# Patient Record
Sex: Male | Born: 1970
Health system: Southern US, Community
[De-identification: ages and names within clinical notes are randomized; demographics above are authoritative.]

## PROBLEM LIST (undated history)

## (undated) ENCOUNTER — Ambulatory Visit: Admission: EM | Payer: 59 | Source: Home / Self Care

## (undated) DIAGNOSIS — K5900 Constipation, unspecified: Secondary | ICD-10-CM

## (undated) DIAGNOSIS — I499 Cardiac arrhythmia, unspecified: Secondary | ICD-10-CM

## (undated) DIAGNOSIS — K4090 Unilateral inguinal hernia, without obstruction or gangrene, not specified as recurrent: Secondary | ICD-10-CM

## (undated) DIAGNOSIS — N400 Enlarged prostate without lower urinary tract symptoms: Secondary | ICD-10-CM

## (undated) DIAGNOSIS — R519 Headache, unspecified: Secondary | ICD-10-CM

## (undated) DIAGNOSIS — R51 Headache: Secondary | ICD-10-CM

## (undated) DIAGNOSIS — M419 Scoliosis, unspecified: Secondary | ICD-10-CM

## (undated) DIAGNOSIS — R300 Dysuria: Secondary | ICD-10-CM

## (undated) HISTORY — PX: HERNIA REPAIR: SHX51

## (undated) HISTORY — DX: Unilateral inguinal hernia, without obstruction or gangrene, not specified as recurrent: K40.90

## (undated) HISTORY — PX: UMBILICAL HERNIA REPAIR: SHX196

## (undated) HISTORY — PX: TONSILLECTOMY: SUR1361

---

## 1996-11-25 HISTORY — PX: HERNIA REPAIR: SHX51

## 2011-12-08 ENCOUNTER — Emergency Department: Payer: Self-pay | Admitting: Emergency Medicine

## 2012-12-19 ENCOUNTER — Emergency Department: Payer: Self-pay | Admitting: Emergency Medicine

## 2013-03-15 ENCOUNTER — Emergency Department: Payer: Self-pay | Admitting: Emergency Medicine

## 2013-03-15 LAB — BASIC METABOLIC PANEL
Anion Gap: 4 — ABNORMAL LOW (ref 7–16)
BUN: 18 mg/dL (ref 7–18)
Calcium, Total: 8.8 mg/dL (ref 8.5–10.1)
Chloride: 107 mmol/L (ref 98–107)
Co2: 28 mmol/L (ref 21–32)
Creatinine: 1.39 mg/dL — ABNORMAL HIGH (ref 0.60–1.30)
EGFR (African American): >60
EGFR (Non-African Amer.): >60
Glucose: 56 mg/dL — ABNORMAL LOW (ref 65–99)
Osmolality: 277 (ref 275–301)
Potassium: 3.9 mmol/L (ref 3.5–5.1)
Sodium: 139 mmol/L (ref 136–145)

## 2013-03-15 LAB — CBC
HCT: 48.3 % (ref 40.0–52.0)
HGB: 15.8 g/dL (ref 13.0–18.0)
MCH: 27.7 pg (ref 26.0–34.0)
MCHC: 32.7 g/dL (ref 32.0–36.0)
MCV: 85 fL (ref 80–100)
Platelet: 227 10*3/uL (ref 150–440)
RBC: 5.7 10*6/uL (ref 4.40–5.90)
RDW: 13.9 % (ref 11.5–14.5)
WBC: 6.1 10*3/uL (ref 3.8–10.6)

## 2013-03-15 LAB — CK TOTAL AND CKMB (NOT AT ARMC)
CK, Total: 203 U/L (ref 35–232)
CK-MB: 1.8 ng/mL (ref 0.5–3.6)

## 2013-03-15 LAB — TROPONIN I
Troponin-I: <0.02 ng/mL
Troponin-I: <0.02 ng/mL

## 2014-02-22 ENCOUNTER — Emergency Department: Payer: Self-pay | Admitting: Emergency Medicine

## 2014-02-22 LAB — COMPREHENSIVE METABOLIC PANEL
Albumin: 4 g/dL (ref 3.4–5.0)
Alkaline Phosphatase: 57 U/L
Anion Gap: 6 — ABNORMAL LOW (ref 7–16)
BUN: 15 mg/dL (ref 7–18)
Bilirubin,Total: 0.5 mg/dL (ref 0.2–1.0)
Calcium, Total: 8.6 mg/dL (ref 8.5–10.1)
Chloride: 106 mmol/L (ref 98–107)
Co2: 26 mmol/L (ref 21–32)
Creatinine: 1.23 mg/dL (ref 0.60–1.30)
EGFR (African American): >60
EGFR (Non-African Amer.): >60
Glucose: 83 mg/dL (ref 65–99)
Osmolality: 276 (ref 275–301)
Potassium: 3.9 mmol/L (ref 3.5–5.1)
SGOT(AST): 22 U/L (ref 15–37)
SGPT (ALT): 22 U/L (ref 12–78)
Sodium: 138 mmol/L (ref 136–145)
Total Protein: 7.6 g/dL (ref 6.4–8.2)

## 2014-02-22 LAB — URINALYSIS, COMPLETE
Bacteria: NONE SEEN
Bilirubin,UR: NEGATIVE
Blood: NEGATIVE
Glucose,UR: NEGATIVE mg/dL (ref 0–75)
Leukocyte Esterase: NEGATIVE
Nitrite: NEGATIVE
Ph: 6 (ref 4.5–8.0)
Protein: NEGATIVE
RBC,UR: NONE SEEN /HPF (ref 0–5)
Specific Gravity: 1.006 (ref 1.003–1.030)
Squamous Epithelial: NONE SEEN
WBC UR: NONE SEEN /HPF (ref 0–5)

## 2014-02-22 LAB — CBC
HCT: 48.6 % (ref 40.0–52.0)
HGB: 15.7 g/dL (ref 13.0–18.0)
MCH: 27.2 pg (ref 26.0–34.0)
MCHC: 32.2 g/dL (ref 32.0–36.0)
MCV: 84 fL (ref 80–100)
Platelet: 241 10*3/uL (ref 150–440)
RBC: 5.76 10*6/uL (ref 4.40–5.90)
RDW: 13.9 % (ref 11.5–14.5)
WBC: 5.2 10*3/uL (ref 3.8–10.6)

## 2014-09-30 ENCOUNTER — Emergency Department: Payer: Self-pay | Admitting: Emergency Medicine

## 2014-09-30 LAB — URINALYSIS, COMPLETE
Bacteria: NONE SEEN
Bilirubin,UR: NEGATIVE
Blood: NEGATIVE
Glucose,UR: NEGATIVE mg/dL (ref 0–75)
Ketone: NEGATIVE
Leukocyte Esterase: NEGATIVE
Nitrite: NEGATIVE
Ph: 6 (ref 4.5–8.0)
Protein: NEGATIVE
RBC,UR: <1 /HPF (ref 0–5)
Specific Gravity: 1.006 (ref 1.003–1.030)
Squamous Epithelial: NONE SEEN
WBC UR: NONE SEEN /HPF (ref 0–5)

## 2014-09-30 LAB — COMPREHENSIVE METABOLIC PANEL
Albumin: 3.9 g/dL (ref 3.4–5.0)
Alkaline Phosphatase: 61 U/L
Anion Gap: 6 — ABNORMAL LOW (ref 7–16)
BUN: 13 mg/dL (ref 7–18)
Bilirubin,Total: 0.4 mg/dL (ref 0.2–1.0)
Calcium, Total: 8.7 mg/dL (ref 8.5–10.1)
Chloride: 107 mmol/L (ref 98–107)
Co2: 27 mmol/L (ref 21–32)
Creatinine: 1.38 mg/dL — ABNORMAL HIGH (ref 0.60–1.30)
EGFR (African American): >60
EGFR (Non-African Amer.): 60 — ABNORMAL LOW
Glucose: 92 mg/dL (ref 65–99)
Osmolality: 279 (ref 275–301)
Potassium: 3.8 mmol/L (ref 3.5–5.1)
SGOT(AST): 21 U/L (ref 15–37)
SGPT (ALT): 24 U/L
Sodium: 140 mmol/L (ref 136–145)
Total Protein: 7.8 g/dL (ref 6.4–8.2)

## 2014-09-30 LAB — CBC WITH DIFFERENTIAL/PLATELET
Basophil #: 0 10*3/uL (ref 0.0–0.1)
Basophil %: 0.7 %
Eosinophil #: 0.1 10*3/uL (ref 0.0–0.7)
Eosinophil %: 1.5 %
HCT: 50.1 % (ref 40.0–52.0)
HGB: 16.5 g/dL (ref 13.0–18.0)
Lymphocyte #: 2.1 10*3/uL (ref 1.0–3.6)
Lymphocyte %: 40 %
MCH: 28.1 pg (ref 26.0–34.0)
MCHC: 32.8 g/dL (ref 32.0–36.0)
MCV: 86 fL (ref 80–100)
Monocyte #: 0.8 x10 3/mm (ref 0.2–1.0)
Monocyte %: 14.9 %
Neutrophil #: 2.3 10*3/uL (ref 1.4–6.5)
Neutrophil %: 42.9 %
Platelet: 253 10*3/uL (ref 150–440)
RBC: 5.85 10*6/uL (ref 4.40–5.90)
RDW: 13.4 % (ref 11.5–14.5)
WBC: 5.4 10*3/uL (ref 3.8–10.6)

## 2014-09-30 LAB — LIPASE, BLOOD: Lipase: 94 U/L (ref 73–393)

## 2014-10-01 LAB — GC/CHLAMYDIA PROBE AMP

## 2015-04-05 ENCOUNTER — Encounter: Payer: Self-pay | Admitting: *Deleted

## 2015-04-05 DIAGNOSIS — Z720 Tobacco use: Secondary | ICD-10-CM | POA: Insufficient documentation

## 2015-04-05 DIAGNOSIS — R0789 Other chest pain: Secondary | ICD-10-CM | POA: Insufficient documentation

## 2015-04-05 LAB — BASIC METABOLIC PANEL
Anion gap: 7 (ref 5–15)
BUN: 12 mg/dL (ref 6–20)
CO2: 26 mmol/L (ref 22–32)
Calcium: 9.2 mg/dL (ref 8.9–10.3)
Chloride: 108 mmol/L (ref 101–111)
Creatinine, Ser: 1.4 mg/dL — ABNORMAL HIGH (ref 0.61–1.24)
GFR calc Af Amer: >60 mL/min (ref >60)
GFR calc non Af Amer: >60 mL/min (ref >60)
Glucose, Bld: 102 mg/dL — ABNORMAL HIGH (ref 65–99)
Potassium: 3.4 mmol/L — ABNORMAL LOW (ref 3.5–5.1)
Sodium: 141 mmol/L (ref 135–145)

## 2015-04-05 LAB — CBC
HCT: 45.7 % (ref 40.0–52.0)
Hemoglobin: 15.1 g/dL (ref 13.0–18.0)
MCH: 28.3 pg (ref 26.0–34.0)
MCHC: 33 g/dL (ref 32.0–36.0)
MCV: 85.8 fL (ref 80.0–100.0)
Platelets: 213 10*3/uL (ref 150–440)
RBC: 5.33 MIL/uL (ref 4.40–5.90)
RDW: 14.2 % (ref 11.5–14.5)
WBC: 6.2 10*3/uL (ref 3.8–10.6)

## 2015-04-05 LAB — TROPONIN I: Troponin I: <0.03 ng/mL (ref <0.031)

## 2015-04-05 NOTE — ED Notes (Signed)
Pt reports intermittent chest pressure  for several months, cramping in his left hand since yesterday and left calf numbness that started today. All symptoms have been coming and going.

## 2015-04-06 ENCOUNTER — Emergency Department
Admission: EM | Admit: 2015-04-06 | Discharge: 2015-04-06 | Disposition: A | Discharge status: Home / Self Care | Payer: Self-pay | Attending: Emergency Medicine | Admitting: Emergency Medicine

## 2015-04-06 DIAGNOSIS — R079 Chest pain, unspecified: Secondary | ICD-10-CM

## 2015-04-06 DIAGNOSIS — R0789 Other chest pain: Secondary | ICD-10-CM

## 2015-04-06 NOTE — ED Provider Notes (Signed)
[facility] Emergency Department Provider Note  ____________________________________________  Time seen: 4:25 AM  I have reviewed the triage vital signs and the nursing notes.   HISTORY  Chief Complaint Chest Pain      HPI Joseph Strong is a 44 y.o. male presents with intermittent chest discomfort times several months. No chest pain at present no shortness of breath no nausea no dizziness. Patient describes the discomfort as pressure when it does occur. Current pain score 0     History reviewed. No pertinent past medical history.  There are no active problems to display for this patient.   Past Surgical History  Procedure Laterality Date  . Hernia repair      No current outpatient prescriptions on file.  Allergies Review of patient's allergies indicates no known allergies.  No family history on file.  Social History History  Substance Use Topics  . Smoking status: Current Some Day Smoker  . Smokeless tobacco: Not on file  . Alcohol Use: Yes    Review of Systems  Constitutional: Negative for fever. Eyes: Negative for visual changes. ENT: Negative for sore throat. Cardiovascular: Negative for chest pain. Respiratory: Negative for shortness of breath. Gastrointestinal: Negative for abdominal pain, vomiting and diarrhea. Genitourinary: Negative for dysuria. Musculoskeletal: Negative for back pain. Skin: Negative for rash. Neurological: Negative for headaches, focal weakness or numbness.  10-point ROS otherwise negative.  ____________________________________________   PHYSICAL EXAM:  VITAL SIGNS: ED Triage Vitals  Enc Vitals Group     BP 04/05/15 2237 139/80 mmHg     Pulse Rate 04/05/15 2237 56     Resp 04/05/15 2237 16     Temp 04/05/15 2237 97.8 F (36.6 C)     Temp Source 04/05/15 2237 Oral     SpO2 04/05/15 2237 99 %     Weight 04/05/15 2237 180 lb (81.647 kg)     Height 04/05/15 2237 6' (1.829 m)     Head Cir --       Peak Flow --      Pain Score --      Pain Loc --      Pain Edu? --      Excl. in GC? --      Constitutional: Alert and oriented. Well appearing and in no distress. Eyes: Conjunctivae are normal. PERRL. Normal extraocular movements. ENT   Head: Normocephalic and atraumatic.   Nose: No congestion/rhinnorhea.   Mouth/Throat: Mucous membranes are moist.   Neck: No stridor. Cardiovascular: Normal rate, regular rhythm. Normal and symmetric distal pulses are present in all extremities. No murmurs, rubs, or gallops. Respiratory: Normal respiratory effort without tachypnea nor retractions. Breath sounds are clear and equal bilaterally. No wheezes/rales/rhonchi. Gastrointestinal: Soft and nontender. No distention. There is no CVA tenderness. Genitourinary: deferred Musculoskeletal: Nontender with normal range of motion in all extremities. No joint effusions.  No lower extremity tenderness nor edema. Neurologic:  Normal speech and language. No gross focal neurologic deficits are appreciated. Speech is normal.  Skin:  Skin is warm, dry and intact. No rash noted. Psychiatric: Mood and affect are normal. Speech and behavior are normal. Patient exhibits appropriate insight and judgment.  ____________________________________________    LABS (pertinent positives/negatives)  Labs Reviewed  BASIC METABOLIC PANEL - Abnormal; Notable for the following:    Potassium 3.4 (*)    Glucose, Bld 102 (*)    Creatinine, Ser 1.40 (*)    All other components within normal limits  TROPONIN I  CBC  ____________________________________________   EKG   Date: 04/06/2015  Rate: 48  Rhythm: Sinus bradycardia  QRS Axis: normal  Intervals: normal  ST/T Wave abnormalities: normal  Conduction Disutrbances: none  Narrative Interpretation: unremarkable      ____________________________________________        INITIAL IMPRESSION / ASSESSMENT AND PLAN / ED COURSE  Pertinent  labs & imaging results that were available during my care of the patient were reviewed by me and considered in my medical decision making (see chart for details).  History of physical exam, lab data and EKG unremarkable except sinus bradycardia. Patient will be referred to cardiology doctor called for outpatient follow-up  ____________________________________________   FINAL CLINICAL IMPRESSION(S) / ED DIAGNOSES  Final diagnoses:  Chest pain of uncertain etiology      Darci Currentandolph N Brown, MD 04/19/15 [phone]

## 2015-04-06 NOTE — Discharge Instructions (Signed)

## 2015-04-06 NOTE — ED Notes (Signed)
Pt provided with work note as requested stating that he was seen in ED.

## 2015-04-06 NOTE — ED Notes (Signed)
MD Brown at bedside.

## 2015-08-27 ENCOUNTER — Emergency Department
Admission: EM | Admit: 2015-08-27 | Discharge: 2015-08-27 | Disposition: A | Discharge status: Home / Self Care | Payer: PRIVATE HEALTH INSURANCE | Attending: Emergency Medicine | Admitting: Emergency Medicine

## 2015-08-27 ENCOUNTER — Encounter: Payer: Self-pay | Admitting: Emergency Medicine

## 2015-08-27 DIAGNOSIS — Z720 Tobacco use: Secondary | ICD-10-CM | POA: Insufficient documentation

## 2015-08-27 DIAGNOSIS — R6889 Other general symptoms and signs: Secondary | ICD-10-CM | POA: Diagnosis present

## 2015-08-27 DIAGNOSIS — M545 Low back pain, unspecified: Secondary | ICD-10-CM

## 2015-08-27 LAB — COMPREHENSIVE METABOLIC PANEL
ALT: 18 U/L (ref 17–63)
AST: 21 U/L (ref 15–41)
Albumin: 4.1 g/dL (ref 3.5–5.0)
Alkaline Phosphatase: 63 U/L (ref 38–126)
Anion gap: 8 (ref 5–15)
BUN: 12 mg/dL (ref 6–20)
CO2: 24 mmol/L (ref 22–32)
Calcium: 8.9 mg/dL (ref 8.9–10.3)
Chloride: 108 mmol/L (ref 101–111)
Creatinine, Ser: 1.27 mg/dL — ABNORMAL HIGH (ref 0.61–1.24)
GFR calc Af Amer: >60 mL/min (ref >60)
GFR calc non Af Amer: >60 mL/min (ref >60)
Glucose, Bld: 84 mg/dL (ref 65–99)
Potassium: 3.9 mmol/L (ref 3.5–5.1)
Sodium: 140 mmol/L (ref 135–145)
Total Bilirubin: 0.7 mg/dL (ref 0.3–1.2)
Total Protein: 7.1 g/dL (ref 6.5–8.1)

## 2015-08-27 LAB — CBC
HCT: 48.5 % (ref 40.0–52.0)
Hemoglobin: 15.8 g/dL (ref 13.0–18.0)
MCH: 27.6 pg (ref 26.0–34.0)
MCHC: 32.6 g/dL (ref 32.0–36.0)
MCV: 84.6 fL (ref 80.0–100.0)
Platelets: 250 10*3/uL (ref 150–440)
RBC: 5.74 MIL/uL (ref 4.40–5.90)
RDW: 14 % (ref 11.5–14.5)
WBC: 6.1 10*3/uL (ref 3.8–10.6)

## 2015-08-27 LAB — URINALYSIS COMPLETE WITH MICROSCOPIC (ARMC ONLY)
Bilirubin Urine: NEGATIVE
Glucose, UA: NEGATIVE mg/dL
Hgb urine dipstick: NEGATIVE
Ketones, ur: NEGATIVE mg/dL
Leukocytes, UA: NEGATIVE
Nitrite: NEGATIVE
Protein, ur: NEGATIVE mg/dL
Specific Gravity, Urine: 1.02 (ref 1.005–1.030)
Squamous Epithelial / LPF: NONE SEEN
pH: 5 (ref 5.0–8.0)

## 2015-08-27 LAB — LIPASE, BLOOD: Lipase: 24 U/L (ref 22–51)

## 2015-08-27 NOTE — ED Notes (Addendum)
Pt c/o pinching and pain and pressure to the area around his kidneys. Pt states abdominal pain that moves from front to back. NAD noted at this time. Pt states he noted blood in his spit after brushing his teeth. Denies trouble going to the bathroom. Pt also c/o dizziness intermittently. Pt states mold issue where he lives and thinks that he may have been exposed to mold as hell.

## 2015-08-27 NOTE — ED Provider Notes (Signed)
[facility] Emergency Department Provider Note   ____________________________________________  Time seen: 8:30 PM I have reviewed the triage vital signs and the triage nursing note.  HISTORY  Chief Complaint Multiple Medical Complaints    Historian Patient  HPI Joseph Strong is a 44 y.o. male presents to the emergency department for intermittent sharp pains in the left flank and occasionally the right flank. He had had some episodes of these today. He is also concerned about some intermittent lightheadedness which he calls dizziness. There is no vertigo. There is no weakness or numbness. He reports that he had left inguinal hernia repair in the past and occasionally he will have a cramping pain in that area, but not now. He is reports that he now has a right inguinal hernia which does not give him any pain and is easily reducible. He's not had any chest pain or shortness of breath or trouble breathing. No nausea vomiting or abdominal pain. 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 "Patient is somewhat concerned about dust allergy and mold allergy at home. He has not seen a primary care physician but hasn't appointment in about 2 weeks with a new primary care physician.    History reviewed. No pertinent past medical history.  There are no active problems to display for this patient.   Past Surgical History  Procedure Laterality Date  . Hernia repair      No current outpatient prescriptions on file.  Allergies Review of patient's allergies indicates no known allergies.  History reviewed. No pertinent family history.  Social History Social History  Substance Use Topics  . Smoking status: Current Some Day Smoker  . Smokeless tobacco: None  . Alcohol Use: Yes    Review of Systems  Constitutional: Negative for fever. Eyes: Negative for visual changes. ENT: Negative for sore throat. Cardiovascular: Negative for chest  pain. Respiratory: Negative for shortness of breath. Gastrointestinal: Negative for abdominal pain, vomiting and diarrhea. Genitourinary: Negative for dysuria. Musculoskeletal: Negative for back pain. Skin: Negative for rash. Neurological: Negative for headache. 10 point Review of Systems otherwise negative ____________________________________________   PHYSICAL EXAM:  VITAL SIGNS: ED Triage Vitals  Enc Vitals Group     BP 08/27/15 1458 141/84 mmHg     Pulse Rate 08/27/15 1458 81     Resp 08/27/15 1458 20     Temp 08/27/15 1458 97.7 F (36.5 C)     Temp Source 08/27/15 1458 Oral     SpO2 08/27/15 1458 97 %     Weight 08/27/15 1458 180 lb (81.647 kg)     Height 08/27/15 1458 6' (1.829 m)     Head Cir --      Peak Flow --      Pain Score 08/27/15 1458 5     Pain Loc --      Pain Edu? --      Excl. in GC? --      Constitutional: Alert and oriented. Well appearing and in no distress. Eyes: Conjunctivae are normal. PERRL. Normal extraocular movements. ENT   Head: Normocephalic and atraumatic.   Nose: No congestion/rhinnorhea.   Mouth/Throat: Mucous membranes are moist.   Neck: No stridor. Cardiovascular/Chest: Normal rate, regular rhythm.  No murmurs, rubs, or gallops. Respiratory: Normal respiratory effort without tachypnea nor retractions. Breath sounds are clear and equal bilaterally. No wheezes/rales/rhonchi. Gastrointestinal: Soft. No distention, no guarding, no rebound. Nontender   Genitourinary/rectal:Deferred Musculoskeletal: Nontender with normal range of motion  in all extremities. No joint effusions.  No lower extremity tenderness.  No edema. Neurologic:  Normal speech and language. No gross or focal neurologic deficits are appreciated. Skin:  Skin is warm, dry and intact. No rash noted. Psychiatric: Mood and affect are normal. Speech and behavior are normal. Patient exhibits appropriate insight and  judgment.  ____________________________________________   EKG I, Governor Rooks, MD, the attending physician have personally viewed and interpreted all ECGs.  No EKG performed ____________________________________________  LABS (pertinent positives/negatives)  Lipase 24 Comprehensive metabolic panel significant for creatinine 1.27 otherwise within normal limits CBC within normal limits Urinalysis without significant abnormality.  ____________________________________________  RADIOLOGY All Xrays were viewed by me. Imaging interpreted by Radiologist.  None __________________________________________  PROCEDURES  Procedure(s) performed: None  Critical Care performed: None  ____________________________________________   ED COURSE / ASSESSMENT AND PLAN  CONSULTATIONS: None  Pertinent labs & imaging results that were available during my care of the patient were reviewed by me and considered in my medical decision making (see chart for details).   Patient is overall well-appearing with some intermittent complaints, and currently without complaint or pain. Patient mainly wanted to be checked for possible "kidney problems. "His creatinine is minimally elevated, his urine has rare bacteria but without any other signs of urinary tract infection. There is no blood. He is currently symptomatic. He does have a follow-up appointment scheduled with a primary care physician in about 2 weeks.  In terms of this complaint about the hernias, there is no acute complaint. He deferred examination today.  We discussed there are multiple other general screening evaluation such as vitamin D and B12, etc. they can be discussed during his primary care evaluation.  Patient / Family / Caregiver informed of clinical course, medical decision-making process, and agree with plan.   I discussed return precautions, follow-up instructions, and discharged instructions with patient and/or  family.  ___________________________________________   FINAL CLINICAL IMPRESSION(S) / ED DIAGNOSES   Final diagnoses:  Bilateral low back pain without sciatica       Governor Rooks, MD 08/27/15 2056

## 2015-08-27 NOTE — Discharge Instructions (Signed)
No certain cause was found for your intermittent back pain, however your exam and evaluation are reassuring. I suspect her back pains may be related to muscular pain.     Musculoskeletal Pain Musculoskeletal pain is muscle and boney aches and pains. These pains can occur in any part of the body. Your caregiver may treat you without knowing the cause of the pain. They may treat you if blood or urine tests, X-rays, and other tests were normal.  CAUSES There is often not a definite cause or reason for these pains. These pains may be caused by a type of germ (virus). The discomfort may also come from overuse. Overuse includes working out too hard when your body is not fit. Boney aches also come from weather changes. Bone is sensitive to atmospheric pressure changes. HOME CARE INSTRUCTIONS   Ask when your test results will be ready. Make sure you get your test results.  Only take over-the-counter or prescription medicines for pain, discomfort, or fever as directed by your caregiver. If you were given medications for your condition, do not drive, operate machinery or power tools, or sign legal documents for 24 hours. Do not drink alcohol. Do not take sleeping pills or other medications that may interfere with treatment.  Continue all activities unless the activities cause more pain. When the pain lessens, slowly resume normal activities. Gradually increase the intensity and duration of the activities or exercise.  During periods of severe pain, bed rest may be helpful. Lay or sit in any position that is comfortable.  Putting ice on the injured area.  Put ice in a bag.  Place a towel between your skin and the bag.  Leave the ice on for 15 to 20 minutes, 3 to 4 times a day.  Follow up with your caregiver for continued problems and no reason can be found for the pain. If the pain becomes worse or does not go away, it may be necessary to repeat tests or do additional testing. Your caregiver may need to  look further for a possible cause. SEEK IMMEDIATE MEDICAL CARE IF:  You have pain that is getting worse and is not relieved by medications.  You develop chest pain that is associated with shortness or breath, sweating, feeling sick to your stomach (nauseous), or throw up (vomit).  Your pain becomes localized to the abdomen.  You develop any new symptoms that seem different or that concern you. MAKE SURE YOU:   Understand these instructions.  Will watch your condition.  Will get help right away if you are not doing well or get worse. Document Released: 11/11/2005 Document Revised: 02/03/2012 Document Reviewed: 07/16/2013 [facility] Patient Information 2015 [place], [place]. This information is not intended to replace advice given to you by your health care provider. Make sure you discuss any questions you have with your health care provider. We discussed he should follow up with primary care physician as scheduled in the next 2 weeks. Return to the emergency department for any new or worsening condition including any abdominal pain, hernia which does not pushed back in place or is painful, fever, vomiting, black or bloody stools, weakness or numbness, or any other symptoms concerning to you.

## 2015-09-06 ENCOUNTER — Encounter: Payer: Self-pay | Admitting: Family Medicine

## 2015-09-06 ENCOUNTER — Ambulatory Visit (INDEPENDENT_AMBULATORY_CARE_PROVIDER_SITE_OTHER): Payer: PRIVATE HEALTH INSURANCE | Admitting: Family Medicine

## 2015-09-06 VITALS — BP 138/73 | HR 86 | Temp 98.5°F | Resp 19 | Ht 72.0 in | Wt 191.7 lb

## 2015-09-06 DIAGNOSIS — R8271 Bacteriuria: Secondary | ICD-10-CM | POA: Diagnosis not present

## 2015-09-06 DIAGNOSIS — R7989 Other specified abnormal findings of blood chemistry: Secondary | ICD-10-CM

## 2015-09-06 DIAGNOSIS — R748 Abnormal levels of other serum enzymes: Secondary | ICD-10-CM | POA: Diagnosis not present

## 2015-09-06 NOTE — Progress Notes (Signed)
Name: Joseph Strong   MRN: [id number]    DOB: 08/06/1971   Date:09/06/2015       Progress Note  Subjective  Chief Complaint  Chief Complaint  Patient presents with  . Establish Care    NP    HPI  Pt. Is here to establish care. No previous PCP. Last complete physical was 12 years ago. He was recently seen in the ER for multiple complaints. Work up including urinalysis showed some bacteria in the urine. Serum creatinine was minimally elevated at 1.27. Pt. Denies any history of kidney disease or any urinary symptoms.  Past Medical History  Diagnosis Date  . Inguinal hernia     Right side.    Past Surgical History  Procedure Laterality Date  . Tonsillectomy      childhood  . Hernia repair  1998    History reviewed. No pertinent family history.  Social History   Social History  . Marital Status: Single    Spouse Name: N/A  . Number of Children: N/A  . Years of Education: N/A   Occupational History  . Not on file.   Social History Main Topics  . Smoking status: Current Some Day Smoker    Types: Cigarettes  . Smokeless tobacco: Not on file  . Alcohol Use: 0.0 oz/week    0 Standard drinks or equivalent, 0 Glasses of wine per week     Comment: occasional  . Drug Use: No  . Sexual Activity: Yes   Other Topics Concern  . Not on file   Social History Narrative    No current outpatient prescriptions on file.  No Known Allergies   Review of Systems  Constitutional: Negative for fever, chills, weight loss and malaise/fatigue.  Gastrointestinal: Negative for abdominal pain.  Genitourinary: Negative for dysuria, urgency, frequency and hematuria.   Objective  Filed Vitals:   09/06/15 1542  BP: 138/73  Pulse: 86  Temp: 98.5 F (36.9 C)  TempSrc: Oral  Resp: 19  Height: 6' (1.829 m)  Weight: 191 lb 11.2 oz (86.955 kg)  SpO2: 98%    Physical Exam  Constitutional: He is well-developed, well-nourished, and in no distress.  Cardiovascular: Normal rate and  regular rhythm.   Pulmonary/Chest: Effort normal and breath sounds normal.  Abdominal: Soft. Bowel sounds are normal. He exhibits no distension. There is no tenderness. There is no CVA tenderness.  Nursing note and vitals reviewed.   Assessment & Plan  1. Bacteria in urine Obtain urinalysis for evaluation of bacteria in the urine. If present, will need a urine culture for identification and antibiotic susceptibility. - Urinalysis, Routine w reflex microscopic  2. Elevated serum creatinine Recheck serum creatinine. Patient encouraged to increase fluid intake before lab work. - Comprehensive Metabolic Panel (CMET)   Ennifer Harston Asad A. Faylene KurtzShah [facility] 09/06/2015 4:19 PM

## 2015-09-13 ENCOUNTER — Telehealth: Payer: Self-pay | Admitting: Family Medicine

## 2015-09-13 LAB — URINALYSIS, ROUTINE W REFLEX MICROSCOPIC
Bilirubin, UA: NEGATIVE
Glucose, UA: NEGATIVE
Ketones, UA: NEGATIVE
Leukocytes, UA: NEGATIVE
Nitrite, UA: NEGATIVE
Protein, UA: NEGATIVE
RBC, UA: NEGATIVE
Specific Gravity, UA: 1.008 (ref 1.005–1.030)
Urobilinogen, Ur: 1 mg/dL (ref 0.2–1.0)
pH, UA: 6.5 (ref 5.0–7.5)

## 2015-09-13 LAB — COMPREHENSIVE METABOLIC PANEL
ALT: 15 IU/L (ref 0–44)
AST: 18 IU/L (ref 0–40)
Albumin/Globulin Ratio: 1.7 (ref 1.1–2.5)
Albumin: 4.4 g/dL (ref 3.5–5.5)
Alkaline Phosphatase: 61 IU/L (ref 39–117)
BUN/Creatinine Ratio: 7 — ABNORMAL LOW (ref 9–20)
BUN: 8 mg/dL (ref 6–24)
Bilirubin Total: 0.2 mg/dL (ref 0.0–1.2)
CO2: 22 mmol/L (ref 18–29)
Calcium: 9.1 mg/dL (ref 8.7–10.2)
Chloride: 104 mmol/L (ref 97–106)
Creatinine, Ser: 1.17 mg/dL (ref 0.76–1.27)
GFR calc Af Amer: 87 mL/min/{1.73_m2} (ref >59)
GFR calc non Af Amer: 75 mL/min/{1.73_m2} (ref >59)
Globulin, Total: 2.6 g/dL (ref 1.5–4.5)
Glucose: 96 mg/dL (ref 65–99)
Potassium: 4.8 mmol/L (ref 3.5–5.2)
Sodium: 142 mmol/L (ref 136–144)
Total Protein: 7 g/dL (ref 6.0–8.5)

## 2015-09-13 NOTE — Telephone Encounter (Signed)
Results have been reported to patient 

## 2015-09-13 NOTE — Telephone Encounter (Signed)
Pt has returned your call.  Please call back °

## 2015-09-13 NOTE — Telephone Encounter (Signed)
Pt returned your call.  Please call him back

## 2015-09-15 ENCOUNTER — Ambulatory Visit: Payer: Self-pay | Admitting: Family Medicine

## 2015-09-20 ENCOUNTER — Encounter: Payer: Self-pay | Admitting: Family Medicine

## 2015-09-20 ENCOUNTER — Ambulatory Visit (INDEPENDENT_AMBULATORY_CARE_PROVIDER_SITE_OTHER): Payer: PRIVATE HEALTH INSURANCE | Admitting: Family Medicine

## 2015-09-20 VITALS — BP 132/69 | HR 75 | Temp 97.8°F | Resp 18 | Ht 72.0 in | Wt 188.9 lb

## 2015-09-20 DIAGNOSIS — K4091 Unilateral inguinal hernia, without obstruction or gangrene, recurrent: Secondary | ICD-10-CM | POA: Diagnosis not present

## 2015-09-20 DIAGNOSIS — Z8719 Personal history of other diseases of the digestive system: Secondary | ICD-10-CM

## 2015-09-20 DIAGNOSIS — N50812 Left testicular pain: Secondary | ICD-10-CM | POA: Insufficient documentation

## 2015-09-20 DIAGNOSIS — Z9889 Other specified postprocedural states: Secondary | ICD-10-CM

## 2015-09-20 DIAGNOSIS — Z0000 Encounter for general adult medical examination without abnormal findings: Secondary | ICD-10-CM | POA: Diagnosis not present

## 2015-09-20 DIAGNOSIS — K4090 Unilateral inguinal hernia, without obstruction or gangrene, not specified as recurrent: Secondary | ICD-10-CM | POA: Insufficient documentation

## 2015-09-20 NOTE — Progress Notes (Signed)
Name: Joseph Strong   MRN: [id number]    DOB: 21-Mar-1971   Date:09/20/2015       Progress Note  Subjective  Chief Complaint  Chief Complaint  Patient presents with  . Annual Exam    CPE    HPI  Pt. Is here for a Complete Physical Exam.   Past Medical History  Diagnosis Date  . Inguinal hernia     Right side.    Past Surgical History  Procedure Laterality Date  . Tonsillectomy      childhood  . Hernia repair  1998    History reviewed. No pertinent family history.  Social History   Social History  . Marital Status: Single    Spouse Name: N/A  . Number of Children: N/A  . Years of Education: N/A   Occupational History  . Not on file.   Social History Main Topics  . Smoking status: Current Some Day Smoker    Types: Cigarettes  . Smokeless tobacco: Not on file  . Alcohol Use: 0.0 oz/week    0 Standard drinks or equivalent, 0 Glasses of wine per week     Comment: occasional  . Drug Use: No  . Sexual Activity: Yes   Other Topics Concern  . Not on file   Social History Narrative    No current outpatient prescriptions on file.  No Known Allergies   Review of Systems  Constitutional: Negative for fever, chills, weight loss and malaise/fatigue.  HENT: Negative for congestion (occasional throat congestion) and ear pain.   Eyes: Negative for blurred vision, double vision and pain.  Respiratory: Positive for cough (occasioanl cough). Negative for shortness of breath.   Cardiovascular: Negative for chest pain, palpitations and leg swelling.  Gastrointestinal: Positive for abdominal pain. Negative for heartburn, nausea, vomiting, diarrhea, constipation and blood in stool.  Genitourinary: Negative for dysuria, frequency and hematuria.  Musculoskeletal: Negative for back pain, joint pain and neck pain.  Skin: Negative for itching and rash.  Neurological: Positive for dizziness (occasional dizziness). Negative for seizures, loss of consciousness and headaches (hx  of migraines.).  Endo/Heme/Allergies: Negative for environmental allergies.  Psychiatric/Behavioral: Negative for depression. The patient is not nervous/anxious and does not have insomnia.     Objective  Filed Vitals:   09/20/15 0930  BP: 132/69  Pulse: 75  Temp: 97.8 F (36.6 C)  TempSrc: Oral  Resp: 18  Height: 6' (1.829 m)  Weight: 188 lb 14.4 oz (85.684 kg)  SpO2: 98%    Physical Exam  Constitutional: He is oriented to person, place, and time and well-developed, well-nourished, and in no distress.  HENT:  Head: Normocephalic and atraumatic.  Eyes: Conjunctivae are normal. Pupils are equal, round, and reactive to light.  Cardiovascular: Normal rate, regular rhythm and normal heart sounds.   No murmur heard. Pulmonary/Chest: Effort normal and breath sounds normal. He has no wheezes. He has no rales.  Abdominal: Soft. Bowel sounds are normal. There is no tenderness. A hernia is present. Hernia confirmed positive in the right inguinal area.  Genitourinary: Rectum normal, prostate normal and penis normal. Rectal exam shows no tenderness. Prostate is not enlarged and not tender. He exhibits abnormal scrotal mass. He exhibits no abnormal testicular mass, no testicular tenderness and no scrotal tenderness.  Right testicle enlarged probably 2 inguinal hernia. Pt. Complains of discomfort on the left scrotal area.   Neurological: He is alert and oriented to person, place, and time.  Skin: Skin is warm, dry and intact.  Psychiatric:  Memory, affect and judgment normal.  Nursing note and vitals reviewed.   Assessment & Plan  1. Annual physical exam  - Lipid Profile - TSH - Vitamin D (25 hydroxy) - PSA  2. Testicular pain, left Right [place] [facility] is enlarged secondary to  Inguinal hernia. We will obtain testicular ultrasound to rule out any acute abnormality. - US Scrotum; Future - CT Abdomen Pelvis W Wo Contrast; Future  3. Unilateral recurrent inguinal hernia without obstruction  or gangrene Will obtain CT scan of abdomen and pelvis with and without contrast for evaluation of persistent left sided discomfort in the inguinal area at the site of previous hernia surgery. He also complains of intermittent right abdominal discomfort although abdominal exam is benign today. Follow-up for review of CT scan. - CT Abdomen Pelvis W Wo Contrast; Future  4. S/P unilateral inguinal hernia repair Persistent discomfort at the site of previous inguinal hernia repair. We will evaluate with a CT scan abdomen and pelvis. - CT Abdomen Pelvis W Wo Contrast; Future   Kashon Kraynak Asad A. Faylene Kurtz [facility] 09/20/2015 9:52 AM

## 2015-09-21 LAB — LIPID PANEL
Chol/HDL Ratio: 2.5 ratio units (ref 0.0–5.0)
Cholesterol, Total: 163 mg/dL (ref 100–199)
HDL: 66 mg/dL (ref >39)
LDL Calculated: 91 mg/dL (ref 0–99)
Triglycerides: 29 mg/dL (ref 0–149)
VLDL Cholesterol Cal: 6 mg/dL (ref 5–40)

## 2015-09-21 LAB — VITAMIN D 25 HYDROXY (VIT D DEFICIENCY, FRACTURES): Vit D, 25-Hydroxy: 32.3 ng/mL (ref 30.0–100.0)

## 2015-09-21 LAB — TSH: TSH: 0.448 u[IU]/mL — ABNORMAL LOW (ref 0.450–4.500)

## 2015-09-21 LAB — PSA: Prostate Specific Ag, Serum: 0.8 ng/mL (ref 0.0–4.0)

## 2015-09-25 ENCOUNTER — Other Ambulatory Visit: Payer: Self-pay | Admitting: Family Medicine

## 2015-09-25 DIAGNOSIS — N50812 Left testicular pain: Secondary | ICD-10-CM

## 2015-09-26 ENCOUNTER — Ambulatory Visit
Admission: RE | Admit: 2015-09-26 | Discharge: 2015-09-26 | Disposition: A | Discharge status: Home / Self Care | Payer: PRIVATE HEALTH INSURANCE | Source: Ambulatory Visit | Attending: Family Medicine | Admitting: Family Medicine

## 2015-09-26 ENCOUNTER — Encounter: Payer: Self-pay | Admitting: Emergency Medicine

## 2015-09-26 ENCOUNTER — Telehealth: Payer: Self-pay | Admitting: Family Medicine

## 2015-09-26 DIAGNOSIS — K4090 Unilateral inguinal hernia, without obstruction or gangrene, not specified as recurrent: Secondary | ICD-10-CM | POA: Insufficient documentation

## 2015-09-26 DIAGNOSIS — N50812 Left testicular pain: Secondary | ICD-10-CM | POA: Insufficient documentation

## 2015-09-26 NOTE — Telephone Encounter (Signed)
Pt would like a call back about his results.

## 2015-09-26 NOTE — Telephone Encounter (Signed)
Called patient, mailbox is full and not excepting any messages at this time

## 2015-09-26 NOTE — Patient Instructions (Signed)
Patient called back and was notified of lab results. Will call next week to make appointment to follow up on labs for TSH

## 2015-09-27 ENCOUNTER — Telehealth: Payer: Self-pay | Admitting: Emergency Medicine

## 2015-09-27 ENCOUNTER — Telehealth: Payer: Self-pay

## 2015-09-27 NOTE — Telephone Encounter (Signed)
Called pt, not able to leave a message, I have scheduled his CT for 10-03-15 at 4:00pm, arrive at 3:45pm. at [facility] ([place] location) he must be NPO for 4 hrs prior to the scan and pick up a prep kit the day before his scan at the [facility] or [facility] medical mall.

## 2015-09-27 NOTE — Telephone Encounter (Signed)
Patient notified of US results

## 2015-09-27 NOTE — Telephone Encounter (Signed)
Patient notified

## 2015-10-02 ENCOUNTER — Telehealth: Payer: Self-pay | Admitting: Family Medicine

## 2015-10-02 NOTE — Telephone Encounter (Signed)
Routed to Dr. Sherryll BurgerShah to sign order

## 2015-10-02 NOTE — Telephone Encounter (Signed)
[facility] states patient is coming tomorrow @ 4p for CT. The order was put into the system wrong, she did correct it, but is asking that Dr Sherryll BurgerShah please resign it. Thank you [phone]

## 2015-10-03 ENCOUNTER — Ambulatory Visit
Admission: RE | Admit: 2015-10-03 | Discharge: 2015-10-03 | Disposition: A | Discharge status: Home / Self Care | Payer: PRIVATE HEALTH INSURANCE | Source: Ambulatory Visit | Attending: Family Medicine | Admitting: Family Medicine

## 2015-10-03 DIAGNOSIS — Z9889 Other specified postprocedural states: Secondary | ICD-10-CM

## 2015-10-03 DIAGNOSIS — N50812 Left testicular pain: Secondary | ICD-10-CM | POA: Diagnosis present

## 2015-10-03 DIAGNOSIS — Z8719 Personal history of other diseases of the digestive system: Secondary | ICD-10-CM

## 2015-10-03 DIAGNOSIS — K5900 Constipation, unspecified: Secondary | ICD-10-CM | POA: Diagnosis not present

## 2015-10-03 DIAGNOSIS — K4091 Unilateral inguinal hernia, without obstruction or gangrene, recurrent: Secondary | ICD-10-CM

## 2015-10-03 DIAGNOSIS — K4090 Unilateral inguinal hernia, without obstruction or gangrene, not specified as recurrent: Secondary | ICD-10-CM | POA: Diagnosis not present

## 2015-10-03 MED ORDER — IOHEXOL 300 MG/ML  SOLN
100.0000 mL | Freq: Once | INTRAMUSCULAR | Status: AC | PRN
  Administered 2015-10-03: 100 mL via INTRAVENOUS

## 2015-10-10 ENCOUNTER — Telehealth: Payer: Self-pay | Admitting: Family Medicine

## 2015-10-10 NOTE — Telephone Encounter (Signed)
PT IS NEEDING RESULTS FROM XRAY LAST Tuesday.

## 2015-10-10 NOTE — Telephone Encounter (Signed)
Several attempts has been made to contact patient i have left 3 voicemail returning his call, i called again on 10/06/2015 and patient's voicemail was full, i will call again on tomorrow morning 10/11/2015

## 2015-10-31 ENCOUNTER — Telehealth: Payer: Self-pay | Admitting: *Deleted

## 2015-10-31 ENCOUNTER — Encounter: Payer: Self-pay | Admitting: *Deleted

## 2015-10-31 ENCOUNTER — Emergency Department: Payer: PRIVATE HEALTH INSURANCE

## 2015-10-31 ENCOUNTER — Encounter: Payer: Self-pay | Admitting: Family Medicine

## 2015-10-31 ENCOUNTER — Ambulatory Visit (INDEPENDENT_AMBULATORY_CARE_PROVIDER_SITE_OTHER): Payer: PRIVATE HEALTH INSURANCE | Admitting: Family Medicine

## 2015-10-31 ENCOUNTER — Emergency Department
Admission: EM | Admit: 2015-10-31 | Discharge: 2015-10-31 | Disposition: A | Discharge status: Home / Self Care | Payer: PRIVATE HEALTH INSURANCE | Attending: Emergency Medicine | Admitting: Emergency Medicine

## 2015-10-31 VITALS — BP 110/62 | HR 93 | Temp 98.1°F | Resp 16 | Ht 72.0 in | Wt 193.1 lb

## 2015-10-31 DIAGNOSIS — R0789 Other chest pain: Secondary | ICD-10-CM | POA: Insufficient documentation

## 2015-10-31 DIAGNOSIS — R42 Dizziness and giddiness: Secondary | ICD-10-CM | POA: Diagnosis not present

## 2015-10-31 DIAGNOSIS — R1030 Lower abdominal pain, unspecified: Secondary | ICD-10-CM | POA: Diagnosis not present

## 2015-10-31 DIAGNOSIS — F17210 Nicotine dependence, cigarettes, uncomplicated: Secondary | ICD-10-CM | POA: Diagnosis not present

## 2015-10-31 DIAGNOSIS — R7989 Other specified abnormal findings of blood chemistry: Secondary | ICD-10-CM | POA: Diagnosis not present

## 2015-10-31 DIAGNOSIS — M25512 Pain in left shoulder: Secondary | ICD-10-CM | POA: Insufficient documentation

## 2015-10-31 DIAGNOSIS — R9431 Abnormal electrocardiogram [ECG] [EKG]: Secondary | ICD-10-CM

## 2015-10-31 DIAGNOSIS — G8929 Other chronic pain: Secondary | ICD-10-CM | POA: Insufficient documentation

## 2015-10-31 DIAGNOSIS — R1032 Left lower quadrant pain: Secondary | ICD-10-CM

## 2015-10-31 LAB — CBC
HCT: 52.7 % — ABNORMAL HIGH (ref 40.0–52.0)
Hemoglobin: 16.8 g/dL (ref 13.0–18.0)
MCH: 27.2 pg (ref 26.0–34.0)
MCHC: 31.8 g/dL — ABNORMAL LOW (ref 32.0–36.0)
MCV: 85.5 fL (ref 80.0–100.0)
Platelets: 221 10*3/uL (ref 150–440)
RBC: 6.17 MIL/uL — ABNORMAL HIGH (ref 4.40–5.90)
RDW: 14 % (ref 11.5–14.5)
WBC: 5.2 10*3/uL (ref 3.8–10.6)

## 2015-10-31 LAB — BASIC METABOLIC PANEL
Anion gap: 6 (ref 5–15)
BUN: 11 mg/dL (ref 6–20)
CO2: 25 mmol/L (ref 22–32)
Calcium: 9.4 mg/dL (ref 8.9–10.3)
Chloride: 108 mmol/L (ref 101–111)
Creatinine, Ser: 1.23 mg/dL (ref 0.61–1.24)
GFR calc Af Amer: >60 mL/min (ref >60)
GFR calc non Af Amer: >60 mL/min (ref >60)
Glucose, Bld: 86 mg/dL (ref 65–99)
Potassium: 3.6 mmol/L (ref 3.5–5.1)
Sodium: 139 mmol/L (ref 135–145)

## 2015-10-31 LAB — TROPONIN I: Troponin I: <0.03 ng/mL (ref <0.031)

## 2015-10-31 NOTE — Telephone Encounter (Signed)
[facility] left message for patient to call the office back regarding his appointment and to see if he needs to come in earlier

## 2015-10-31 NOTE — ED Notes (Signed)
According to EMS, pt c/o left arm pain and numbness in tingling x1 week. Pt was seen at Dr. Leandro ReasonerShaws office after completing a EKG pt demonstrated ST elevation in multiple leads at 1052AM. Pt arrives to [facility] ED, speaking in complete sentences, rating left shoulder pain 7/10, and diaphoretic, denies SOB.   EMS Vitals  BP 151/91 BSG 75

## 2015-10-31 NOTE — Discharge Instructions (Signed)
Your EKG shows early repolarization. He had not had any chest discomfort for about 2 days. Your cardiac enzyme was negative. Your chest x-ray looked good. Please follow-up with your regular doctor at [facility] for ongoing care. Return to the emergency department if you have further chest pain, shortness of breath, diaphoresis, nausea, weakness, or any other urgent concerns. Nonspecific Chest Pain  Chest pain can be caused by many different conditions. There is always a chance that your pain could be related to something serious, such as a heart attack or a blood clot in your lungs. Chest pain can also be caused by conditions that are not life-threatening. If you have chest pain, it is very important to follow up with your health care provider. CAUSES  Chest pain can be caused by:  Heartburn.  Pneumonia or bronchitis.  Anxiety or stress.  Inflammation around your heart (pericarditis) or lung (pleuritis or pleurisy).  A blood clot in your lung.  A collapsed lung (pneumothorax). It can develop suddenly on its own (spontaneous pneumothorax) or from trauma to the chest.  Shingles infection (varicella-zoster virus).  Heart attack.  Damage to the bones, muscles, and cartilage that make up your chest wall. This can include:  Bruised bones due to injury.  Strained muscles or cartilage due to frequent or repeated coughing or overwork.  Fracture to one or more ribs.  Sore cartilage due to inflammation (costochondritis). RISK FACTORS  Risk factors for chest pain may include:  Activities that increase your risk for trauma or injury to your chest.  Respiratory infections or conditions that cause frequent coughing.  Medical conditions or overeating that can cause heartburn.  Heart disease or family history of heart disease.  Conditions or health behaviors that increase your risk of developing a blood clot.  Having had chicken pox (varicella zoster). SIGNS AND SYMPTOMS Chest pain can  feel like:  Burning or tingling on the surface of your chest or deep in your chest.  Crushing, pressure, aching, or squeezing pain.  Dull or sharp pain that is worse when you move, cough, or take a deep breath.  Pain that is also felt in your back, neck, shoulder, or arm, or pain that spreads to any of these areas. Your chest pain may come and go, or it may stay constant. DIAGNOSIS Lab tests or other studies may be needed to find the cause of your pain. Your health care provider may have you take a test called an ambulatory ECG (electrocardiogram). An ECG records your heartbeat patterns at the time the test is performed. You may also have other tests, such as:  Transthoracic echocardiogram (TTE). During echocardiography, sound waves are used to create a picture of all of the heart structures and to look at how blood flows through your heart.  Transesophageal echocardiogram (TEE).This is a more advanced imaging test that obtains images from inside your body. It allows your health care provider to see your heart in finer detail.  Cardiac monitoring. This allows your health care provider to monitor your heart rate and rhythm in real time.  Holter monitor. This is a portable device that records your heartbeat and can help to diagnose abnormal heartbeats. It allows your health care provider to track your heart activity for several days, if needed.  Stress tests. These can be done through exercise or by taking medicine that makes your heart beat more quickly.  Blood tests.  Imaging tests. TREATMENT  Your treatment depends on what is causing your chest pain. Treatment may  include:  Medicines. These may include:  Acid blockers for heartburn.  Anti-inflammatory medicine.  Pain medicine for inflammatory conditions.  Antibiotic medicine, if an infection is present.  Medicines to dissolve blood clots.  Medicines to treat coronary artery disease.  Supportive care for conditions that do  not require medicines. This may include:  Resting.  Applying heat or cold packs to injured areas.  Limiting activities until pain decreases. HOME CARE INSTRUCTIONS  If you were prescribed an antibiotic medicine, finish it all even if you start to feel better.  Avoid any activities that bring on chest pain.  Do not use any tobacco products, including cigarettes, chewing tobacco, or electronic cigarettes. If you need help quitting, ask your health care provider.  Do not drink alcohol.  Take medicines only as directed by your health care provider.  Keep all follow-up visits as directed by your health care provider. This is important. This includes any further testing if your chest pain does not go away.  If heartburn is the cause for your chest pain, you may be told to keep your head raised (elevated) while sleeping. This reduces the chance that acid will go from your stomach into your esophagus.  Make lifestyle changes as directed by your health care provider. These may include:  Getting regular exercise. Ask your health care provider to suggest some activities that are safe for you.  Eating a heart-healthy diet. A registered dietitian can help you to learn healthy eating options.  Maintaining a healthy weight.  Managing diabetes, if necessary.  Reducing stress. SEEK MEDICAL CARE IF:  Your chest pain does not go away after treatment.  You have a rash with blisters on your chest.  You have a fever. SEEK IMMEDIATE MEDICAL CARE IF:   Your chest pain is worse.  You have an increasing cough, or you cough up blood.  You have severe abdominal pain.  You have severe weakness.  You faint.  You have chills.  You have sudden, unexplained chest discomfort.  You have sudden, unexplained discomfort in your arms, back, neck, or jaw.  You have shortness of breath at any time.  You suddenly start to sweat, or your skin gets clammy.  You feel nauseous or you vomit.  You  suddenly feel light-headed or dizzy.  Your heart begins to beat quickly, or it feels like it is skipping beats. These symptoms may represent a serious problem that is an emergency. Do not wait to see if the symptoms will go away. Get medical help right away. Call your local emergency services (911 in the [place] Do not drive yourself to the hospital.   This information is not intended to replace advice given to you by your health care provider. Make sure you discuss any questions you have with your health care provider.   Document Released: 08/21/2005 Document Revised: 12/02/2014 Document Reviewed: 06/17/2014 Elsevier Interactive Patient Education [employer]

## 2015-10-31 NOTE — ED Provider Notes (Signed)
[facility] Emergency Department Provider Note  ____________________________________________  Time seen: 12:20 PM  I have reviewed the triage vital signs and the nursing notes.  History by:  Patient  HISTORY  Chief Complaint Abnormal ECG     HPI Joseph Strong is a 44 y.o. male who was sent to the emergency department from his primary care physician's office due to chest pain and concern for possible EKG changes. The patient reports he has had intermittent discomfort in his chest for 2-3 weeks. His is primarily in the left chest. It is brief and sometimes feels prickly. He reports he has some discomfort of course his left shoulder. Patient goes on to speak about discomfort in his left inguinal area, a history of discomfort in his left lower back, and other various symptoms.  The patient reports sometimes having heaviness in his chest. Last time he felt any of the discomfort in his chest or any heaviness was approximately 3 days ago. Today he reports he feels well, except for having some dizziness.  Patient does not have a history of any heart problems. He does not have hypertension or diabetes. He does report that his grandmother had hypertension.   Past Medical History  Diagnosis Date  . Inguinal hernia     Right side.    Patient Active Problem List   Diagnosis Date Noted  . Dizziness, nonspecific 10/31/2015  . Abnormal TSH 10/31/2015  . Left inguinal pain 10/31/2015  . Abnormal EKG 10/31/2015  . Annual physical exam 09/20/2015  . Testicular pain, left 09/20/2015  . Unilateral inguinal hernia without obstruction or gangrene 09/20/2015  . S/P unilateral inguinal hernia repair 09/20/2015  . Bacteria in urine 09/06/2015    Past Surgical History  Procedure Laterality Date  . Tonsillectomy      childhood  . Hernia repair  1998    No current outpatient prescriptions on file.  Allergies Review of patient's allergies indicates no known  allergies.  History reviewed. No pertinent family history.  Social History Social History  Substance Use Topics  . Smoking status: Current Some Day Smoker    Types: Cigarettes  . Smokeless tobacco: None  . Alcohol Use: 0.0 oz/week    0 Standard drinks or equivalent, 0 Glasses of wine per week     Comment: occasional    Review of Systems  Constitutional: Negative for fever/chills. ENT: Negative for congestion. Cardiovascular: Negative for chest pain. Respiratory: Intermittent chest pain for 2-3 weeks. None today. See history of present illness. Gastrointestinal: Negative for abdominal pain, vomiting and diarrhea. Genitourinary: Negative for dysuria. Musculoskeletal: No back pain. Skin: Negative for rash. Neurological: Negative for headache or focal weakness   10-point ROS otherwise negative.  ____________________________________________   PHYSICAL EXAM:  VITAL SIGNS: ED Triage Vitals  Enc Vitals Group     BP --      Pulse --      Resp --      Temp --      Temp src --      SpO2 --      Weight --      Height --      Head Cir --      Peak Flow --      Pain Score --      Pain Loc --      Pain Edu? --      Excl. in GC? --     Constitutional:  Alert and oriented. Well appearing and in no distress. ENT  Head: Normocephalic and atraumatic.   Nose: No congestion/rhinnorhea.       Mouth: No erythema, no swelling   Cardiovascular: Normal rate, regular rhythm, no murmur noted Respiratory:  Normal respiratory effort, no tachypnea.    Breath sounds are clear and equal bilaterally.  Gastrointestinal: Soft, no distention. Nontender Back: No muscle spasm, no tenderness, no CVA tenderness. Musculoskeletal: No deformity noted. Nontender with normal range of motion in all extremities.  No noted edema. Neurologic:  Communicative. Normal appearing spontaneous movement in all 4 extremities. No gross focal neurologic deficits are appreciated.  Skin:  Skin is warm, dry.  No rash noted. Psychiatric: Mood and affect are normal. Speech and behavior are normal.  ____________________________________________    LABS (pertinent positives/negatives)  Labs Reviewed  CBC - Abnormal; Notable for the following:    RBC 6.17 (*)    HCT 52.7 (*)    MCHC 31.8 (*)    All other components within normal limits  BASIC METABOLIC PANEL  TROPONIN I     ____________________________________________   EKG  ED ECG REPORT I, Bita Cartwright W, the attending physician, personally viewed and interpreted this ECG.   Date: 10/31/2015  EKG Time: 12:15  Rate: 50  Rhythm: Sinus bradycardia  Axis: Normal  Intervals: Normal  ST&T Change: Early repolarization with minimal ST elevation in lead 2 and V5 V6 area   ____________________________________________    RADIOLOGY  Chest x-ray: IMPRESSION: No edema or consolidation. Stable scoliosis.  ____________________________________________   PROCEDURES    ____________________________________________   INITIAL IMPRESSION / ASSESSMENT AND PLAN / ED COURSE  Pertinent labs & imaging results that were available during my care of the patient were reviewed by me and considered in my medical decision making (see chart for details).  Well-appearing, well-nourished and well-developed, 44 year old male in no acute distress. He has not had any chest pain into her 3 days. He has had intermittent and mild discomfort over the past 2-3 weeks. He was sent to the emergency department due to concerns of the primary physician at [facility] for his EKG. There is minimal ST elevation in lead 2, consistent with early repolarization. Given his well clinical appearance and no discomfort, I have very low suspicion for ischemia.    ----------------------------------------- 1:28 PM on 10/31/2015 -----------------------------------------  Patient's blood test overall normal. His hemoglobin is 16.8. Troponin is negative, chest x-ray is  negative. EKG as noted above with early repoll. Patient has not had any symptoms in a few days. My suspicion for ischemic heart disease is extremely low. I discussed this with the patient. I've asked him to follow up with his primary physician again who can arrange for an outpatient stress test if they feel is appropriate. ____________________________________________   FINAL CLINICAL IMPRESSION(S) / ED DIAGNOSES  Final diagnoses:  Discomfort in chest    resolved EKG with early repolarization.   Darien Ramus, MD 10/31/15 [phone]

## 2015-10-31 NOTE — Progress Notes (Signed)
Name: Joseph Strong   MRN: [id number]    DOB: 01/30/1971   Date:10/31/2015       Progress Note  Subjective  Chief Complaint  Chief Complaint  Patient presents with  . Thyroid Problem    Patient states he is having pain in back, left side (thinks it's kidneys). Other symptoms include dizzy spells and pain in groin and left abdominal area. CT scan and ultrasound done with no abnormal findings.    Thyroid Problem Presents for follow-up visit. Patient reports no anxiety, cold intolerance, diaphoresis, fatigue (sometime has  fatigue), palpitations, visual change, weight gain or weight loss. Constipation: occasional. Past treatments include nothing. The following procedures have not been performed: thyroidectomy. There are no known risk factors.   Dizziness This is a recurrent problem. The current episode started more than 1 month ago. The problem occurs intermittently. Associated symptoms include abdominal pain, headaches (intermittent headaches, pt. has hx of migraines) and nausea (a few times). Pertinent negatives include no chest pain, coughing, diaphoresis, fatigue (sometime has  fatigue), fever, numbness, sore throat, vertigo, visual change, vomiting or weakness. He has tried drinking (admits to not drinking enough water) for the symptoms.   Left Groin Pain Pt. Complains of long-standing, recurrent, intermittent left groin pain, hx of left inguinal hernia repair in 1998.CT scan of A/P showed surgical changes in left groin, otherwise no abnormality in left inguinal area.   Past Medical History  Diagnosis Date  . Inguinal hernia     Right side.    Past Surgical History  Procedure Laterality Date  . Tonsillectomy      childhood  . Hernia repair  1998    History reviewed. No pertinent family history.  Social History   Social History  . Marital Status: Single    Spouse Name: N/A  . Number of Children: N/A  . Years of Education: N/A   Occupational History  . Not on file.    Social History Main Topics  . Smoking status: Current Some Day Smoker    Types: Cigarettes  . Smokeless tobacco: Not on file  . Alcohol Use: 0.0 oz/week    0 Standard drinks or equivalent, 0 Glasses of wine per week     Comment: occasional  . Drug Use: No  . Sexual Activity: Yes   Other Topics Concern  . Not on file   Social History Narrative    No current outpatient prescriptions on file.  No Known Allergies   Review of Systems  Constitutional: Negative for fever, weight loss, weight gain, diaphoresis and fatigue (sometime has  fatigue).  HENT: Negative for sore throat.   Eyes: Negative for blurred vision and double vision.  Respiratory: Negative for cough and shortness of breath.   Cardiovascular: Negative for chest pain, palpitations and leg swelling.  Gastrointestinal: Positive for nausea (a few times) and abdominal pain. Negative for vomiting. Constipation: occasional.  Genitourinary: Negative for dysuria, frequency and hematuria.  Neurological: Positive for dizziness and headaches (intermittent headaches, pt. has hx of migraines). Negative for vertigo, seizures, loss of consciousness, weakness and numbness.  Endo/Heme/Allergies: Negative for cold intolerance.  Psychiatric/Behavioral: The patient is not nervous/anxious and does not have insomnia.     Objective  Filed Vitals:   10/31/15 0948  BP: 110/62  Pulse: 93  Temp: 98.1 F (36.7 C)  TempSrc: Oral  Resp: 16  Height: 6' (1.829 m)  Weight: 193 lb 1.6 oz (87.59 kg)  SpO2: 98%    Physical Exam  Constitutional: He is oriented  to person, place, and time and well-developed, well-nourished, and in no distress.  HENT:  Head: Normocephalic and atraumatic.  Right Ear: Tympanic membrane and ear canal normal.  Left ear cerumen impaction, unable to visualize TM  Cardiovascular: Normal rate, regular rhythm and normal heart sounds.   No murmur heard. Pulmonary/Chest: Effort normal and breath sounds normal.   Abdominal: Soft. Bowel sounds are normal.  Neurological: He is alert and oriented to person, place, and time. He has intact cranial nerves. Gait normal.  Psychiatric: Mood, memory, affect and judgment normal.  Nursing note and vitals reviewed.   Assessment & Plan  1. Dizzinesses Intermittent dizziness. Will obtain EKG and Head CT - EKG 12-Lead - CT Head Wo Contrast; Future  2. Abnormal TSH TSH abnormally suppressed, recheck with complete TFTs. - TSH - T4, free - T3 Uptake  3. Left inguinal pain CT of A/P shows surgical changes in left inguinal area, patient status post left inguinal hernia repair. Referral to Gen. surgery for further evaluation of left inguinal pain - Ambulatory referral to General Surgery  4. Abnormal EKG Referral to [facility] ER for EKG changes including ST elevation in multiple leads. Patient has no chest pain at this time. He will proceed to the ER via EMS   Ritu Gagliardo Asad [facility] 10/31/2015 10:10 AM

## 2015-11-01 NOTE — Telephone Encounter (Signed)
Patient called back and is now scheduled to come in.

## 2015-11-07 ENCOUNTER — Ambulatory Visit: Payer: Self-pay | Admitting: General Surgery

## 2015-11-09 ENCOUNTER — Ambulatory Visit: Payer: Self-pay | Admitting: General Surgery

## 2015-11-21 ENCOUNTER — Ambulatory Visit (INDEPENDENT_AMBULATORY_CARE_PROVIDER_SITE_OTHER): Payer: PRIVATE HEALTH INSURANCE | Admitting: General Surgery

## 2015-11-21 ENCOUNTER — Encounter: Payer: Self-pay | Admitting: General Surgery

## 2015-11-21 VITALS — BP 134/84 | HR 62 | Resp 14 | Ht 72.0 in | Wt 197.0 lb

## 2015-11-21 DIAGNOSIS — K4090 Unilateral inguinal hernia, without obstruction or gangrene, not specified as recurrent: Secondary | ICD-10-CM | POA: Diagnosis not present

## 2015-11-21 DIAGNOSIS — R1032 Left lower quadrant pain: Secondary | ICD-10-CM

## 2015-11-21 DIAGNOSIS — R1030 Lower abdominal pain, unspecified: Secondary | ICD-10-CM

## 2015-11-21 NOTE — Patient Instructions (Addendum)
May use an ant-iinflammatory and heating pad for the local left groin pain  Inguinal Hernia, Adult Muscles help keep everything in the body in its proper place. But if a weak spot in the muscles develops, something can poke through. That is called a hernia. When this happens in the lower part of the belly (abdomen), it is called an inguinal hernia. (It takes its name from a part of the body in this region called the inguinal canal.) A weak spot in the wall of muscles lets some fat or part of the small intestine bulge through. An inguinal hernia can develop at any age. Men get them more often than women. CAUSES  In adults, an inguinal hernia develops over time.  It can be triggered by:  Suddenly straining the muscles of the lower abdomen.  Lifting heavy objects.  Straining to have a bowel movement. Difficult bowel movements (constipation) can lead to this.  Constant coughing. This may be caused by smoking or lung disease.  Being overweight.  Being pregnant.  Working at a job that requires long periods of standing or heavy lifting.  Having had an inguinal hernia before. One type can be an emergency situation. It is called a strangulated inguinal hernia. It develops if part of the small intestine slips through the weak spot and cannot get back into the abdomen. The blood supply can be cut off. If that happens, part of the intestine may die. This situation requires emergency surgery. SYMPTOMS  Often, a small inguinal hernia has no symptoms. It is found when a healthcare provider does a physical exam. Larger hernias usually have symptoms.   In adults, symptoms may include:  A lump in the groin. This is easier to see when the person is standing. It might disappear when lying down.  In men, a lump in the scrotum.  Pain or burning in the groin. This occurs especially when lifting, straining or coughing.  A dull ache or feeling of pressure in the groin.  Signs of a strangulated hernia  can include:  A bulge in the groin that becomes very painful and tender to the touch.  A bulge that turns red or purple.  Fever, nausea and vomiting.  Inability to have a bowel movement or to pass gas. DIAGNOSIS  To decide if you have an inguinal hernia, a healthcare provider will probably do a physical examination.  This will include asking questions about any symptoms you have noticed.  The healthcare provider might feel the groin area and ask you to cough. If an inguinal hernia is felt, the healthcare provider may try to slide it back into the abdomen.  Usually no other tests are needed. TREATMENT  Treatments can vary. The size of the hernia makes a difference. Options include:  Watchful waiting. This is often suggested if the hernia is small and you have had no symptoms.  No medical procedure will be done unless symptoms develop.  You will need to watch closely for symptoms. If any occur, contact your healthcare provider right away.  Surgery. This is used if the hernia is larger or you have symptoms.  Open surgery. This is usually an outpatient procedure (you will not stay overnight in a hospital). An cut (incision) is made through the skin in the groin. The hernia is put back inside the abdomen. The weak area in the muscles is then repaired by herniorrhaphy or hernioplasty. Herniorrhaphy: in this type of surgery, the weak muscles are sewn back together. Hernioplasty: a patch or  mesh is used to close the weak area in the abdominal wall.  Laparoscopy. In this procedure, a surgeon makes small incisions. A thin tube with a tiny video camera (called a laparoscope) is put into the abdomen. The surgeon repairs the hernia with mesh by looking with the video camera and using two long instruments. HOME CARE INSTRUCTIONS   After surgery to repair an inguinal hernia:  You will need to take pain medicine prescribed by your healthcare provider. Follow all directions carefully.  You will  need to take care of the wound from the incision.  Your activity will be restricted for awhile. This will probably include no heavy lifting for several weeks. You also should not do anything too active for a few weeks. When you can return to work will depend on the type of job that you have.  During "watchful waiting" periods, you should:  Maintain a healthy weight.  Eat a diet high in fiber (fruits, vegetables and whole grains).  Drink plenty of fluids to avoid constipation. This means drinking enough water and other liquids to keep your urine clear or pale yellow.  Do not lift heavy objects.  Do not stand for long periods of time.  Quit smoking. This should keep you from developing a frequent cough. SEEK MEDICAL CARE IF:   A bulge develops in your groin area.  You feel pain, a burning sensation or pressure in the groin. This might be worse if you are lifting or straining.  You develop a fever of more than 100.5 F (38.1 C). SEEK IMMEDIATE MEDICAL CARE IF:   Pain in the groin increases suddenly.  A bulge in the groin gets bigger suddenly and does not go down.  For men, there is sudden pain in the scrotum. Or, the size of the scrotum increases.  A bulge in the groin area becomes red or purple and is painful to touch.  You have nausea or vomiting that does not go away.  You feel your heart beating much faster than normal.  You cannot have a bowel movement or pass gas.  You develop a fever of more than 102.0 F (38.9 C).   This information is not intended to replace advice given to you by your health care provider. Make sure you discuss any questions you have with your health care provider.   Document Released: 03/30/2009 Document Revised: 02/03/2012 Document Reviewed: 05/15/2015 Elsevier Interactive Patient Education [employer]   Patient was given possible surgery dates. He requests to check his schedule and call back to arrange a surgery date.

## 2015-11-21 NOTE — Progress Notes (Signed)
Patient ID: Joseph Strong, male   DOB: 12/26/1970, 44 y.o.   MRN: [id number]  Chief Complaint  Patient presents with  . Pain    left inguinal area    HPI Joseph Strong is a 44 y.o. male.  Here today for evaluation of left inguinal pain. He states the area is described as a "burning" pain for about three months. Had an ultrasound done on 10/03/15 and ct scan done on 10/31/15. On the right inguinal area he states there is an area that pops in and out. He does occasionally notice some back pain as well. I have reviewed the history of present illness with the patient. HPI  Past Medical History  Diagnosis Date  . Inguinal hernia     left     Past Surgical History  Procedure Laterality Date  . Tonsillectomy      childhood  . Hernia repair Left 1998    Family History  Problem Relation Age of Onset  . Hypertension Maternal Grandmother     Social History Social History  Substance Use Topics  . Smoking status: Current Some Day Smoker    Types: Cigarettes  . Smokeless tobacco: Never Used  . Alcohol Use: 0.0 oz/week    0 Glasses of wine, 0 Standard drinks or equivalent per week     Comment: occasional    No Known Allergies  No current outpatient prescriptions on file.   No current facility-administered medications for this visit.    Review of Systems Review of Systems  Constitutional: Negative.   Respiratory: Negative.   Cardiovascular: Negative.   Gastrointestinal: Positive for constipation. Negative for nausea, vomiting and diarrhea.    Blood pressure 134/84, pulse 62, resp. rate 14, height 6' (1.829 m), weight 197 lb (89.359 kg).  Physical Exam Physical Exam  Constitutional: He is oriented to person, place, and time. He appears well-developed and well-nourished.  HENT:  Mouth/Throat: Oropharynx is clear and moist.  Eyes: Conjunctivae are normal. No scleral icterus.  Neck: Neck supple.  Cardiovascular: Normal rate, regular rhythm and normal heart sounds.    Pulmonary/Chest: Effort normal and breath sounds normal.  Abdominal: Soft. Normal appearance and bowel sounds are normal. There is no tenderness. A hernia is present. Hernia confirmed positive in the right inguinal area.  Large right hernia descending into scrotum.  Lymphadenopathy:    He has no cervical adenopathy.  Neurological: He is alert and oriented to person, place, and time.  Skin: Skin is warm and dry.  Psychiatric: His behavior is normal.    Data Reviewed CT scan- Large right inguinal hernia well into scrotum, containing loops of bowel. No hernia noted in left groin  Assessment    Large right inguinal hernia. This requires repair given the large size, and involved bowel loops.    Left groin pain- no apparent findings except mild tenderness over left pubis-possible symphysitis. Discussed in full with pt. He ia sagreeable to repair of right inguinal hernia Plan    May use an ant-iinflammatory and heating pad for the local left groin pain.   Hernia precautions and incarceration were discussed with the patient. If they develop symptoms of an incarcerated hernia, they were encouraged to seek prompt medical attention.  I have recommended repair of the hernia using mesh on an outpatient basis in the near future. The risk of infection was reviewed. The role of prosthetic mesh to minimize the risk of recurrence was reviewed.  Patient was given possible surgery dates. He requests to check his schedule  and call back to arrange a surgery date.      This information has been scribed by Ples Specter CMA.   PCP:  Carnella Guadalajara G 11/21/2015, 10:54 AM

## 2015-11-22 LAB — T4, FREE: Free T4: 1.19 ng/dL (ref 0.82–1.77)

## 2015-11-22 LAB — TSH: TSH: 0.778 u[IU]/mL (ref 0.450–4.500)

## 2015-11-22 LAB — T3 UPTAKE: T3 Uptake Ratio: 31 % (ref 24–39)

## 2015-11-24 ENCOUNTER — Telehealth: Payer: Self-pay | Admitting: Family Medicine

## 2015-11-24 NOTE — Telephone Encounter (Signed)
Results have been reported to patient 

## 2015-11-24 NOTE — Telephone Encounter (Signed)
Pt would like his lab results. Pt states if he does not answer it is ok to leave it on his VM.

## 2015-12-08 ENCOUNTER — Ambulatory Visit (INDEPENDENT_AMBULATORY_CARE_PROVIDER_SITE_OTHER): Payer: PRIVATE HEALTH INSURANCE | Admitting: Family Medicine

## 2015-12-08 ENCOUNTER — Encounter: Payer: Self-pay | Admitting: Family Medicine

## 2015-12-08 VITALS — BP 138/71 | HR 65 | Temp 97.6°F | Resp 17 | Ht 72.0 in | Wt 195.6 lb

## 2015-12-08 DIAGNOSIS — M5489 Other dorsalgia: Secondary | ICD-10-CM | POA: Insufficient documentation

## 2015-12-08 DIAGNOSIS — Z23 Encounter for immunization: Secondary | ICD-10-CM | POA: Diagnosis not present

## 2015-12-08 MED ORDER — TIZANIDINE HCL 2 MG PO CAPS: 2.0000 mg | ORAL_CAPSULE | Freq: Three times a day (TID) | ORAL | Status: DC

## 2015-12-08 MED ORDER — CELECOXIB 100 MG PO CAPS: 100.0000 mg | ORAL_CAPSULE | Freq: Two times a day (BID) | ORAL | Status: DC

## 2015-12-08 NOTE — Progress Notes (Signed)
Name: Joseph Strong   MRN: [id number]    DOB: 09-08-1971   Date:12/08/2015       Progress Note  Subjective  Chief Complaint  Chief Complaint  Patient presents with  . Follow-up    Discuss Possible MRI    Back Pain This is a recurrent problem. Episode onset: present for over 5-6 months. The problem occurs intermittently. The pain is present in the lumbar spine and thoracic spine (present around thoracic and lumbar spine in the paraspinal muscle). Quality: sharp. The pain does not radiate. The pain is at a severity of 5/10. The pain is moderate. The symptoms are aggravated by bending (sometimes feels the pain when he eats/drinks ). Pertinent negatives include no bladder incontinence, bowel incontinence, chest pain, leg pain or numbness. He has tried nothing for the symptoms.    Past Medical History  Diagnosis Date  . Inguinal hernia     left     Past Surgical History  Procedure Laterality Date  . Tonsillectomy      childhood  . Hernia repair Left 1998    Family History  Problem Relation Age of Onset  . Hypertension Maternal Grandmother     Social History   Social History  . Marital Status: Single    Spouse Name: N/A  . Number of Children: N/A  . Years of Education: N/A   Occupational History  . Not on file.   Social History Main Topics  . Smoking status: Current Some Day Smoker    Types: Cigarettes  . Smokeless tobacco: Never Used  . Alcohol Use: 0.0 oz/week    0 Glasses of wine, 0 Standard drinks or equivalent per week     Comment: occasional  . Drug Use: No  . Sexual Activity: Yes   Other Topics Concern  . Not on file   Social History Narrative    No current outpatient prescriptions on file.  No Known Allergies   Review of Systems  Cardiovascular: Negative for chest pain.  Gastrointestinal: Negative for bowel incontinence.  Genitourinary: Negative for bladder incontinence.  Musculoskeletal: Positive for back pain.  Neurological: Negative for  numbness.      Objective  Filed Vitals:   12/08/15 0835  BP: 138/71  Pulse: 65  Temp: 97.6 F (36.4 C)  TempSrc: Oral  Resp: 17  Height: 6' (1.829 m)  Weight: 195 lb 9.6 oz (88.724 kg)  SpO2: 99%    Physical Exam  Constitutional: He is well-developed, well-nourished, and in no distress.  Musculoskeletal:       Thoracic back: He exhibits no tenderness, no pain and no spasm.       Back:  Pain felt more on the left side from the middle thoracic to upper lumbar spine  Nursing note and vitals reviewed.    Assessment & Plan  1. Need for immunization against influenza  - Flu Vaccine QUAD 36+ mos PF IM (Fluarix & Fluzone Quad PF)  2. Left paraspinal back pain Intermittent pain felt in the paraspinal muscles of thoracic and lumbar spine. We will start Celebrex and obtain x-rays of both thoracic and lumbar spine for evaluation. - DG Lumbar Spine Complete; Future - DG Thoracic Spine W/Swimmers; Future - tizanidine (ZANAFLEX) 2 MG capsule; Take 1 capsule (2 mg total) by mouth 3 (three) times daily.  Dispense: 21 capsule; Refill: 0 - celecoxib (CELEBREX) 100 MG capsule; Take 1 capsule (100 mg total) by mouth 2 (two) times daily.  Dispense: 30 capsule; Refill: 0   Joseph Strong  [facility] 12/08/2015 8:50 AM

## 2015-12-28 ENCOUNTER — Other Ambulatory Visit: Payer: Self-pay | Admitting: General Surgery

## 2015-12-28 ENCOUNTER — Telehealth: Payer: Self-pay | Admitting: *Deleted

## 2015-12-28 DIAGNOSIS — K4090 Unilateral inguinal hernia, without obstruction or gangrene, not specified as recurrent: Secondary | ICD-10-CM

## 2015-12-28 NOTE — Telephone Encounter (Signed)
Patient's surgery has been scheduled for 01-12-16 at [facility].

## 2016-01-03 ENCOUNTER — Other Ambulatory Visit: Payer: PRIVATE HEALTH INSURANCE

## 2016-01-04 ENCOUNTER — Encounter: Payer: Self-pay | Admitting: General Surgery

## 2016-01-04 ENCOUNTER — Ambulatory Visit (INDEPENDENT_AMBULATORY_CARE_PROVIDER_SITE_OTHER): Payer: PRIVATE HEALTH INSURANCE | Admitting: General Surgery

## 2016-01-04 VITALS — BP 140/82 | HR 74 | Resp 14 | Ht 72.0 in | Wt 189.0 lb

## 2016-01-04 DIAGNOSIS — K4090 Unilateral inguinal hernia, without obstruction or gangrene, not specified as recurrent: Secondary | ICD-10-CM

## 2016-01-04 MED ORDER — SODIUM CHLORIDE FLUSH 0.9 % IV SOLN
INTRAVENOUS | Status: AC
Start: 2016-01-04 — End: 2016-01-05
  Filled 2016-01-04: qty 10

## 2016-01-04 NOTE — Patient Instructions (Addendum)
Patient aware to call with any questions or concerns.  Patient's surgery has been moved up from 01-12-16 to 01-11-16 at [facility]

## 2016-01-04 NOTE — Progress Notes (Signed)
Patient ID: Joseph Strong, male   DOB: 1971-09-27, 45 y.o.   MRN: [id number]  Chief Complaint  Patient presents with  . Pre-op Exam    right inguinal hernia    HPI Joseph Strong is a 45 y.o. male here today for his pre-op right inguinal hernia scheduled on 01/12/16. No recent changes to his hernia since CT scan. I have reviewed the history of present illness with the patient.  HPI  Past Medical History  Diagnosis Date  . Inguinal hernia     left     Past Surgical History  Procedure Laterality Date  . Tonsillectomy      childhood  . Hernia repair Left 1998    Family History  Problem Relation Age of Onset  . Hypertension Maternal Grandmother     Social History Social History  Substance Use Topics  . Smoking status: Current Some Day Smoker    Types: Cigarettes  . Smokeless tobacco: Never Used  . Alcohol Use: 0.0 oz/week    0 Glasses of wine, 0 Standard drinks or equivalent per week     Comment: occasional    No Known Allergies  Current Outpatient Prescriptions  Medication Sig Dispense Refill  . celecoxib (CELEBREX) 100 MG capsule Take 1 capsule (100 mg total) by mouth 2 (two) times daily. (Patient not taking: Reported on 01/04/2016) 30 capsule 0  . tizanidine (ZANAFLEX) 2 MG capsule Take 1 capsule (2 mg total) by mouth 3 (three) times daily. (Patient not taking: Reported on 01/04/2016) 21 capsule 0   No current facility-administered medications for this visit.   Facility-Administered Medications Ordered in Other Visits  Medication Dose Route Frequency Provider Last Rate Last Dose  . sodium chloride flush 0.9 % injection             Review of Systems Review of Systems  Constitutional: Negative.   Respiratory: Negative.   Cardiovascular: Negative.     Blood pressure 140/82, pulse 74, resp. rate 14, height 6' (1.829 m), weight 189 lb (85.73 kg).  Physical Exam Physical Exam  Constitutional: He is oriented to person, place, and time. He appears well-developed and  well-nourished.  Eyes: Conjunctivae are normal. No scleral icterus.  Neck: Neck supple.  Cardiovascular: Normal rate, regular rhythm and normal heart sounds.   Pulmonary/Chest: Effort normal and breath sounds normal.  Abdominal: Soft. Normal appearance and bowel sounds are normal. There is no hepatomegaly. There is no tenderness. A hernia is present. Hernia confirmed positive in the right inguinal area (Reducible, traveling into scrotum). Hernia confirmed negative in the ventral area and confirmed negative in the left inguinal area.    Lymphadenopathy:    He has no cervical adenopathy.  Neurological: He is alert and oriented to person, place, and time.  Skin: Skin is warm and dry.    Data Reviewed Notes reviewed.  Assessment    Right inguinal hernia involving bowel loops seen on CT. No changes.    Plan    Proceed with repair of right inguinal hernia.     Hernia precautions and incarceration were discussed with the patient. If they develop symptoms of an incarcerated hernia, they were encouraged to seek prompt medical attention.  I have recommended repair of the hernia using mesh on an outpatient basis in the near future. The risk of infection was reviewed. The role of prosthetic mesh to minimize the risk of recurrence was reviewed.  Patient's surgery has been moved up from 01-12-16 to 01-11-16 at [facility].  PCP:  Brayton El  This information has been scribed by Ples Specter CMA.   Trica Usery G 01/04/2016, 3:01 PM

## 2016-01-10 ENCOUNTER — Ambulatory Visit (INDEPENDENT_AMBULATORY_CARE_PROVIDER_SITE_OTHER): Payer: PRIVATE HEALTH INSURANCE | Admitting: Family Medicine

## 2016-01-10 ENCOUNTER — Encounter: Payer: Self-pay | Admitting: *Deleted

## 2016-01-10 ENCOUNTER — Encounter: Payer: Self-pay | Admitting: Family Medicine

## 2016-01-10 VITALS — BP 137/73 | HR 95 | Temp 97.5°F | Resp 20 | Ht 72.0 in | Wt 187.4 lb

## 2016-01-10 DIAGNOSIS — R194 Change in bowel habit: Secondary | ICD-10-CM | POA: Diagnosis not present

## 2016-01-10 DIAGNOSIS — R300 Dysuria: Secondary | ICD-10-CM

## 2016-01-10 HISTORY — DX: Dysuria: R30.0

## 2016-01-10 MED ORDER — POLYETHYLENE GLYCOL 3350 17 G PO PACK: 17.0000 g | PACK | Freq: Every day | ORAL | Status: DC

## 2016-01-10 NOTE — Progress Notes (Signed)
Name: Joseph Strong   MRN: [id number]    DOB: 16-Feb-1971   Date:01/10/2016       Progress Note  Subjective  Chief Complaint  Chief Complaint  Patient presents with  . Follow-up    Discuss surgery    HPI  Pt. Presents to discuss his surgery for left sided inguinal hernia repair, scheduled for tomorrow 01/11/16. Reports he has not had a BM since Monday (2 days ago). He usually has a BM every day but for the last 7-8 days, it has been irregular, having gone only 3-4 times. He is wondering if the constipation is secondary to his Right sided inguinal hernia. No abdominal pain except for chronic right-sided lower abdomen and pelvic discomfort secondary to hernia surgery. No nausea, vomiting, fevers, chills, or other concerning symptoms  Past Medical History  Diagnosis Date  . Inguinal hernia     left     Past Surgical History  Procedure Laterality Date  . Tonsillectomy      childhood  . Hernia repair Left 1998    Family History  Problem Relation Age of Onset  . Hypertension Maternal Grandmother     Social History   Social History  . Marital Status: Single    Spouse Name: N/A  . Number of Children: N/A  . Years of Education: N/A   Occupational History  . Not on file.   Social History Main Topics  . Smoking status: Current Some Day Smoker    Types: Cigarettes  . Smokeless tobacco: Never Used  . Alcohol Use: 0.0 oz/week    0 Glasses of wine, 0 Standard drinks or equivalent per week     Comment: occasional  . Drug Use: No  . Sexual Activity: Yes   Other Topics Concern  . Not on file   Social History Narrative     Current outpatient prescriptions:  .  celecoxib (CELEBREX) 100 MG capsule, Take 1 capsule (100 mg total) by mouth 2 (two) times daily. (Patient not taking: Reported on 01/10/2016), Disp: 30 capsule, Rfl: 0 .  tizanidine (ZANAFLEX) 2 MG capsule, Take 1 capsule (2 mg total) by mouth 3 (three) times daily. (Patient not taking: Reported on 01/10/2016), Disp: 21  capsule, Rfl: 0  No Known Allergies   Review of Systems  Constitutional: Positive for fever (has been having off and on 'light fevers', temp is normal in office.).  Gastrointestinal: Negative for abdominal pain (has occasional gas pain).     Objective  Filed Vitals:   01/10/16 0908  BP: 137/73  Pulse: 95  Temp: 97.5 F (36.4 C)  TempSrc: Oral  Resp: 20  Height: 6' (1.829 m)  Weight: 187 lb 6.4 oz (85.004 kg)  SpO2: 98%    Physical Exam  Constitutional: He is oriented to person, place, and time and well-developed, well-nourished, and in no distress.  Abdominal: Soft. Bowel sounds are normal. There is no tenderness.  Neurological: He is alert and oriented to person, place, and time.  Nursing note and vitals reviewed.     Assessment & Plan  1. Decreased frequency of bowel movements Likely constipation, acute in nature. We will treat with MiraLAX for now and reassess after hernia repair - polyethylene glycol (MIRALAX / GLYCOLAX) packet; Take 17 g by mouth daily. 1 capful/day, dissolve in 8 oz of liquid.  Dispense: 14 each; Refill: 0   Dominico Rod Asad A. Faylene Kurtz [facility] 01/10/2016 9:16 AM

## 2016-01-10 NOTE — Pre-Procedure Instructions (Signed)
DOB SSN     Joseph Strong, Joseph Strong Male [birth year] [id number]    ED Provider Notes by Darien Ramus, MD at 10/31/2015 12:09 PM    Author: Darien Ramus, MD Service: (none) Author Type: Physician   Filed: 10/31/2015 1:31 PM Note Time: 10/31/2015 12:09 PM Status: Signed   Editor: Darien Ramus, MD (Physician)     Expand All Collapse All   [facility] Emergency Department Provider Note  ____________________________________________  Time seen: 12:20 PM  I have reviewed the triage vital signs and the nursing notes.  History by: Patient  HISTORY  Chief Complaint Abnormal ECG     HPI Joseph Strong is a 45 y.o. male who was sent to the emergency department from his primary care physician's office due to chest pain and concern for possible EKG changes. The patient reports he has had intermittent discomfort in his chest for 2-3 weeks. His is primarily in the left chest. It is brief and sometimes feels prickly. He reports he has some discomfort of course his left shoulder. Patient goes on to speak about discomfort in his left inguinal area, a history of discomfort in his left lower back, and other various symptoms.  The patient reports sometimes having heaviness in his chest. Last time he felt any of the discomfort in his chest or any heaviness was approximately 3 days ago. Today he reports he feels well, except for having some dizziness.  Patient does not have a history of any heart problems. He does not have hypertension or diabetes. He does report that his grandmother had hypertension.   Past Medical History  Diagnosis Date  . Inguinal hernia     Right side.    Patient Active Problem List   Diagnosis Date Noted  . Dizziness, nonspecific 10/31/2015  . Abnormal TSH 10/31/2015  . Left inguinal pain 10/31/2015  . Abnormal EKG 10/31/2015  . Annual physical exam 09/20/2015  . Testicular pain, left 09/20/2015  .  Unilateral inguinal hernia without obstruction or gangrene 09/20/2015  . S/P unilateral inguinal hernia repair 09/20/2015  . Bacteria in urine 09/06/2015    Past Surgical History  Procedure Laterality Date  . Tonsillectomy      childhood  . Hernia repair  1998    No current outpatient prescriptions on file.  Allergies Review of patient's allergies indicates no known allergies.  History reviewed. No pertinent family history.  Social History Social History  Substance Use Topics  . Smoking status: Current Some Day Smoker    Types: Cigarettes  . Smokeless tobacco: None  . Alcohol Use: 0.0 oz/week    0 Standard drinks or equivalent, 0 Glasses of wine per week     Comment: occasional    Review of Systems  Constitutional: Negative for fever/chills. ENT: Negative for congestion. Cardiovascular: Negative for chest pain. Respiratory: Intermittent chest pain for 2-3 weeks. None today. See history of present illness. Gastrointestinal: Negative for abdominal pain, vomiting and diarrhea. Genitourinary: Negative for dysuria. Musculoskeletal: No back pain. Skin: Negative for rash. Neurological: Negative for headache or focal weakness   10-point ROS otherwise negative.  ____________________________________________   PHYSICAL EXAM:  VITAL SIGNS: ED Triage Vitals  Enc Vitals Group   BP --    Pulse --    Resp --    Temp --    Temp src --    SpO2 --    Weight --    Height --    Head Cir --    Peak Flow --  Pain Score --    Pain Loc --    Pain Edu? --    Excl. in GC? --     Constitutional: Alert and oriented. Well appearing and in no distress. ENT   Head: Normocephalic and atraumatic.   Nose: No congestion/rhinnorhea.   Mouth: No erythema, no swelling  Cardiovascular: Normal rate, regular rhythm, no murmur noted Respiratory:  Normal respiratory effort, no tachypnea.  Breath sounds are clear and equal bilaterally.  Gastrointestinal: Soft, no distention. Nontender Back: No muscle spasm, no tenderness, no CVA tenderness. Musculoskeletal: No deformity noted. Nontender with normal range of motion in all extremities. No noted edema. Neurologic: Communicative. Normal appearing spontaneous movement in all 4 extremities. No gross focal neurologic deficits are appreciated.  Skin: Skin is warm, dry. No rash noted. Psychiatric: Mood and affect are normal. Speech and behavior are normal.  ____________________________________________   LABS (pertinent positives/negatives)  Labs Reviewed  CBC - Abnormal; Notable for the following:    RBC 6.17 (*)    HCT 52.7 (*)    MCHC 31.8 (*)    All other components within normal limits  BASIC METABOLIC PANEL  TROPONIN I     ____________________________________________   EKG  ED ECG REPORT I, KAMINSKI,DAVID W, the attending physician, personally viewed and interpreted this ECG.  Date: 10/31/2015 EKG Time: 12:15 Rate: 50 Rhythm: Sinus bradycardia Axis: Normal Intervals: Normal ST&T Change: Early repolarization with minimal ST elevation in lead 2 and V5 V6 area   ____________________________________________   RADIOLOGY  Chest x-ray: IMPRESSION: No edema or consolidation. Stable scoliosis.  ____________________________________________   PROCEDURES    ____________________________________________   INITIAL IMPRESSION / ASSESSMENT AND PLAN / ED COURSE  Pertinent labs & imaging results that were available during my care of the patient were reviewed by me and considered in my medical decision making (see chart for details).  Well-appearing, well-nourished and well-developed, 45 year old male in no acute distress. He has not had any chest pain into her 3 days. He has had intermittent and mild  discomfort over the past 2-3 weeks. He was sent to the emergency department due to concerns of the primary physician at [facility] for his EKG. There is minimal ST elevation in lead 2, consistent with early repolarization. Given his well clinical appearance and no discomfort, I have very low suspicion for ischemia.   ----------------------------------------- 1:28 PM on 10/31/2015 -----------------------------------------  Patient's blood test overall normal. His hemoglobin is 16.8. Troponin is negative, chest x-ray is negative. EKG as noted above with early repoll. Patient has not had any symptoms in a few days. My suspicion for ischemic heart disease is extremely low. I discussed this with the patient. I've asked him to follow up with his primary physician again who can arrange for an outpatient stress test if they feel is appropriate. ____________________________________________   FINAL CLINICAL IMPRESSION(S) / ED DIAGNOSES  Final diagnoses:  Discomfort in chest   resolved EKG with early repolarization.   Darien Ramus, MD 10/31/15 [phone]

## 2016-01-10 NOTE — Patient Instructions (Signed)
  Your procedure is scheduled on: 01-11-16 Report to [facility] 2ND FLOOR To find out your arrival time please call [phone] between 1PM - 3PM on 01-10-16  Remember: Instructions that are not followed completely may result in serious medical risk, up to and including death, or upon the discretion of your surgeon and anesthesiologist your surgery may need to be rescheduled.    _X___ 1. Do not eat food or drink liquids after midnight. No gum chewing or hard candies.     _X___ 2. No Alcohol for 24 hours before or after surgery.   ____ 3. Bring all medications with you on the day of surgery if instructed.    ____ 4. Notify your doctor if there is any change in your medical condition     (cold, fever, infections).     Do not wear jewelry, make-up, hairpins, clips or nail polish.  Do not wear lotions, powders, or perfumes. You may wear deodorant.  Do not shave 48 hours prior to surgery. Men may shave face and neck.  Do not bring valuables to the hospital.    [facility] is not responsible for any belongings or valuables.               Contacts, dentures or bridgework may not be worn into surgery.  Leave your suitcase in the car. After surgery it may be brought to your room.  For patients admitted to the hospital, discharge time is determined by your treatment team.   Patients discharged the day of surgery will not be allowed to drive home.   Please read over the following fact sheets that you were given:    ____ Take these medicines the morning of surgery with A SIP OF WATER:    1. NONE  2.   3.   4.  5.  6.  ____ Fleet Enema (as directed)   ____ Use CHG Soap as directed  ____ Use inhalers on the day of surgery  ____ Stop metformin 2 days prior to surgery    ____ Take 1/2 of usual insulin dose the night before surgery and none on the morning of surgery.   ____ Stop Coumadin/Plavix/aspirin-N/A  ____ Stop Anti-inflammatories-NO NSAIDS OR ASA  PRODUCTS-TYLENOL OK TO TAKE   ____ Stop supplements until after surgery.    ____ Bring C-Pap to the hospital.  

## 2016-01-11 ENCOUNTER — Ambulatory Visit
Admission: RE | Admit: 2016-01-11 | Discharge: 2016-01-11 | Disposition: A | Discharge status: Home / Self Care | Payer: PRIVATE HEALTH INSURANCE | Source: Ambulatory Visit | Attending: General Surgery | Admitting: General Surgery

## 2016-01-11 ENCOUNTER — Ambulatory Visit: Payer: PRIVATE HEALTH INSURANCE | Admitting: Certified Registered Nurse Anesthetist

## 2016-01-11 ENCOUNTER — Encounter
Admission: RE | Disposition: A | Discharge status: Home / Self Care | Payer: Self-pay | Source: Ambulatory Visit | Attending: General Surgery

## 2016-01-11 DIAGNOSIS — Z8249 Family history of ischemic heart disease and other diseases of the circulatory system: Secondary | ICD-10-CM | POA: Insufficient documentation

## 2016-01-11 DIAGNOSIS — Z9889 Other specified postprocedural states: Secondary | ICD-10-CM | POA: Diagnosis not present

## 2016-01-11 DIAGNOSIS — Z79899 Other long term (current) drug therapy: Secondary | ICD-10-CM | POA: Insufficient documentation

## 2016-01-11 DIAGNOSIS — F17210 Nicotine dependence, cigarettes, uncomplicated: Secondary | ICD-10-CM | POA: Insufficient documentation

## 2016-01-11 DIAGNOSIS — K4090 Unilateral inguinal hernia, without obstruction or gangrene, not specified as recurrent: Secondary | ICD-10-CM | POA: Diagnosis present

## 2016-01-11 HISTORY — DX: Dysuria: R30.0

## 2016-01-11 HISTORY — DX: Headache, unspecified: R51.9

## 2016-01-11 HISTORY — DX: Constipation, unspecified: K59.00

## 2016-01-11 HISTORY — DX: Cardiac arrhythmia, unspecified: I49.9

## 2016-01-11 HISTORY — PX: INGUINAL HERNIA REPAIR: SHX194

## 2016-01-11 HISTORY — DX: Headache: R51

## 2016-01-11 LAB — URINALYSIS COMPLETE WITH MICROSCOPIC (ARMC ONLY)
Bacteria, UA: NONE SEEN
Bilirubin Urine: NEGATIVE
Glucose, UA: NEGATIVE mg/dL
Hgb urine dipstick: NEGATIVE
Ketones, ur: NEGATIVE mg/dL
Leukocytes, UA: NEGATIVE
Nitrite: NEGATIVE
Protein, ur: NEGATIVE mg/dL
RBC / HPF: NONE SEEN RBC/hpf (ref 0–5)
Specific Gravity, Urine: 1.017 (ref 1.005–1.030)
Squamous Epithelial / LPF: NONE SEEN
pH: 5 (ref 5.0–8.0)

## 2016-01-11 SURGERY — REPAIR, HERNIA, INGUINAL, ADULT
Anesthesia: General | Laterality: Right | Wound class: Clean

## 2016-01-11 MED ORDER — OXYCODONE-ACETAMINOPHEN 5-325 MG PO TABS: 1.0000 | ORAL_TABLET | ORAL | Status: DC | PRN

## 2016-01-11 MED ORDER — LIDOCAINE HCL (PF) 1 % IJ SOLN
INTRAMUSCULAR | Status: AC
  Filled 2016-01-11: qty 30

## 2016-01-11 MED ORDER — CHLORHEXIDINE GLUCONATE 4 % EX LIQD: 1.0000 "application " | Freq: Once | CUTANEOUS | Status: DC

## 2016-01-11 MED ORDER — OXYCODONE-ACETAMINOPHEN 5-325 MG PO TABS
1.0000 | ORAL_TABLET | ORAL | Status: DC | PRN
  Administered 2016-01-11: 1 via ORAL

## 2016-01-11 MED ORDER — FENTANYL CITRATE (PF) 100 MCG/2ML IJ SOLN
INTRAMUSCULAR | Status: AC
  Filled 2016-01-11: qty 2

## 2016-01-11 MED ORDER — LIDOCAINE HCL (CARDIAC) 20 MG/ML IV SOLN
INTRAVENOUS | Status: DC | PRN
Start: 2016-01-11 — End: 2016-01-11
  Administered 2016-01-11: 100 mg via INTRAVENOUS

## 2016-01-11 MED ORDER — MIDAZOLAM HCL 2 MG/2ML IJ SOLN
INTRAMUSCULAR | Status: DC | PRN
  Administered 2016-01-11: 2 mg via INTRAVENOUS

## 2016-01-11 MED ORDER — ONDANSETRON HCL 4 MG/2ML IJ SOLN
INTRAMUSCULAR | Status: DC | PRN
  Administered 2016-01-11: 4 mg via INTRAVENOUS

## 2016-01-11 MED ORDER — BUPIVACAINE HCL (PF) 0.5 % IJ SOLN
INTRAMUSCULAR | Status: DC | PRN
  Administered 2016-01-11: 20 mL

## 2016-01-11 MED ORDER — CEFAZOLIN SODIUM-DEXTROSE 2-3 GM-% IV SOLR
INTRAVENOUS | Status: AC
  Administered 2016-01-11: 2 g via INTRAVENOUS
  Filled 2016-01-11: qty 50

## 2016-01-11 MED ORDER — ACETAMINOPHEN 10 MG/ML IV SOLN
INTRAVENOUS | Status: AC
  Filled 2016-01-11: qty 100

## 2016-01-11 MED ORDER — ONDANSETRON HCL 4 MG/2ML IJ SOLN: 4.0000 mg | Freq: Once | INTRAMUSCULAR | Status: DC | PRN

## 2016-01-11 MED ORDER — LACTATED RINGERS IV SOLN
INTRAVENOUS | Status: DC
  Administered 2016-01-11 (×2): via INTRAVENOUS

## 2016-01-11 MED ORDER — ACETAMINOPHEN 10 MG/ML IV SOLN
INTRAVENOUS | Status: DC | PRN
Start: 2016-01-11 — End: 2016-01-11
  Administered 2016-01-11: 1000 mg via INTRAVENOUS

## 2016-01-11 MED ORDER — BUPIVACAINE HCL (PF) 0.5 % IJ SOLN
INTRAMUSCULAR | Status: AC
  Filled 2016-01-11: qty 30

## 2016-01-11 MED ORDER — CEFAZOLIN SODIUM-DEXTROSE 2-3 GM-% IV SOLR
2.0000 g | INTRAVENOUS | Status: AC
  Administered 2016-01-11: 2 g via INTRAVENOUS

## 2016-01-11 MED ORDER — PROPOFOL 10 MG/ML IV BOLUS
INTRAVENOUS | Status: DC | PRN
  Administered 2016-01-11: 200 mg via INTRAVENOUS

## 2016-01-11 MED ORDER — KETOROLAC TROMETHAMINE 30 MG/ML IJ SOLN
INTRAMUSCULAR | Status: DC | PRN
  Administered 2016-01-11: 30 mg via INTRAVENOUS

## 2016-01-11 MED ORDER — FAMOTIDINE 20 MG PO TABS
ORAL_TABLET | ORAL | Status: AC
  Administered 2016-01-11: 20 mg via ORAL
  Filled 2016-01-11: qty 1

## 2016-01-11 MED ORDER — FENTANYL CITRATE (PF) 100 MCG/2ML IJ SOLN
INTRAMUSCULAR | Status: DC | PRN
  Administered 2016-01-11 (×2): 50 ug via INTRAVENOUS

## 2016-01-11 MED ORDER — FAMOTIDINE 20 MG PO TABS
20.0000 mg | ORAL_TABLET | Freq: Once | ORAL | Status: AC
  Administered 2016-01-11: 20 mg via ORAL

## 2016-01-11 MED ORDER — GLYCOPYRROLATE 0.2 MG/ML IJ SOLN
INTRAMUSCULAR | Status: DC | PRN
  Administered 2016-01-11: 0.2 mg via INTRAVENOUS

## 2016-01-11 MED ORDER — OXYCODONE-ACETAMINOPHEN 5-325 MG PO TABS
ORAL_TABLET | ORAL | Status: AC
  Filled 2016-01-11: qty 1

## 2016-01-11 MED ORDER — FENTANYL CITRATE (PF) 100 MCG/2ML IJ SOLN
25.0000 ug | INTRAMUSCULAR | Status: AC | PRN
  Administered 2016-01-11 (×6): 25 ug via INTRAVENOUS

## 2016-01-11 SURGICAL SUPPLY — 29 items
BLADE SURG 15 STRL SS SAFETY (BLADE) ×2 IMPLANT
CANISTER SUCT 1200ML W/VALVE (MISCELLANEOUS) ×2 IMPLANT
CHLORAPREP W/TINT 26ML (MISCELLANEOUS) ×2 IMPLANT
DECANTER SPIKE VIAL GLASS SM (MISCELLANEOUS) ×4 IMPLANT
DRAIN PENROSE 1/4X12 LTX (DRAIN) ×2 IMPLANT
DRAPE LAPAROTOMY 100X77 ABD (DRAPES) ×2 IMPLANT
ELECT REM PT RETURN 9FT ADLT (ELECTROSURGICAL) ×2
ELECTRODE REM PT RTRN 9FT ADLT (ELECTROSURGICAL) ×1 IMPLANT
GLOVE BIO SURGEON STRL SZ7 (GLOVE) ×10 IMPLANT
GOWN STRL REUS W/ TWL LRG LVL3 (GOWN DISPOSABLE) ×3 IMPLANT
GOWN STRL REUS W/TWL LRG LVL3 (GOWN DISPOSABLE) ×3
KIT RM TURNOVER STRD PROC AR (KITS) ×2 IMPLANT
LABEL OR SOLS (LABEL) ×2 IMPLANT
LIQUID BAND (GAUZE/BANDAGES/DRESSINGS) ×2 IMPLANT
MESH PARIETEX PROGRIP RIGHT (Mesh General) ×2 IMPLANT
NDL HPO THNWL 1X22GA REG BVL (NEEDLE) ×1 IMPLANT
NEEDLE HYPO 25X1 1.5 SAFETY (NEEDLE) ×2 IMPLANT
NEEDLE SAFETY 22GX1 (NEEDLE) ×1
NS IRRIG 500ML POUR BTL (IV SOLUTION) ×2 IMPLANT
PACK BASIN MINOR ARMC (MISCELLANEOUS) ×2 IMPLANT
SUT PDS 2-0 27IN (SUTURE) ×6 IMPLANT
SUT VIC AB 2-0 SH 27 (SUTURE) ×1
SUT VIC AB 2-0 SH 27XBRD (SUTURE) ×1 IMPLANT
SUT VIC AB 3-0 54X BRD REEL (SUTURE) ×1 IMPLANT
SUT VIC AB 3-0 BRD 54 (SUTURE) ×1
SUT VIC AB 3-0 SH 27 (SUTURE) ×1
SUT VIC AB 3-0 SH 27X BRD (SUTURE) ×1 IMPLANT
SUT VIC AB 4-0 FS2 27 (SUTURE) ×2 IMPLANT
SYR CONTROL 10ML (SYRINGE) ×2 IMPLANT

## 2016-01-11 NOTE — Op Note (Signed)
Preop diagnosis: Right inguinal hernia  Post op diagnosis: Same indirect variety  Operation: Repair right inguinal hernia with mesh  Surgeon: S.G.Telia Amundson  Assistant:     Anesthesia: Gen.  Complications: None  EBL: Minimal  Drains: None  Description: Patient was placed supine on the operating table and put to sleep with an LMA. The right groin was prepped and draped sterile field and timeout performed. A standard incision along the medial inguinal inguinal canal was mapped out and the area infiltrated with 0.5% Marcaine for postop analgesia. Skin incision was made and deepened through the layers down the external oblique and bleeding controlled cautery and ligatures of 3-0 Vicryl. The external oblique was opened along along the line of its fibers through the external ring. Further dissection was then completed performed and after the cord was freed from the posterior wall it was opened along the the external fascia. The retrograde patient had a very long indirect sac that extended all the way into the upper scrotum and was somewhat adherent to the cord structures particularly the vas deferens. With careful exposure and using blunt and sharp dissection the sac was completely freed from surrounding cord structures down to the internal ring area. The sac was opened and noted that there were no contents but a loop of small bowel was lying at the neck of the sac. With good exposure the neck of the sac was suture ligated with the is 2-0 Vicryl and amputated. The suture ligature was then used to tack the stump to the undersurface of the internal oblique muscle. The external fascia of the spermatic cord was closed with the 2-0 Vicryl. After the posterior wall was satisfactorily exposed a Progrip mesh was placed around the cord and laid down against the posterior wall. Medial end of this was tacked to the pubic tubercle with 2 stitches of 2-0 PDS. The wound was then irrigated and after ensuring hemostasis the  external oblique was closed with a running 2-0 PDS. Subcutaneous tissue was closed with the running 3-0 Vicryl and the skin closed with subcuticular 4-0 Vicryl. Liquid ban was applied. Patient subsequently was returned recovery room stable condition.

## 2016-01-11 NOTE — H&P (View-Only) (Signed)
Patient ID: Joseph Strong, male   DOB: 1971-09-27, 45 y.o.   MRN: [id number]  Chief Complaint  Patient presents with  . Pre-op Exam    right inguinal hernia    HPI [name] is a 45 y.o. male here today for his pre-op right inguinal hernia scheduled on 01/12/16. No recent changes to his hernia since CT scan. I have reviewed the history of present illness with the patient.  HPI  Past Medical History  Diagnosis Date  . Inguinal hernia     left     Past Surgical History  Procedure Laterality Date  . Tonsillectomy      childhood  . Hernia repair Left 1998    Family History  Problem Relation Age of Onset  . Hypertension Maternal Grandmother     Social History Social History  Substance Use Topics  . Smoking status: Current Some Day Smoker    Types: Cigarettes  . Smokeless tobacco: Never Used  . Alcohol Use: 0.0 oz/week    0 Glasses of wine, 0 Standard drinks or equivalent per week     Comment: occasional    No Known Allergies  Current Outpatient Prescriptions  Medication Sig Dispense Refill  . celecoxib (CELEBREX) 100 MG capsule Take 1 capsule (100 mg total) by mouth 2 (two) times daily. (Patient not taking: Reported on 01/04/2016) 30 capsule 0  . tizanidine (ZANAFLEX) 2 MG capsule Take 1 capsule (2 mg total) by mouth 3 (three) times daily. (Patient not taking: Reported on 01/04/2016) 21 capsule 0   No current facility-administered medications for this visit.   Facility-Administered Medications Ordered in Other Visits  Medication Dose Route Frequency Provider Last Rate Last Dose  . sodium chloride flush 0.9 % injection             Review of Systems Review of Systems  Constitutional: Negative.   Respiratory: Negative.   Cardiovascular: Negative.     Blood pressure 140/82, pulse 74, resp. rate 14, height 6' (1.829 m), weight 189 lb (85.73 kg).  Physical Exam Physical Exam  Constitutional: He is oriented to person, place, and time. He appears well-developed and  well-nourished.  Eyes: Conjunctivae are normal. No scleral icterus.  Neck: Neck supple.  Cardiovascular: Normal rate, regular rhythm and normal heart sounds.   Pulmonary/Chest: Effort normal and breath sounds normal.  Abdominal: Soft. Normal appearance and bowel sounds are normal. There is no hepatomegaly. There is no tenderness. A hernia is present. Hernia confirmed positive in the right inguinal area (Reducible, traveling into scrotum). Hernia confirmed negative in the ventral area and confirmed negative in the left inguinal area.    Lymphadenopathy:    He has no cervical adenopathy.  Neurological: He is alert and oriented to person, place, and time.  Skin: Skin is warm and dry.    Data Reviewed Notes reviewed.  Assessment    Right inguinal hernia involving bowel loops seen on CT. No changes.    Plan    Proceed with repair of right inguinal hernia.     Hernia precautions and incarceration were discussed with the patient. If they develop symptoms of an incarcerated hernia, they were encouraged to seek prompt medical attention.  I have recommended repair of the hernia using mesh on an outpatient basis in the near future. The risk of infection was reviewed. The role of prosthetic mesh to minimize the risk of recurrence was reviewed.  Patient's surgery has been moved up from 01-12-16 to 01-11-16 at [facility].  PCP:  Brayton El  This information has been scribed by Ples Specter CMA.   Trica Usery G 01/04/2016, 3:01 PM

## 2016-01-11 NOTE — Anesthesia Procedure Notes (Signed)
Procedure Name: LMA Insertion Date/Time: 01/11/2016 12:04 PM Performed by: Stormy Fabian Pre-anesthesia Checklist: Patient identified, Patient being monitored, Timeout performed, Emergency Drugs available and Suction available Patient Re-evaluated:Patient Re-evaluated prior to inductionOxygen Delivery Method: Circle system utilized Preoxygenation: Pre-oxygenation with 100% oxygen Intubation Type: IV induction Ventilation: Mask ventilation without difficulty LMA: LMA inserted LMA Size: 4.5 Tube type: Oral Number of attempts: 1 Placement Confirmation: positive ETCO2 and breath sounds checked- equal and bilateral Tube secured with: Tape Dental Injury: Teeth and Oropharynx as per pre-operative assessment

## 2016-01-11 NOTE — Transfer of Care (Signed)
Immediate Anesthesia Transfer of Care Note  Patient: Joseph Strong  Procedure(s) Performed: Procedure(s): HERNIA REPAIR INGUINAL ADULT (Right)  Patient Location: PACU  Anesthesia Type:General  Level of Consciousness: sedated  Airway & Oxygen Therapy: Patient Spontanous Breathing and Patient connected to face mask oxygen  Post-op Assessment: Report given to RN and Post -op Vital signs reviewed and stable  Post vital signs: Reviewed and stable  Last Vitals:  Filed Vitals:   01/11/16 1127 01/11/16 1331  BP: 137/77 106/60  Pulse: 86   Temp: 36.2 C 37.1 C  Resp: 18 15    Complications: No apparent anesthesia complications

## 2016-01-11 NOTE — OR Nursing (Signed)
  Dr Pernell Dupre reviewed previous EKG and discontinued EKG for today.

## 2016-01-11 NOTE — Anesthesia Preprocedure Evaluation (Addendum)
Anesthesia Evaluation  Patient identified by MRN, date of birth, ID band Patient awake    Reviewed: Allergy & Precautions, H&P , NPO status , Patient's Chart, lab work & pertinent test results, reviewed documented beta blocker date and time   Airway Mallampati: II  TM Distance: >3 FB Neck ROM: full    Dental  (+) Teeth Intact   Pulmonary neg pulmonary ROS, Current Smoker,    Pulmonary exam normal        Cardiovascular negative cardio ROS Normal cardiovascular exam(-) dysrhythmias  Rhythm:regular Rate:Normal  Multiple EKGs reviewed and show a consistent pattern of mild ST elevation consistent with LVH.  No anginal equivalents and the patient is active with a good exercise tolerance and can run one mile without difficulty.  JA   Neuro/Psych  Headaches, negative neurological ROS  negative psych ROS   GI/Hepatic negative GI ROS, Neg liver ROS,   Endo/Other  negative endocrine ROS  Renal/GU negative Renal ROS  negative genitourinary   Musculoskeletal   Abdominal   Peds  Hematology negative hematology ROS (+)   Anesthesia Other Findings Past Medical History:   Inguinal hernia                                                Comment:left    Headache                                                       Comment:MIGRAINES   Dysrhythmia                                                    Comment:"OCCASSIONAL SPEEDING UP OF HEART RATE" PT               THINKING IT MAY BE DUE TO HERNIA   Burning with urination                          01-10-16      Constipation                                               Past Surgical History:   TONSILLECTOMY                                                   Comment:childhood   HERNIA REPAIR                                   Left 1998           Comment:AND UMBILICAL   Reproductive/Obstetrics negative OB ROS  Anesthesia Physical Anesthesia  Plan  ASA: II  Anesthesia Plan: General ETT   Post-op Pain Management:    Induction:   Airway Management Planned:   Additional Equipment:   Intra-op Plan:   Post-operative Plan:   Informed Consent: I have reviewed the patients History and Physical, chart, labs and discussed the procedure including the risks, benefits and alternatives for the proposed anesthesia with the patient or authorized representative who has indicated his/her understanding and acceptance.   Dental Advisory Given  Plan Discussed with: CRNA  Anesthesia Plan Comments:         Anesthesia Quick Evaluation

## 2016-01-11 NOTE — Interval H&P Note (Signed)
History and Physical Interval Note:  01/11/2016 11:23 AM  Joseph Strong  has presented today for surgery, with the diagnosis of right inguinal hernia  The various methods of treatment have been discussed with the patient and family. After consideration of risks, benefits and other options for treatment, the patient has consented to  Procedure(s): HERNIA REPAIR INGUINAL ADULT (Right) as a surgical intervention .  The patient's history has been reviewed, patient examined, no change in status, stable for surgery.  I have reviewed the patient's chart and labs.  Questions were answered to the patient's satisfaction.     [name]

## 2016-01-11 NOTE — Discharge Instructions (Signed)
General Anesthesia, Adult °General anesthesia is a sleep-like state of non-feeling produced by medicines (anesthetics). General anesthesia prevents you from being alert and feeling pain during a medical procedure. Your caregiver may recommend general anesthesia if your procedure: °· Is long. °· Is painful or uncomfortable. °· Would be frightening to see or hear. °· Requires you to be still. °· Affects your breathing. °· Causes significant blood loss. °LET YOUR CAREGIVER KNOW ABOUT: °· Allergies to food or medicine. °· Medicines taken, including vitamins, herbs, eyedrops, over-the-counter medicines, and creams. °· Use of steroids (by mouth or creams). °· Previous problems with anesthetics or numbing medicines, including problems experienced by relatives. °· History of bleeding problems or blood clots. °· Previous surgeries and types of anesthetics received. °· Possibility of pregnancy, if this applies. °· Use of cigarettes, alcohol, or illegal drugs. °· Any health condition(s), especially diabetes, sleep apnea, and high blood pressure. °RISKS AND COMPLICATIONS °General anesthesia rarely causes complications. However, if complications do occur, they can be life threatening. Complications include: °· A lung infection. °· A stroke. °· A heart attack. °· Waking up during the procedure. When this occurs, the patient may be unable to move and communicate that he or she is awake. The patient may feel severe pain. °Older adults and adults with serious medical problems are more likely to have complications than adults who are young and healthy. Some complications can be prevented by answering all of your caregiver's questions thoroughly and by following all pre-procedure instructions. It is important to tell your caregiver if any of the pre-procedure instructions, especially those related to diet, were not followed. Any food or liquid in the stomach can cause problems when you are under general anesthesia. °BEFORE THE  PROCEDURE °· Ask your caregiver if you will have to spend the night at the hospital. If you will not have to spend the night, arrange to have an adult drive you and stay with you for 24 hours. °· Follow your caregiver's instructions if you are taking dietary supplements or medicines. Your caregiver may tell you to stop taking them or to reduce your dosage. °· Do not smoke for as long as possible before your procedure. If possible, stop smoking 3-6 weeks before the procedure. °· Do not take new dietary supplements or medicines within 1 week of your procedure unless your caregiver approves them. °· Do not eat within 8 hours of your procedure or as directed by your caregiver. Drink only clear liquids, such as water, black coffee (without milk or cream), and fruit juices (without pulp). °· Do not drink within 3 hours of your procedure or as directed by your caregiver. °· You may brush your teeth on the morning of the procedure, but make sure to spit out the toothpaste and water when finished. °PROCEDURE  °You will receive anesthetics through a mask, through an intravenous (IV) access tube, or through both. A doctor who specializes in anesthesia (anesthesiologist) or a nurse who specializes in anesthesia (nurse anesthetist) or both will stay with you throughout the procedure to make sure you remain unconscious. He or she will also watch your blood pressure, pulse, and oxygen levels to make sure that the anesthetics do not cause any problems. Once you are asleep, a breathing tube or mask may be used to help you breathe. °AFTER THE PROCEDURE °You will wake up after the procedure is complete. You may be in the room where the procedure was performed or in a recovery area. You may have a sore throat   if a breathing tube was used. You may also feel: °· Dizzy. °· Weak. °· Drowsy. °· Confused. °· Nauseous. °· Cold. °These are all normal responses and can be expected to last for up to 24 hours after the procedure is complete. A  caregiver will tell you when you are ready to go home. This will usually be when you are fully awake and in stable condition. °  °This information is not intended to replace advice given to you by your health care provider. Make sure you discuss any questions you have with your health care provider. °  °Document Released: 02/18/2008 Document Revised: 12/02/2014 Document Reviewed: 03/11/2012 °Elsevier Interactive Patient Education ©2016 [employer] ° °

## 2016-01-12 ENCOUNTER — Encounter: Payer: Self-pay | Admitting: General Surgery

## 2016-01-12 NOTE — Anesthesia Postprocedure Evaluation (Signed)
Anesthesia Post Note  Patient: Mattie Novosel  Procedure(s) Performed: Procedure(s) (LRB): HERNIA REPAIR INGUINAL ADULT (Right)  Patient location during evaluation: PACU Anesthesia Type: General Level of consciousness: awake and alert Pain management: pain level controlled Vital Signs Assessment: post-procedure vital signs reviewed and stable Respiratory status: spontaneous breathing, nonlabored ventilation, respiratory function stable and patient connected to nasal cannula oxygen Cardiovascular status: blood pressure returned to baseline and stable Postop Assessment: no signs of nausea or vomiting Anesthetic complications: no    Last Vitals:  Filed Vitals:   01/11/16 1505 01/11/16 1520  BP: 113/67 114/73  Pulse: 63 55  Temp: 36.6 C   Resp: 16 16    Last Pain:  Filed Vitals:   01/12/16 0846  PainSc: 2                  Yevette Edwards

## 2016-01-16 ENCOUNTER — Ambulatory Visit: Payer: PRIVATE HEALTH INSURANCE | Admitting: Family Medicine

## 2016-01-18 ENCOUNTER — Ambulatory Visit (INDEPENDENT_AMBULATORY_CARE_PROVIDER_SITE_OTHER): Payer: PRIVATE HEALTH INSURANCE | Admitting: General Surgery

## 2016-01-18 ENCOUNTER — Encounter: Payer: Self-pay | Admitting: General Surgery

## 2016-01-18 VITALS — BP 142/68 | HR 82 | Resp 12 | Ht 72.0 in | Wt 186.0 lb

## 2016-01-18 DIAGNOSIS — K4090 Unilateral inguinal hernia, without obstruction or gangrene, not specified as recurrent: Secondary | ICD-10-CM

## 2016-01-18 NOTE — Progress Notes (Signed)
Here today for postop visit,right inguinal hernia repair on 01-11-16. He is still having some pain and soreness. I have reviewed the history of present illness with the patient. Rash present extending laterally towards flank. Incision clean, repair intact. Hydrocortisone cream as needed to rash. Follow up in 10 days. Return to work 02-12-16   PCP:  Brayton El  This information has been scribed by Dorathy Daft RNBC.

## 2016-01-18 NOTE — Patient Instructions (Addendum)
The patient is aware to call back for any questions or concerns. Hydrocortisone cream as needed to rash.

## 2016-01-22 ENCOUNTER — Ambulatory Visit: Payer: PRIVATE HEALTH INSURANCE | Admitting: General Surgery

## 2016-01-30 ENCOUNTER — Ambulatory Visit (INDEPENDENT_AMBULATORY_CARE_PROVIDER_SITE_OTHER): Payer: PRIVATE HEALTH INSURANCE | Admitting: General Surgery

## 2016-01-30 ENCOUNTER — Encounter: Payer: Self-pay | Admitting: General Surgery

## 2016-01-30 VITALS — BP 128/74 | HR 76 | Resp 12 | Ht 72.0 in | Wt 191.0 lb

## 2016-01-30 DIAGNOSIS — K4090 Unilateral inguinal hernia, without obstruction or gangrene, not specified as recurrent: Secondary | ICD-10-CM

## 2016-01-30 NOTE — Patient Instructions (Signed)
Return in three weeks.. 

## 2016-01-30 NOTE — Progress Notes (Signed)
This is a 45 year old male here today for postop visit,right inguinal hernia repair on 01-11-16. He states that the area is still a bit sore otherwise, he is doing well.  I have reviewed the history of present illness with the patient.  Hernia repair is intact and healing well.  Patient to return to work on 02/12/16. Return in three weeks.  PCP:  Brayton ElShah, Syed  This information has been scribed by Ples SpecterJessica Qualls CMA.

## 2016-02-07 ENCOUNTER — Ambulatory Visit (INDEPENDENT_AMBULATORY_CARE_PROVIDER_SITE_OTHER): Payer: PRIVATE HEALTH INSURANCE | Admitting: General Surgery

## 2016-02-07 ENCOUNTER — Encounter: Payer: Self-pay | Admitting: General Surgery

## 2016-02-07 VITALS — BP 120/84 | HR 74 | Resp 12 | Ht 72.0 in | Wt 190.0 lb

## 2016-02-07 DIAGNOSIS — K4090 Unilateral inguinal hernia, without obstruction or gangrene, not specified as recurrent: Secondary | ICD-10-CM

## 2016-02-07 NOTE — Patient Instructions (Signed)
Patient to return as needed. 

## 2016-02-07 NOTE — Progress Notes (Signed)
This is a 45 year old male here today for his post op right inguinal hernia repair on 01-11-16. Patient is doing well. He feels like he is about 85% comfortable. Does not feel he is quite ready to go back to his regular work. I have reviewed the history of present illness with the patient.  Incision is clean and well healed . Hernia repair is intact. The seroma like swelling in inguinoscrotal area is almost fully resolved. He is advised the repair is intact.  Patient to return as needed. May return on work on 02/21/16.    PCP:  Brayton ElShah, Syed  This information has been scribed by Ples SpecterJessica Qualls CMA.

## 2016-02-08 ENCOUNTER — Emergency Department: Payer: PRIVATE HEALTH INSURANCE

## 2016-02-08 ENCOUNTER — Emergency Department
Admission: EM | Admit: 2016-02-08 | Discharge: 2016-02-08 | Disposition: A | Discharge status: Home / Self Care | Payer: PRIVATE HEALTH INSURANCE | Attending: Emergency Medicine | Admitting: Emergency Medicine

## 2016-02-08 ENCOUNTER — Encounter: Payer: Self-pay | Admitting: Emergency Medicine

## 2016-02-08 DIAGNOSIS — R55 Syncope and collapse: Secondary | ICD-10-CM

## 2016-02-08 DIAGNOSIS — R1031 Right lower quadrant pain: Secondary | ICD-10-CM | POA: Insufficient documentation

## 2016-02-08 DIAGNOSIS — R42 Dizziness and giddiness: Secondary | ICD-10-CM | POA: Diagnosis present

## 2016-02-08 DIAGNOSIS — F17210 Nicotine dependence, cigarettes, uncomplicated: Secondary | ICD-10-CM | POA: Insufficient documentation

## 2016-02-08 LAB — BASIC METABOLIC PANEL
Anion gap: 6 (ref 5–15)
BUN: 16 mg/dL (ref 6–20)
CO2: 24 mmol/L (ref 22–32)
Calcium: 8.8 mg/dL — ABNORMAL LOW (ref 8.9–10.3)
Chloride: 108 mmol/L (ref 101–111)
Creatinine, Ser: 1.15 mg/dL (ref 0.61–1.24)
GFR calc Af Amer: >60 mL/min (ref >60)
GFR calc non Af Amer: >60 mL/min (ref >60)
Glucose, Bld: 85 mg/dL (ref 65–99)
Potassium: 3.8 mmol/L (ref 3.5–5.1)
Sodium: 138 mmol/L (ref 135–145)

## 2016-02-08 LAB — URINALYSIS COMPLETE WITH MICROSCOPIC (ARMC ONLY)
Bacteria, UA: NONE SEEN
Bilirubin Urine: NEGATIVE
Glucose, UA: NEGATIVE mg/dL
Hgb urine dipstick: NEGATIVE
Ketones, ur: NEGATIVE mg/dL
Leukocytes, UA: NEGATIVE
Nitrite: NEGATIVE
Protein, ur: NEGATIVE mg/dL
Specific Gravity, Urine: 1.009 (ref 1.005–1.030)
Squamous Epithelial / LPF: NONE SEEN
WBC, UA: NONE SEEN WBC/hpf (ref 0–5)
pH: 6 (ref 5.0–8.0)

## 2016-02-08 LAB — CBC
HCT: 45.7 % (ref 40.0–52.0)
Hemoglobin: 15.1 g/dL (ref 13.0–18.0)
MCH: 27.8 pg (ref 26.0–34.0)
MCHC: 33.1 g/dL (ref 32.0–36.0)
MCV: 83.9 fL (ref 80.0–100.0)
Platelets: 274 10*3/uL (ref 150–440)
RBC: 5.44 MIL/uL (ref 4.40–5.90)
RDW: 13.6 % (ref 11.5–14.5)
WBC: 4.3 10*3/uL (ref 3.8–10.6)

## 2016-02-08 LAB — TROPONIN I: Troponin I: <0.03 ng/mL (ref <0.031)

## 2016-02-08 LAB — LIPASE, BLOOD: Lipase: 22 U/L (ref 11–51)

## 2016-02-08 NOTE — ED Notes (Signed)
He is here with multiple medical complaints - pt reports that he had inguinal hernia repai approx 1 month ago and he just wants to make sure that everything looks okay - he had hernia repair 19 years ago and he reports pain to that old site - pt also stating pain between his shoulder blades  - EDP to assess

## 2016-02-08 NOTE — ED Notes (Signed)
Pt reports pain with urination, also reports groin pain and dizziness. Pt reports hx of hernia repair 1 month ago, reports he was taking percocet until a week ago and is now taking tylenol and aleve. Pt also reports pressure inbetween shoulder blades.

## 2016-02-08 NOTE — ED Provider Notes (Signed)
Time Seen: Approximately 1250  I have reviewed the triage notes  Chief Complaint: Dizziness and Groin Pain   History of Present Illness: Joseph Strong is a 45 y.o. male who states multiple medical concerns today, all seem to be over a significant period of time of the least 2 months. Patient states he has seen a surgeon for some chronic right groin pain had previous hernia surgery. He states he's developed some occasional left-sided groin pain and difficulty with urination. The patient has had a CAT scan, etc. with no obvious abnormalities noted and was told that it may be some scar tissue. The patient's second complaint is some feelings of dizziness which is described as a mix of feeling lightheaded versus vertigo. He has seen his primary doctor and had a outpatient evaluation or was some mention of a possible MRI if his symptoms persisted. Any syncopal episodes or persistent chest pain. He does have pain in the middle of the thoracic area which he's been told is due to his scoliosis. He denies any true shortness of breath, nausea, vomiting, fever etc.   Past Medical History  Diagnosis Date  . Inguinal hernia     left   . Headache     MIGRAINES  . Dysrhythmia     "OCCASSIONAL SPEEDING UP OF HEART RATE" PT THINKING IT MAY BE DUE TO HERNIA  . Burning with urination 01-10-16  . Constipation     Patient Active Problem List   Diagnosis Date Noted  . Decreased frequency of bowel movements 01/10/2016  . Left paraspinal back pain 12/08/2015  . Dizziness, nonspecific 10/31/2015  . Abnormal TSH 10/31/2015  . Left inguinal pain 10/31/2015  . Abnormal EKG 10/31/2015  . Annual physical exam 09/20/2015  . Testicular pain, left 09/20/2015  . Unilateral inguinal hernia without obstruction or gangrene 09/20/2015  . S/P unilateral inguinal hernia repair 09/20/2015  . Bacteria in urine 09/06/2015    Past Surgical History  Procedure Laterality Date  . Tonsillectomy      childhood  . Hernia  repair Left 1998    AND UMBILICAL  . Inguinal hernia repair Right 01/11/2016    Procedure: HERNIA REPAIR INGUINAL ADULT;  Surgeon: Kieth Brightly, MD;  Location: [facility] ORS;  Service: General;  Laterality: Right;    Past Surgical History  Procedure Laterality Date  . Tonsillectomy      childhood  . Hernia repair Left 1998    AND UMBILICAL  . Inguinal hernia repair Right 01/11/2016    Procedure: HERNIA REPAIR INGUINAL ADULT;  Surgeon: Kieth Brightly, MD;  Location: [facility] ORS;  Service: General;  Laterality: Right;    Current Outpatient Rx  Name  Route  Sig  Dispense  Refill  . acetaminophen (TYLENOL) 500 MG tablet   Oral   Take 500 mg by mouth every 6 (six) hours as needed for mild pain.         . celecoxib (CELEBREX) 100 MG capsule   Oral   Take 1 capsule (100 mg total) by mouth 2 (two) times daily.   30 capsule   0   . Multiple Vitamin (MULTIVITAMIN) tablet   Oral   Take 1 tablet by mouth daily.         . polyethylene glycol (MIRALAX / GLYCOLAX) packet   Oral   Take 17 g by mouth daily. 1 capful/day, dissolve in 8 oz of liquid.   14 each   0   . tizanidine (ZANAFLEX) 2 MG capsule  Oral   Take 1 capsule (2 mg total) by mouth 3 (three) times daily.   21 capsule   0     Allergies:  Review of patient's allergies indicates no known allergies.  Family History: Family History  Problem Relation Age of Onset  . Hypertension Maternal Grandmother     Social History: Social History  Substance Use Topics  . Smoking status: Current Some Day Smoker    Types: Cigarettes  . Smokeless tobacco: Never Used  . Alcohol Use: 0.0 oz/week    0 Glasses of wine, 0 Standard drinks or equivalent per week     Comment: occasional     Review of Systems:   10 point review of systems was performed and was otherwise negative:  Constitutional: No fever Eyes: No visual disturbances ENT: No sore throat, ear pain Cardiac: No chest pain. Pain in the middle of the back  that seems to be worse with movement Respiratory: No shortness of breath, wheezing, or stridor Abdomen: Bilateral groin pain Endocrine: No weight loss, No night sweats Extremities: No peripheral edema, cyanosis Skin: No rashes, easy bruising Neurologic: No focal weakness, trouble with speech or swollowing Urologic: No dysuria, Hematuria, or urinary frequency No penile discharge or drainage  Physical Exam:  ED Triage Vitals  Enc Vitals Group     BP 02/08/16 1028 152/95 mmHg     Pulse Rate 02/08/16 1028 96     Resp 02/08/16 1028 16     Temp 02/08/16 1028 98 F (36.7 C)     Temp Source 02/08/16 1028 Oral     SpO2 02/08/16 1028 100 %     Weight 02/08/16 1030 190 lb (86.183 kg)     Height 02/08/16 1030 6' (1.829 m)     Head Cir --      Peak Flow --      Pain Score 02/08/16 1030 7     Pain Loc --      Pain Edu? --      Excl. in GC? --     General: Awake , Alert , and Oriented times 3; GCS 15 Head: Normal cephalic , atraumatic Eyes: Pupils equal , round, reactive to light Nose/Throat: No nasal drainage, patent upper airway without erythema or exudate.  Neck: Supple, Full range of motion, No anterior adenopathy or palpable thyroid masses Lungs: Clear to ascultation without wheezes , rhonchi, or rales Heart: Regular rate, regular rhythm without murmurs , gallops , or rubs Abdomen: Soft, non tender without rebound, guarding , or rigidity; bowel sounds positive and symmetric in all 4 quadrants. No organomegaly .        Extremities: 2 plus symmetric pulses. No edema, clubbing or cyanosis Neurologic: normal ambulation, Motor symmetric without deficits, sensory intact Skin: warm, dry, no rashes Palpable scoliosis with some mid upper thoracic discomfort.  Labs:   All laboratory work was reviewed including any pertinent negatives or positives listed below:  Labs Reviewed  BASIC METABOLIC PANEL - Abnormal; Notable for the following:    Calcium 8.8 (*)    All other components within  normal limits  URINALYSIS COMPLETEWITH MICROSCOPIC ([facility] ONLY) - Abnormal; Notable for the following:    Color, Urine YELLOW (*)    APPearance CLEAR (*)    All other components within normal limits  CBC  LIPASE, BLOOD  TROPONIN I  Laboratory work was reviewed and showed no clinically significant abnormalities.   EKG:  ED ECG REPORT I, Jennye Moccasin, the attending physician, personally viewed and  interpreted this ECG.  Date: 02/08/2016 EKG Time: 1036 Rate: 73 Rhythm: normal sinus rhythm QRS Axis: normal Intervals: normal ST/T Wave abnormalities: normal Conduction Disturbances: none Narrative Interpretation: unremarkable Mild LVH no acute ischemic changes   Radiology: *  Procedure changed from [facility] Chest 1 View      Final result by Rad Results In Interface (02/08/16 12:53:27)   Narrative:   CLINICAL DATA: Upper chest pain with dizziness  EXAM: CHEST 2 VIEW  COMPARISON: October 31, 2015  FINDINGS: There is no demonstrable edema or consolidation. Heart size and pulmonary vascularity are normal. No adenopathy. Marked mid thoracic dextroscoliosis is again noted without appreciable change. No pneumothorax.  IMPRESSION: Scoliosis. No edema or consolidation. No appreciable change from prior study.   Electronically Signed By: Bretta BangWilliam Woodruff III M.D. On: 02/08/2016 12:53         I personally reviewed the radiologic studies    ED Course:  Patient's stay here was uneventful and he is otherwise hemodynamically stable. I felt it was unlikely to be an emergent cause for his multiple symptoms that seem to be going on for an extended period of time and have been followed on an outpatient basis. I felt immediate MRI, etc. was not necessary to patient's dizziness seemed to be more so near syncopal. He's are unlikely to be cardiogenic episodes as he has no other symptoms with him. Does have mid thoracic pain which seems to be fairly reproducible and with the  patient's severe scoliosis I don't have any reason to disagree with any of his previous evaluations.    Assessment:  Scoliosis Anxiety Near syncope  Final Clinical Impression:  Final diagnoses:  Dizziness     Plan: * Outpatient management Patient was advised to return immediately if condition worsens. Patient was advised to follow up with their primary care physician or other specialized physicians involved in their outpatient care. The patient and/or family member/power of attorney had laboratory results reviewed at the bedside. All questions and concerns were addressed and appropriate discharge instructions were distributed by the nursing staff.             Jennye MoccasinBrian S Mads Borgmeyer, MD 02/08/16 1321

## 2016-02-08 NOTE — ED Notes (Signed)
[facility] and other facilities that will assist with care when he has no insurance provided to him upon discharge

## 2016-02-08 NOTE — Discharge Instructions (Signed)

## 2016-02-19 ENCOUNTER — Telehealth: Payer: Self-pay | Admitting: *Deleted

## 2016-02-19 NOTE — Telephone Encounter (Signed)
Patient states he is not having any pain but still sore.

## 2016-02-19 NOTE — Telephone Encounter (Signed)
Patient want to come back in for a recheck on Thursday 02/22/16. Irving Burtonmily to make appt.

## 2016-02-19 NOTE — Telephone Encounter (Signed)
Patient wanted to speak with a nurse about still being sore on the right groin area after sx on 01/11/16

## 2016-02-20 ENCOUNTER — Ambulatory Visit: Payer: PRIVATE HEALTH INSURANCE | Admitting: General Surgery

## 2016-02-22 ENCOUNTER — Encounter: Payer: Self-pay | Admitting: General Surgery

## 2016-02-22 ENCOUNTER — Ambulatory Visit (INDEPENDENT_AMBULATORY_CARE_PROVIDER_SITE_OTHER): Payer: PRIVATE HEALTH INSURANCE | Admitting: General Surgery

## 2016-02-22 VITALS — BP 124/76 | HR 78 | Resp 12 | Ht 72.0 in | Wt 194.0 lb

## 2016-02-22 DIAGNOSIS — K4090 Unilateral inguinal hernia, without obstruction or gangrene, not specified as recurrent: Secondary | ICD-10-CM

## 2016-02-22 NOTE — Patient Instructions (Signed)
Patient to return as needed. 

## 2016-02-22 NOTE — Progress Notes (Signed)
This is a 45 year old male here today for his post op right inguinal hernia repair on 01-11-16. Patient is doing well.He states he just wants to get the area check one more time to be sure everything is ok.  I have reviewed the history of present illness with the patient. Exam showed no hernia. Incision remins well healed. Mild thickening in the spermatic cord upper scrotum appears to be resolving. Pt reassured.  Patient to return as needed.   PCP:  Brayton ElShah, Syed   This information has been scribed by Ples SpecterJessica Qualls CMA.

## 2016-02-28 ENCOUNTER — Encounter: Payer: Self-pay | Admitting: General Surgery

## 2016-04-08 ENCOUNTER — Encounter: Payer: Self-pay | Admitting: *Deleted

## 2016-04-08 ENCOUNTER — Emergency Department
Admission: EM | Admit: 2016-04-08 | Discharge: 2016-04-08 | Disposition: A | Discharge status: Home / Self Care | Payer: PRIVATE HEALTH INSURANCE | Attending: Emergency Medicine | Admitting: Emergency Medicine

## 2016-04-08 DIAGNOSIS — R1032 Left lower quadrant pain: Secondary | ICD-10-CM

## 2016-04-08 DIAGNOSIS — F17210 Nicotine dependence, cigarettes, uncomplicated: Secondary | ICD-10-CM | POA: Insufficient documentation

## 2016-04-08 DIAGNOSIS — Z79899 Other long term (current) drug therapy: Secondary | ICD-10-CM | POA: Insufficient documentation

## 2016-04-08 LAB — URINALYSIS COMPLETE WITH MICROSCOPIC (ARMC ONLY)
Bacteria, UA: NONE SEEN
Bilirubin Urine: NEGATIVE
Glucose, UA: NEGATIVE mg/dL
Hgb urine dipstick: NEGATIVE
Ketones, ur: NEGATIVE mg/dL
Leukocytes, UA: NEGATIVE
Nitrite: NEGATIVE
Protein, ur: NEGATIVE mg/dL
RBC / HPF: NONE SEEN RBC/hpf (ref 0–5)
Specific Gravity, Urine: 1.016 (ref 1.005–1.030)
Squamous Epithelial / LPF: NONE SEEN
pH: 6 (ref 5.0–8.0)

## 2016-04-08 NOTE — ED Provider Notes (Signed)
[facility] Emergency Department Provider Note  ____________________________________________   I have reviewed the triage vital signs and the nursing notes.   HISTORY  Chief Complaint Flank Pain    HPI Joseph Strong is a 45 y.o. male with a history of bilateral inguinal hernia repair is 120 years ago the other few months ago presents today complaining of a discomfort in the pubic region of the left side in the inguinal region where he had his initial surgery. He comes and goes last several months. He's worried that maybe a hernia is coming back. He denies any flank pain nausea vomiting or diarrhea. Only pain he identifies as that in the inguinal region. He also states sometimes when he lifts weights he gets muscle spasms in his back. Has been going on for "years". He is not having anything like that today. He has no chest pain shortness of breath nausea or vomiting. The pain is in the mid back does not radiate. He has no numbness or weakness. His been several weeks he believes since he last had the pain but he thought he would mention it while he was here. He denies any true flank pain, at this time his only concern is that sometimes he has discomfort and the area of his prior inguinal hernia repair from years ago. Patient also adds that he has no ability to stay here for a workup because he needs to leave right away.   Past Medical History  Diagnosis Date  . Inguinal hernia     left   . Headache     MIGRAINES  . Dysrhythmia     "OCCASSIONAL SPEEDING UP OF HEART RATE" PT THINKING IT MAY BE DUE TO HERNIA  . Burning with urination 01-10-16  . Constipation     Patient Active Problem List   Diagnosis Date Noted  . Decreased frequency of bowel movements 01/10/2016  . Left paraspinal back pain 12/08/2015  . Dizziness, nonspecific 10/31/2015  . Abnormal TSH 10/31/2015  . Left inguinal pain 10/31/2015  . Abnormal EKG 10/31/2015  . Annual physical exam 09/20/2015   . Testicular pain, left 09/20/2015  . Unilateral inguinal hernia without obstruction or gangrene 09/20/2015  . S/P unilateral inguinal hernia repair 09/20/2015  . Bacteria in urine 09/06/2015    Past Surgical History  Procedure Laterality Date  . Tonsillectomy      childhood  . Hernia repair Left 1998    AND UMBILICAL  . Inguinal hernia repair Right 01/11/2016    Procedure: HERNIA REPAIR INGUINAL ADULT;  Surgeon: Kieth Brightly, MD;  Location: [facility] ORS;  Service: General;  Laterality: Right;    Current Outpatient Rx  Name  Route  Sig  Dispense  Refill  . acetaminophen (TYLENOL) 500 MG tablet   Oral   Take 500 mg by mouth every 6 (six) hours as needed for mild pain.         . celecoxib (CELEBREX) 100 MG capsule   Oral   Take 1 capsule (100 mg total) by mouth 2 (two) times daily.   30 capsule   0   . Multiple Vitamin (MULTIVITAMIN) tablet   Oral   Take 1 tablet by mouth daily.         . polyethylene glycol (MIRALAX / GLYCOLAX) packet   Oral   Take 17 g by mouth daily. 1 capful/day, dissolve in 8 oz of liquid.   14 each   0   . tizanidine (ZANAFLEX) 2 MG capsule  Oral   Take 1 capsule (2 mg total) by mouth 3 (three) times daily.   21 capsule   0     Allergies Review of patient's allergies indicates no known allergies.  Family History  Problem Relation Age of Onset  . Hypertension Maternal Grandmother     Social History Social History  Substance Use Topics  . Smoking status: Current Some Day Smoker    Types: Cigarettes  . Smokeless tobacco: Never Used  . Alcohol Use: 0.0 oz/week    0 Glasses of wine, 0 Standard drinks or equivalent per week     Comment: occasional    Review of Systems Constitutional: No fever/chills Eyes: No visual changes. ENT: No sore throat. No stiff neck no neck pain Cardiovascular: Denies chest pain. Respiratory: Denies shortness of breath. Gastrointestinal:   no vomiting.  No diarrhea.  No  constipation. Genitourinary: Negative for dysuria. Musculoskeletal: Negative lower extremity swelling Skin: Negative for rash. Neurological: Negative for headaches, focal weakness or numbness. 10-point ROS otherwise negative.  ____________________________________________   PHYSICAL EXAM:  VITAL SIGNS: ED Triage Vitals  Enc Vitals Group     BP 04/08/16 1140 145/87 mmHg     Pulse Rate 04/08/16 1140 71     Resp 04/08/16 1140 20     Temp 04/08/16 1140 98 F (36.7 C)     Temp Source 04/08/16 1140 Oral     SpO2 04/08/16 1140 97 %     Weight 04/08/16 1140 192 lb (87.091 kg)     Height 04/08/16 1140 6' (1.829 m)     Head Cir --      Peak Flow --      Pain Score 04/08/16 1141 5     Pain Loc --      Pain Edu? --      Excl. in GC? --     Constitutional: Alert and oriented. Well appearing and in no acute distress. Eyes: Conjunctivae are normal. PERRL. EOMI. Head: Atraumatic. Nose: No congestion/rhinnorhea. Mouth/Throat: Mucous membranes are moist.  Oropharynx non-erythematous. Neck: No stridor.   Nontender with no meningismus Cardiovascular: Normal rate, regular rhythm. Grossly normal heart sounds.  Good peripheral circulation. Respiratory: Normal respiratory effort.  No retractions. Lungs CTAB. Abdominal: Soft and nontender. No distention. No guarding no rebound Back:  There is no focal tenderness or step off there is no midline tenderness there are no lesions noted. there is no CVA tenderness GU: Bilaterally normal scars noted, there is minimal tenderness to palpation on the right where the patient is not actually complaining of any discomfort but there is no testicular pain scrotal pain scrotal masses or herniation noted. He has a normal external penis. No discharge or lesions. Palpation in the inguinal region trans-scrotal of the left inguinal region shows no evidence of herniation or pathology. It is not tender there.  Musculoskeletal: No lower extremity tenderness. No joint  effusions, no DVT signs strong distal pulses no edema Neurologic:  Normal speech and language. No gross focal neurologic deficits are appreciated.  Skin:  Skin is warm, dry and intact. No rash noted. Psychiatric: Mood and affect are normal. Speech and behavior are normal.  ____________________________________________   LABS (all labs ordered are listed, but only abnormal results are displayed)  Labs Reviewed  URINALYSIS COMPLETEWITH MICROSCOPIC ([facility] ONLY) - Abnormal; Notable for the following:    Color, Urine YELLOW (*)    APPearance CLEAR (*)    All other components within normal limits   ____________________________________________  EKG  I  personally interpreted any EKGs ordered by me or triage  ____________________________________________  RADIOLOGY  I reviewed any imaging ordered by me or triage that were performed during my shift and, if possible, patient and/or family made aware of any abnormal findings. ____________________________________________   PROCEDURES  Procedure(s) performed: None  Critical Care performed: None  ____________________________________________   INITIAL IMPRESSION / ASSESSMENT AND PLAN / ED COURSE  Pertinent labs & imaging results that were available during my care of the patient were reviewed by me and considered in my medical decision making (see chart for details).  Patient here with concern about possible reevaluation of a hernia but which she is very anxious. At this time there is no evidence of incarcerated hernia when I have him bear down I cannot elicit any significant herniation in any area. I've advised her to follow closely with his surgeon as an outpatient and he will do so. In addition, patient states sometimes when he lists weights he has back spasms and he is not having them today he is neurologically intact there is no evidence of cauda equina syndrome, there is no complaint of incontinence of bowel or bladder, there is no  evidence that this is referred chest pain, he has no abdominal discomfort there is nothing to suggest AAA or aneurysm there is nothing to suggest dissection there is nothing to suggest PE, patient refuses any further workup at this time stating he must leave to go pick up his girlfriend, he understands limitation this placed upon me for working him up for his little different complaints at this time he has at least a reassuring exam and close follow-up. Return precautions and follow-up given and understood. ____________________________________________   FINAL CLINICAL IMPRESSION(S) / ED DIAGNOSES  Final diagnoses:  None      This chart was dictated using voice recognition software.  Despite best efforts to proofread,  errors can occur which can change meaning.     Jeanmarie Plant, MD 04/08/16 1501

## 2016-04-08 NOTE — ED Notes (Signed)
Pt reports he must leave ED due to a ride situation. Pt ambulatory and in NAD at this time. Pt reports he will follow up with [facility] or return to ED with worsening symptoms.

## 2016-04-08 NOTE — Discharge Instructions (Signed)
You would prefer not to stay for further work up.  This is certainly your choice but if you have increased pain, fever, chest pain, shortness of breath, abdominal pain, increased discomfort in that area, pain with urination, fever, chills, or you feel worse in any way, return to the emergency department. Follow closely with your surgeon and primary care doctor.

## 2016-04-08 NOTE — ED Notes (Addendum)
Pt reports left flank pain with left groin pain, pt reports having chronic back pain

## 2016-07-02 ENCOUNTER — Emergency Department
Admission: EM | Admit: 2016-07-02 | Discharge: 2016-07-02 | Disposition: A | Discharge status: Home / Self Care | Payer: Self-pay | Attending: Emergency Medicine | Admitting: Emergency Medicine

## 2016-07-02 ENCOUNTER — Emergency Department: Payer: Self-pay

## 2016-07-02 DIAGNOSIS — Z791 Long term (current) use of non-steroidal anti-inflammatories (NSAID): Secondary | ICD-10-CM | POA: Insufficient documentation

## 2016-07-02 DIAGNOSIS — N5082 Scrotal pain: Secondary | ICD-10-CM | POA: Insufficient documentation

## 2016-07-02 DIAGNOSIS — Z79899 Other long term (current) drug therapy: Secondary | ICD-10-CM | POA: Insufficient documentation

## 2016-07-02 DIAGNOSIS — R300 Dysuria: Secondary | ICD-10-CM | POA: Insufficient documentation

## 2016-07-02 DIAGNOSIS — F17210 Nicotine dependence, cigarettes, uncomplicated: Secondary | ICD-10-CM | POA: Insufficient documentation

## 2016-07-02 LAB — BASIC METABOLIC PANEL
Anion gap: 8 (ref 5–15)
BUN: 11 mg/dL (ref 6–20)
CO2: 22 mmol/L (ref 22–32)
Calcium: 9 mg/dL (ref 8.9–10.3)
Chloride: 107 mmol/L (ref 101–111)
Creatinine, Ser: 1.09 mg/dL (ref 0.61–1.24)
GFR calc Af Amer: >60 mL/min (ref >60)
GFR calc non Af Amer: >60 mL/min (ref >60)
Glucose, Bld: 109 mg/dL — ABNORMAL HIGH (ref 65–99)
Potassium: 3.7 mmol/L (ref 3.5–5.1)
Sodium: 137 mmol/L (ref 135–145)

## 2016-07-02 LAB — CBC
HCT: 45.8 % (ref 40.0–52.0)
Hemoglobin: 15.6 g/dL (ref 13.0–18.0)
MCH: 28.7 pg (ref 26.0–34.0)
MCHC: 34 g/dL (ref 32.0–36.0)
MCV: 84.5 fL (ref 80.0–100.0)
Platelets: 236 10*3/uL (ref 150–440)
RBC: 5.43 MIL/uL (ref 4.40–5.90)
RDW: 14.1 % (ref 11.5–14.5)
WBC: 4.2 10*3/uL (ref 3.8–10.6)

## 2016-07-02 LAB — URINALYSIS COMPLETE WITH MICROSCOPIC (ARMC ONLY)
Bacteria, UA: NONE SEEN
Bilirubin Urine: NEGATIVE
Glucose, UA: NEGATIVE mg/dL
Hgb urine dipstick: NEGATIVE
Ketones, ur: NEGATIVE mg/dL
Leukocytes, UA: NEGATIVE
Nitrite: NEGATIVE
Protein, ur: NEGATIVE mg/dL
Specific Gravity, Urine: 1.006 (ref 1.005–1.030)
Squamous Epithelial / LPF: NONE SEEN
pH: 6 (ref 5.0–8.0)

## 2016-07-02 LAB — CHLAMYDIA/NGC RT PCR (ARMC ONLY)
Chlamydia Tr: NOT DETECTED
N gonorrhoeae: NOT DETECTED

## 2016-07-02 MED ORDER — KETOROLAC TROMETHAMINE 60 MG/2ML IM SOLN
30.0000 mg | Freq: Once | INTRAMUSCULAR | Status: AC
  Administered 2016-07-02: 30 mg via INTRAMUSCULAR

## 2016-07-02 MED ORDER — KETOROLAC TROMETHAMINE 30 MG/ML IJ SOLN
30.0000 mg | Freq: Once | INTRAMUSCULAR | Status: DC
  Filled 2016-07-02: qty 1

## 2016-07-02 NOTE — ED Triage Notes (Signed)
Pt c/o left sided groin pain for the past several months, with a hx of inguinal hernia repair on the right side.the patient also c/o Bl flank pain with painful urination.the patient is in NAD , N/V/D..Marland Kitchen

## 2016-07-02 NOTE — Discharge Instructions (Signed)
Follow-up with your surgeon in a week. Follow up with your primary care doctor in 2 days. Return to the emergency department if you have abdominal pain, abdominal distention, swelling/redness/pain in your testicles or scrotum, or any other symptoms concerning to you.

## 2016-07-02 NOTE — ED Provider Notes (Signed)
[facility] Emergency Department Provider Note  ____________________________________________  Time seen: Approximately 10:09 AM  I have reviewed the triage vital signs and the nursing notes.   HISTORY  Chief Complaint Groin Pain   HPI Joseph Strong is a 45 y.o. male with history of bilateral inguinal hernia who presents for evaluation of left scrotum pain. Patient reports that he has had this pain on and off for over a year. In November 2016 he had a negative CT and ultrasound of the scrotum with no evidence of hernia. Patient reports that for the last month the pain has been getting more pronounced and frequent. He describes the pain as a 7/10, throbbing, located in his left testicle, radiating down his left leg. He also endorses dysuria for the last week. He denies penile discharge. Has a remote history of STDs in his teens. He reports that he is sexually active without condoms with one partner only. He denies abdominal pain, nausea, vomiting, constipation, he is passing flatus. He reports that he had a right-sided inguinal hernia repair in February and one on the left 20 years ago. He hasn't taken anything at home for the pain.  Past Medical History:  Diagnosis Date  . Burning with urination 01-10-16  . Constipation   . Dysrhythmia    "OCCASSIONAL SPEEDING UP OF HEART RATE" PT THINKING IT MAY BE DUE TO HERNIA  . Headache    MIGRAINES  . Inguinal hernia    left     Patient Active Problem List   Diagnosis Date Noted  . Decreased frequency of bowel movements 01/10/2016  . Left paraspinal back pain 12/08/2015  . Dizziness, nonspecific 10/31/2015  . Abnormal TSH 10/31/2015  . Left inguinal pain 10/31/2015  . Abnormal EKG 10/31/2015  . Annual physical exam 09/20/2015  . Testicular pain, left 09/20/2015  . Unilateral inguinal hernia without obstruction or gangrene 09/20/2015  . S/P unilateral inguinal hernia repair 09/20/2015  . Bacteria in urine  09/06/2015    Past Surgical History:  Procedure Laterality Date  . HERNIA REPAIR Left 1998   AND UMBILICAL  . INGUINAL HERNIA REPAIR Right 01/11/2016   Procedure: HERNIA REPAIR INGUINAL ADULT;  Surgeon: Kieth Brightly, MD;  Location: [facility] ORS;  Service: General;  Laterality: Right;  . TONSILLECTOMY     childhood    Prior to Admission medications   Medication Sig Start Date End Date Taking? Authorizing Provider  acetaminophen (TYLENOL) 500 MG tablet Take 500 mg by mouth every 6 (six) hours as needed for mild pain.   Yes Historical Provider, MD  Multiple Vitamin (MULTIVITAMIN) tablet Take 1 tablet by mouth daily.   Yes Historical Provider, MD    Allergies Review of patient's allergies indicates no known allergies.  Family History  Problem Relation Age of Onset  . Hypertension Maternal Grandmother     Social History Social History  Substance Use Topics  . Smoking status: Current Some Day Smoker    Types: Cigarettes  . Smokeless tobacco: Never Used  . Alcohol use 0.0 oz/week     Comment: occasional    Review of Systems  Constitutional: Negative for fever. Eyes: Negative for visual changes. ENT: Negative for sore throat. Cardiovascular: Negative for chest pain. Respiratory: Negative for shortness of breath. Gastrointestinal: Negative for abdominal pain, vomiting or diarrhea. Genitourinary: + dysuria and L scrotum pain Musculoskeletal: Negative for back pain. Skin: Negative for rash. Neurological: Negative for headaches, weakness or numbness.  ____________________________________________   PHYSICAL EXAM:  VITAL SIGNS:  ED Triage Vitals  Enc Vitals Group     BP 07/02/16 0910 134/84     Pulse Rate 07/02/16 0910 75     Resp 07/02/16 0910 16     Temp 07/02/16 0910 97.5 F (36.4 C)     Temp Source 07/02/16 0910 Oral     SpO2 07/02/16 0910 100 %     Weight 07/02/16 0910 175 lb (79.4 kg)     Height 07/02/16 0910 6' (1.829 m)     Head Circumference --       Peak Flow --      Pain Score 07/02/16 0912 8     Pain Loc --      Pain Edu? --      Excl. in GC? --     Constitutional: Alert and oriented. Well appearing and in no apparent distress. HEENT:      Head: Normocephalic and atraumatic.         Eyes: Conjunctivae are normal. Sclera is non-icteric. EOMI. PERRL      Mouth/Throat: Mucous membranes are moist.       Neck: Supple with no signs of meningismus. Cardiovascular: Regular rate and rhythm. No murmurs, gallops, or rubs. 2+ symmetrical distal pulses are present in all extremities. No JVD. Respiratory: Normal respiratory effort. Lungs are clear to auscultation bilaterally. No wheezes, crackles, or rhonchi.  Gastrointestinal: Soft, non tender, and non distended with positive bowel sounds. No rebound or guarding. Genitourinary: No CVA tenderness. No evidence of inguinal hernia with palpation of bilateral inguinal canals with patient laying flat, standing up, and bearing down. Normal scrotum with no rash, no swelling, no erythema, bilateral descended testes with positive cremasteric reflex and no tenderness to palpation. Musculoskeletal: Nontender with normal range of motion in all extremities. No edema, cyanosis, or erythema of extremities. Neurologic: Normal speech and language. Face is symmetric. Moving all extremities. No gross focal neurologic deficits are appreciated. Skin: Skin is warm, dry and intact. No rash noted. Psychiatric: Mood and affect are normal. Speech and behavior are normal.  ____________________________________________   LABS (all labs ordered are listed, but only abnormal results are displayed)  Labs Reviewed  URINALYSIS COMPLETEWITH MICROSCOPIC ([facility] ONLY) - Abnormal; Notable for the following:       Result Value   Color, Urine STRAW (*)    APPearance CLEAR (*)    All other components within normal limits  BASIC METABOLIC PANEL - Abnormal; Notable for the following:    Glucose, Bld 109 (*)    All other components  within normal limits  CHLAMYDIA/NGC RT PCR ([facility] ONLY)  CBC   ____________________________________________  EKG  none ____________________________________________  RADIOLOGY  Scrotum US:  Normal sonographic appearance of the bilateral testes.  Small right hydrocele.  No evidence of testicular torsion or inguinal hernia. ____________________________________________   PROCEDURES  Procedure(s) performed: None Procedures Critical Care performed:  None ____________________________________________   INITIAL IMPRESSION / ASSESSMENT AND PLAN / ED COURSE  45 y.o. male with history of bilateral inguinal hernia who presents for evaluation of left scrotum pain that has been present for over a year however worse in the last month and now radiating down his left leg. On my exam there is no abnormalities on his scrotum or testicles. There is no evidence of inguinal hernia, testicular torsion. We'll pursue with a ultrasound to rule out a small defect on the fascia and also evidence of epididymitis. UA here with no evidence of urinary tract infection. We'll send gonorrhea and chlamydia. We'll treat  his pain with Toradol.  Clinical Course  Comment By Time  Spoke with Dr. Charline Bills, radiologist who reports that he is able to see the L inguinal canal and does not see any evidence of bowel through the inguinal canal.   Nita Sickle, MD 08/08 1221  Patient has no pain. Exam remains unchanged with no evidence of inguinal hernia. His labs are within normal limits. We'll discharge and refer patient back to his surgeon for follow-up. I have discussed signs and symptoms of an inguinal hernia with the patient and recommended that he return to the emergency department. Gonorrhea and chlamydia are pending from the urine.  I discussed my evaluation of the patient's symptoms, my clinical impression, and my proposed outpatient treatment plan with patient. We have discussed anticipatory  guidance, scheduled follow-up, and careful return precautions. The patient expresses understanding and is comfortable with the discharge plan. All patient's questions were answered.  Nita Sickle, MD 08/08 1254    Pertinent labs & imaging results that were available during my care of the patient were reviewed by me and considered in my medical decision making (see chart for details).    ____________________________________________   FINAL CLINICAL IMPRESSION(S) / ED DIAGNOSES  Final diagnoses:  Pain in scrotum      NEW MEDICATIONS STARTED DURING THIS VISIT:  New Prescriptions   No medications on file     Note:  This document was prepared using Dragon voice recognition software and may include unintentional dictation errors.    Nita Sickle, MD 07/02/16 1258

## 2016-07-02 NOTE — ED Notes (Signed)
Patient transported to Ultrasound 

## 2016-09-10 ENCOUNTER — Ambulatory Visit: Payer: PRIVATE HEALTH INSURANCE | Admitting: Family Medicine

## 2016-09-17 ENCOUNTER — Ambulatory Visit: Payer: PRIVATE HEALTH INSURANCE | Admitting: Family Medicine

## 2016-09-19 ENCOUNTER — Ambulatory Visit: Payer: PRIVATE HEALTH INSURANCE | Admitting: Family Medicine

## 2016-09-25 ENCOUNTER — Ambulatory Visit: Payer: PRIVATE HEALTH INSURANCE | Admitting: Family Medicine

## 2016-09-30 ENCOUNTER — Ambulatory Visit (INDEPENDENT_AMBULATORY_CARE_PROVIDER_SITE_OTHER): Payer: PRIVATE HEALTH INSURANCE | Admitting: Family Medicine

## 2016-09-30 ENCOUNTER — Encounter: Payer: Self-pay | Admitting: Family Medicine

## 2016-09-30 VITALS — BP 110/68 | HR 93 | Temp 98.2°F | Resp 18 | Ht 72.0 in | Wt 173.9 lb

## 2016-09-30 DIAGNOSIS — R1030 Lower abdominal pain, unspecified: Secondary | ICD-10-CM | POA: Diagnosis not present

## 2016-09-30 DIAGNOSIS — R1032 Left lower quadrant pain: Principal | ICD-10-CM

## 2016-09-30 DIAGNOSIS — R1031 Right lower quadrant pain: Secondary | ICD-10-CM

## 2016-09-30 MED ORDER — GABAPENTIN 100 MG PO CAPS: 100.0000 mg | ORAL_CAPSULE | Freq: Three times a day (TID) | ORAL | 1 refills | Status: DC

## 2016-09-30 NOTE — Progress Notes (Signed)
Name: Joseph Strong   MRN: [id number]    DOB: [birth year]   Date:09/30/2016       Progress Note  Subjective  Chief Complaint  Chief Complaint  Patient presents with  . Flank Pain    pt complains of numbness in left side radiating towards knee     HPI  Pt. Presents for evaluation of persistent pain and discomfort described as 'soreness' in the right groin area, present since his hernia repair in February 2017. He had a pervious hernia repair on the left side in 1998 and that feels like an 'ache' often felt towards the testicle area. Pain is intermittent, sometimes sharp, other times less severe. Sometimes pain radiates down into the inner thighs. Patient had an ultrasound of scrotum in August which was unrevealing except for a small right hydrocele     Past Medical History:  Diagnosis Date  . Burning with urination 01-10-16  . Constipation   . Dysrhythmia    "OCCASSIONAL SPEEDING UP OF HEART RATE" PT THINKING IT MAY BE DUE TO HERNIA  . Headache    MIGRAINES  . Inguinal hernia    left     Past Surgical History:  Procedure Laterality Date  . HERNIA REPAIR Left 1998   AND UMBILICAL  . INGUINAL HERNIA REPAIR Right 01/11/2016   Procedure: HERNIA REPAIR INGUINAL ADULT;  Surgeon: Kieth Brightly, MD;  Location: [facility] ORS;  Service: General;  Laterality: Right;  . TONSILLECTOMY     childhood    Family History  Problem Relation Age of Onset  . Hypertension Maternal Grandmother     Social History   Social History  . Marital status: Single    Spouse name: N/A  . Number of children: N/A  . Years of education: N/A   Occupational History  . Not on file.   Social History Main Topics  . Smoking status: Current Some Day Smoker    Types: Cigarettes  . Smokeless tobacco: Never Used  . Alcohol use 0.0 oz/week     Comment: occasional  . Drug use: No  . Sexual activity: Yes   Other Topics Concern  . Not on file   Social History Narrative  . No narrative on file      Current Outpatient Prescriptions:  .  acetaminophen (TYLENOL) 500 MG tablet, Take 500 mg by mouth every 6 (six) hours as needed for mild pain., Disp: , Rfl:  .  Multiple Vitamin (MULTIVITAMIN) tablet, Take 1 tablet by mouth daily., Disp: , Rfl:   No Known Allergies   Review of Systems  Gastrointestinal: Negative for abdominal pain, nausea and vomiting.  Genitourinary: Negative for dysuria, frequency and hematuria.    Objective  Vitals:   09/30/16 1556  BP: 110/68  Pulse: 93  Resp: 18  Temp: 98.2 F (36.8 C)  TempSrc: Oral  SpO2: 97%  Weight: 173 lb 14.4 oz (78.9 kg)  Height: 6' (1.829 m)    Physical Exam  Constitutional: He is well-developed, well-nourished, and in no distress.  Abdominal: No hernia. Hernia confirmed negative in the right inguinal area and confirmed negative in the left inguinal area.  Genitourinary: Penis normal.  Genitourinary Comments: Normal GU exam, normal penis, pain is felt to be coming from the skin tissue at the base of the penis more towards the inside.  Nursing note and vitals reviewed.   Assessment & Plan  1. Inguinal pain of both sides Likely postoperative neuropathic pain, given normal imaging and reassuring exam. I suspect that the  pain is postoperative in nature. We'll start on gabapentin 100 mg 3 times daily for relief. Obtain urine and baseline lab work. - gabapentin (NEURONTIN) 100 MG capsule; Take 1 capsule (100 mg total) by mouth 3 (three) times daily.  Dispense: 90 capsule; Refill: 1 - CBC with Differential - COMPLETE METABOLIC PANEL WITH GFR - Urinalysis, Routine w reflex microscopic   Jonae Renshaw [facility] 09/30/2016 4:03 PM

## 2016-10-01 LAB — CBC WITH DIFFERENTIAL/PLATELET
Basophils Absolute: 0 cells/uL (ref 0–200)
Basophils Relative: 0 %
Eosinophils Absolute: 104 cells/uL (ref 15–500)
Eosinophils Relative: 2 %
HCT: 46 % (ref 38.5–50.0)
Hemoglobin: 15.2 g/dL (ref 13.2–17.1)
Lymphocytes Relative: 34 %
Lymphs Abs: 1768 cells/uL (ref 850–3900)
MCH: 28.3 pg (ref 27.0–33.0)
MCHC: 33 g/dL (ref 32.0–36.0)
MCV: 85.7 fL (ref 80.0–100.0)
MPV: 9.8 fL (ref 7.5–12.5)
Monocytes Absolute: 676 cells/uL (ref 200–950)
Monocytes Relative: 13 %
Neutro Abs: 2652 cells/uL (ref 1500–7800)
Neutrophils Relative %: 51 %
Platelets: 291 10*3/uL (ref 140–400)
RBC: 5.37 MIL/uL (ref 4.20–5.80)
RDW: 14.1 % (ref 11.0–15.0)
WBC: 5.2 10*3/uL (ref 3.8–10.8)

## 2016-10-01 LAB — COMPLETE METABOLIC PANEL WITH GFR
ALT: 13 U/L (ref 9–46)
AST: 16 U/L (ref 10–40)
Albumin: 4 g/dL (ref 3.6–5.1)
Alkaline Phosphatase: 48 U/L (ref 40–115)
BUN: 13 mg/dL (ref 7–25)
CO2: 23 mmol/L (ref 20–31)
Calcium: 9.3 mg/dL (ref 8.6–10.3)
Chloride: 106 mmol/L (ref 98–110)
Creat: 1.18 mg/dL (ref 0.60–1.35)
GFR, Est African American: 86 mL/min (ref >=60)
GFR, Est Non African American: 74 mL/min (ref >=60)
Glucose, Bld: 91 mg/dL (ref 65–99)
Potassium: 4.2 mmol/L (ref 3.5–5.3)
Sodium: 138 mmol/L (ref 135–146)
Total Bilirubin: 0.5 mg/dL (ref 0.2–1.2)
Total Protein: 6.7 g/dL (ref 6.1–8.1)

## 2016-10-01 LAB — URINALYSIS, ROUTINE W REFLEX MICROSCOPIC
Bilirubin Urine: NEGATIVE
Glucose, UA: NEGATIVE
Hgb urine dipstick: NEGATIVE
Ketones, ur: NEGATIVE
Leukocytes, UA: NEGATIVE
Nitrite: NEGATIVE
Protein, ur: NEGATIVE
Specific Gravity, Urine: 1.022 (ref 1.001–1.035)
pH: 5.5 (ref 5.0–8.0)

## 2016-11-05 ENCOUNTER — Encounter: Payer: Self-pay | Admitting: Family Medicine

## 2016-11-05 ENCOUNTER — Ambulatory Visit (INDEPENDENT_AMBULATORY_CARE_PROVIDER_SITE_OTHER): Payer: PRIVATE HEALTH INSURANCE | Admitting: Family Medicine

## 2016-11-05 VITALS — BP 108/68 | HR 82 | Temp 98.6°F | Resp 16 | Ht 72.0 in | Wt 179.1 lb

## 2016-11-05 DIAGNOSIS — Z0000 Encounter for general adult medical examination without abnormal findings: Secondary | ICD-10-CM

## 2016-11-05 NOTE — Progress Notes (Signed)
Name: Jadd Lindemann   MRN: [id number]    DOB: 1970/12/01   Date:11/05/2016       Progress Note  Subjective  Chief Complaint  Chief Complaint  Patient presents with  . Annual Exam    CPE    HPI  Pt. presents for a Complete Physical Exam. He is doing well.   Past Medical History:  Diagnosis Date  . Burning with urination 01-10-16  . Constipation   . Dysrhythmia    "OCCASSIONAL SPEEDING UP OF HEART RATE" PT THINKING IT MAY BE DUE TO HERNIA  . Headache    MIGRAINES  . Inguinal hernia    left     Past Surgical History:  Procedure Laterality Date  . HERNIA REPAIR Left 1998   AND UMBILICAL  . INGUINAL HERNIA REPAIR Right 01/11/2016   Procedure: HERNIA REPAIR INGUINAL ADULT;  Surgeon: Kieth Brightly, MD;  Location: [facility] ORS;  Service: General;  Laterality: Right;  . TONSILLECTOMY     childhood    Family History  Problem Relation Age of Onset  . Hypertension Maternal Grandmother     Social History   Social History  . Marital status: Single    Spouse name: N/A  . Number of children: N/A  . Years of education: N/A   Occupational History  . Not on file.   Social History Main Topics  . Smoking status: Current Some Day Smoker    Types: Cigarettes  . Smokeless tobacco: Never Used  . Alcohol use 0.0 oz/week     Comment: occasional  . Drug use: No  . Sexual activity: Yes   Other Topics Concern  . Not on file   Social History Narrative  . No narrative on file     Current Outpatient Prescriptions:  .  acetaminophen (TYLENOL) 500 MG tablet, Take 500 mg by mouth every 6 (six) hours as needed for mild pain., Disp: , Rfl:  .  gabapentin (NEURONTIN) 100 MG capsule, Take 1 capsule (100 mg total) by mouth 3 (three) times daily., Disp: 90 capsule, Rfl: 1 .  Multiple Vitamin (MULTIVITAMIN) tablet, Take 1 tablet by mouth daily., Disp: , Rfl:   No Known Allergies   Review of Systems  Constitutional: Negative for chills, fever and malaise/fatigue.  HENT:  Negative for congestion, ear pain, sinus pain and sore throat.   Eyes: Negative for blurred vision and double vision.  Respiratory: Negative for cough, sputum production and shortness of breath.   Cardiovascular: Positive for palpitations (occasional tight feeling in the chest). Negative for chest pain and leg swelling.  Gastrointestinal: Negative for abdominal pain, blood in stool, nausea (occasional episode of nausea, no vomiting) and vomiting.  Genitourinary: Positive for dysuria (occasional burning sensation when he urinates). Negative for frequency and hematuria.  Musculoskeletal: Negative for back pain, joint pain, myalgias and neck pain.  Skin: Negative for itching and rash (occasional rash on the left arm).  Neurological: Negative for dizziness and headaches.  Psychiatric/Behavioral: Negative for depression. The patient is not nervous/anxious and does not have insomnia.       Objective  Vitals:   11/05/16 1609  BP: 108/68  Pulse: 82  Resp: 16  Temp: 98.6 F (37 C)  TempSrc: Oral  SpO2: 96%  Weight: 179 lb 1.6 oz (81.2 kg)  Height: 6' (1.829 m)    Physical Exam  Constitutional: He is oriented to person, place, and time and well-developed, well-nourished, and in no distress.  HENT:  Head: Normocephalic and atraumatic.  Eyes: Pupils  are equal, round, and reactive to light.  Neck: Neck supple.  Cardiovascular: Normal rate, regular rhythm and normal heart sounds.   No murmur heard. Pulmonary/Chest: Effort normal and breath sounds normal. He has no wheezes.  Abdominal: Soft. Bowel sounds are normal. There is no tenderness. There is no rebound.  Musculoskeletal: He exhibits no edema.  Neurological: He is alert and oriented to person, place, and time.  Psychiatric: Mood, memory, affect and judgment normal.  Nursing note and vitals reviewed.      Assessment & Plan  1. Annual physical exam Obtain age appropriate routine screenings, point-of-care A1c is 5.4%,  considered normal - CBC with Differential - COMPLETE METABOLIC PANEL WITH GFR - Lipid Profile - PSA - TSH - Vitamin D (25 hydroxy) - POCT HgB A1C   Doniven Vanpatten [facility] 11/05/2016 4:19 PM

## 2016-11-06 ENCOUNTER — Telehealth: Payer: Self-pay | Admitting: Family Medicine

## 2016-11-06 ENCOUNTER — Encounter: Payer: Self-pay | Admitting: Family Medicine

## 2016-11-06 LAB — CBC WITH DIFFERENTIAL/PLATELET
Basophils Absolute: 45 cells/uL (ref 0–200)
Basophils Relative: 1 %
Eosinophils Absolute: 45 cells/uL (ref 15–500)
Eosinophils Relative: 1 %
HCT: 48.5 % (ref 38.5–50.0)
Hemoglobin: 15.9 g/dL (ref 13.2–17.1)
Lymphocytes Relative: 41 %
Lymphs Abs: 1845 cells/uL (ref 850–3900)
MCH: 28.4 pg (ref 27.0–33.0)
MCHC: 32.8 g/dL (ref 32.0–36.0)
MCV: 86.8 fL (ref 80.0–100.0)
MPV: 10 fL (ref 7.5–12.5)
Monocytes Absolute: 540 cells/uL (ref 200–950)
Monocytes Relative: 12 %
Neutro Abs: 2025 cells/uL (ref 1500–7800)
Neutrophils Relative %: 45 %
Platelets: 317 10*3/uL (ref 140–400)
RBC: 5.59 MIL/uL (ref 4.20–5.80)
RDW: 13.6 % (ref 11.0–15.0)
WBC: 4.5 10*3/uL (ref 3.8–10.8)

## 2016-11-06 LAB — COMPLETE METABOLIC PANEL WITH GFR
ALT: 14 U/L (ref 9–46)
AST: 16 U/L (ref 10–40)
Albumin: 4 g/dL (ref 3.6–5.1)
Alkaline Phosphatase: 46 U/L (ref 40–115)
BUN: 12 mg/dL (ref 7–25)
CO2: 23 mmol/L (ref 20–31)
Calcium: 9.4 mg/dL (ref 8.6–10.3)
Chloride: 107 mmol/L (ref 98–110)
Creat: 1.18 mg/dL (ref 0.60–1.35)
GFR, Est African American: 86 mL/min (ref >=60)
GFR, Est Non African American: 74 mL/min (ref >=60)
Glucose, Bld: 79 mg/dL (ref 65–99)
Potassium: 4.2 mmol/L (ref 3.5–5.3)
Sodium: 139 mmol/L (ref 135–146)
Total Bilirubin: 0.7 mg/dL (ref 0.2–1.2)
Total Protein: 6.9 g/dL (ref 6.1–8.1)

## 2016-11-06 LAB — TSH: TSH: 0.56 mIU/L (ref 0.40–4.50)

## 2016-11-06 LAB — LIPID PANEL
Cholesterol: 161 mg/dL (ref <200)
HDL: 69 mg/dL (ref >40)
LDL Cholesterol: 84 mg/dL (ref <100)
Total CHOL/HDL Ratio: 2.3 Ratio (ref <5.0)
Triglycerides: 38 mg/dL (ref <150)
VLDL: 8 mg/dL (ref <30)

## 2016-11-06 LAB — PSA: PSA: 0.7 ng/mL (ref <=4.0)

## 2016-11-06 NOTE — Telephone Encounter (Signed)
Patient was seen on yesterday and states that you where going to check on him getting an MRI. Also, patient states that a few months ago you where going to refer him to neurologist and as of today he has not heard anything. Please return call to discuss

## 2016-11-06 NOTE — Telephone Encounter (Signed)
Returned call and left a voice message. We discussed getting an MRI of pelvic area if his discomfort in the left groin continues. We will need to discuss referral to neurology.

## 2016-11-07 LAB — POCT GLYCOSYLATED HEMOGLOBIN (HGB A1C): Hemoglobin A1C: 5.4

## 2016-11-07 LAB — VITAMIN D 25 HYDROXY (VIT D DEFICIENCY, FRACTURES): Vit D, 25-Hydroxy: 35 ng/mL (ref 30–100)

## 2016-11-12 ENCOUNTER — Encounter: Payer: PRIVATE HEALTH INSURANCE | Admitting: Family Medicine

## 2016-11-19 ENCOUNTER — Telehealth: Payer: Self-pay | Admitting: Family Medicine

## 2016-11-19 NOTE — Telephone Encounter (Signed)
Pt said that Myriam JacobsonHelen you told him to call back and talk with you about a referral for MRI. PLEASE ADVISE

## 2016-11-22 NOTE — Telephone Encounter (Signed)
Joseph Strong HAS CALLED BACK VERY UPSET THAT NO ONE HAS CALLED HIM BACK ABOUT GETTING A MRI . I DO NOT UNDERSTAND ALL THAT HE IS SPEAKING OF. PLEASE CALL HIM AND SEE IF YOU CAN HELP HIM.

## 2016-11-27 NOTE — Telephone Encounter (Signed)
Routed to Dr. Sherryll BurgerShah for patient advice and referral, we have attempted to call patient several times and only receive voicemail then patient calls back several days later with  Complaints of no one returning his call, not clear as we cannot understand his conversations as to what he is needing, pls call patient

## 2016-11-28 NOTE — Telephone Encounter (Signed)
Returned call and left a voice message for the patient. 

## 2016-11-29 ENCOUNTER — Telehealth: Payer: Self-pay | Admitting: Family Medicine

## 2016-11-29 NOTE — Telephone Encounter (Signed)
Pt returned your call but you where at lunch. Please return his call

## 2016-11-29 NOTE — Telephone Encounter (Signed)
Return: Left was message. I would like to talk to the patient in detail to discuss symptoms and to plan follow up evaluation, please schedule for an appointment in 1-2 weeks.

## 2016-11-29 NOTE — Telephone Encounter (Signed)
Returned call and left a voice message for the patient. Please schedule for an appointment in 1-2 weeks to discuss his symptoms and plan follow-up evaluation

## 2016-11-29 NOTE — Telephone Encounter (Signed)
Dr. Sherryll BurgerShah will speak to patient if he returns call

## 2016-11-29 NOTE — Telephone Encounter (Signed)
Pt is asking if you do not get him for he is at work just leave him a detailed message.

## 2016-12-03 NOTE — Telephone Encounter (Signed)
Patient called back and made appointment for 12/12/16

## 2016-12-12 ENCOUNTER — Ambulatory Visit: Payer: Commercial Managed Care - HMO | Admitting: Family Medicine

## 2016-12-20 ENCOUNTER — Ambulatory Visit (INDEPENDENT_AMBULATORY_CARE_PROVIDER_SITE_OTHER): Payer: Commercial Managed Care - HMO | Admitting: Family Medicine

## 2016-12-20 ENCOUNTER — Encounter: Payer: Self-pay | Admitting: Family Medicine

## 2016-12-20 VITALS — BP 110/77 | HR 94 | Temp 97.7°F | Resp 17 | Ht 72.0 in | Wt 176.5 lb

## 2016-12-20 DIAGNOSIS — R1033 Periumbilical pain: Secondary | ICD-10-CM | POA: Diagnosis not present

## 2016-12-20 DIAGNOSIS — R1032 Left lower quadrant pain: Secondary | ICD-10-CM

## 2016-12-20 LAB — CBC WITH DIFFERENTIAL/PLATELET
Basophils Absolute: 0 cells/uL (ref 0–200)
Basophils Relative: 0 %
Eosinophils Absolute: 94 cells/uL (ref 15–500)
Eosinophils Relative: 2 %
HCT: 47.4 % (ref 38.5–50.0)
Hemoglobin: 15.6 g/dL (ref 13.2–17.1)
Lymphocytes Relative: 41 %
Lymphs Abs: 1927 cells/uL (ref 850–3900)
MCH: 28.1 pg (ref 27.0–33.0)
MCHC: 32.9 g/dL (ref 32.0–36.0)
MCV: 85.4 fL (ref 80.0–100.0)
MPV: 10.1 fL (ref 7.5–12.5)
Monocytes Absolute: 611 cells/uL (ref 200–950)
Monocytes Relative: 13 %
Neutro Abs: 2068 cells/uL (ref 1500–7800)
Neutrophils Relative %: 44 %
Platelets: 299 10*3/uL (ref 140–400)
RBC: 5.55 MIL/uL (ref 4.20–5.80)
RDW: 13.8 % (ref 11.0–15.0)
WBC: 4.7 10*3/uL (ref 3.8–10.8)

## 2016-12-20 NOTE — Progress Notes (Signed)
Name: Joseph Strong   MRN: [id number]    DOB: [birth year]   Date:12/20/2016       Progress Note  Subjective  Chief Complaint  Chief Complaint  Patient presents with  . Follow-up    MRI    HPI  Pt. presents for evaluation of multiple abdominal and pelvic complaints, he is mainly concerned about discomfort in his left and right groin areas, left upper quadrant, and in the middle back. He has complained about the pain in his left and right groin consistently, has history of bilateral inguinal hernia repair and still feels sharp attacks of pain occasionally on the left side of the abdomen.  He has no other pertinent symptoms including nausea, vomiting, , diarrhea or constipation, occasionally has burning on urination. He has had a CT scan A/P in November 2016, which identified a right inguinal hernia. Previously, he had surgery for umbilical hernia as a child and left inguinal hernia as a young man.    Past Medical History:  Diagnosis Date  . Burning with urination 01-10-16  . Constipation   . Dysrhythmia    "OCCASSIONAL SPEEDING UP OF HEART RATE" PT THINKING IT MAY BE DUE TO HERNIA  . Headache    MIGRAINES  . Inguinal hernia    left     Past Surgical History:  Procedure Laterality Date  . HERNIA REPAIR Left 1998   AND UMBILICAL  . INGUINAL HERNIA REPAIR Right 01/11/2016   Procedure: HERNIA REPAIR INGUINAL ADULT;  Surgeon: Kieth Brightly, MD;  Location: [facility] ORS;  Service: General;  Laterality: Right;  . TONSILLECTOMY     childhood    Family History  Problem Relation Age of Onset  . Hypertension Maternal Grandmother     Social History   Social History  . Marital status: Single    Spouse name: N/A  . Number of children: N/A  . Years of education: N/A   Occupational History  . Not on file.   Social History Main Topics  . Smoking status: Current Some Day Smoker    Types: Cigarettes  . Smokeless tobacco: Never Used  . Alcohol use 0.0 oz/week     Comment:  occasional  . Drug use: No  . Sexual activity: Yes   Other Topics Concern  . Not on file   Social History Narrative  . No narrative on file     Current Outpatient Prescriptions:  .  acetaminophen (TYLENOL) 500 MG tablet, Take 500 mg by mouth every 6 (six) hours as needed for mild pain., Disp: , Rfl:  .  gabapentin (NEURONTIN) 100 MG capsule, Take 1 capsule (100 mg total) by mouth 3 (three) times daily. (Patient not taking: Reported on 12/20/2016), Disp: 90 capsule, Rfl: 1 .  Multiple Vitamin (MULTIVITAMIN) tablet, Take 1 tablet by mouth daily., Disp: , Rfl:   No Known Allergies   ROS  Please see history of present illness for complete discussion of ROS  Objective  Vitals:   12/20/16 1551  BP: 110/77  Pulse: 94  Resp: 17  Temp: 97.7 F (36.5 C)  TempSrc: Oral  SpO2: 98%  Weight: 176 lb 8 oz (80.1 kg)  Height: 6' (1.829 m)    Physical Exam  Constitutional: He is oriented to person, place, and time and well-developed, well-nourished, and in no distress.  Cardiovascular: Normal rate, regular rhythm and normal heart sounds.   No murmur heard. Pulmonary/Chest: Effort normal and breath sounds normal. He has no wheezes.  Abdominal: Soft. Bowel sounds are  normal. There is no tenderness.  Neurological: He is alert and oriented to person, place, and time.  Skin: Skin is dry.  Psychiatric: Mood, memory, affect and judgment normal.  Nursing note and vitals reviewed.    Assessment & Plan  1. Abdominal pain, periumbilical Unclear etiology, patient has history of multiple hernia surgeries in abdomen and pelvis, has occasional sharp pains around the umbilical area and recurrent pain in the left and right groin. We will obtain MRI of abdomen and pelvis for evaluation of these painful episodes. Obtain CBC, CMP and urine analysis for complete workup. - MR ABDOMEN W WO CONTRAST; Future - CBC with Differential - COMPLETE METABOLIC PANEL WITH GFR - Urinalysis, Routine w reflex  microscopic - Urine Culture  2. Groin pain, left  - MR PELVIS W WO CONTRAST; Future  Citlali Gautney [facility] 12/20/2016 4:01 PM

## 2016-12-21 LAB — URINALYSIS, ROUTINE W REFLEX MICROSCOPIC
Bilirubin Urine: NEGATIVE
Glucose, UA: NEGATIVE
Hgb urine dipstick: NEGATIVE
Ketones, ur: NEGATIVE
Leukocytes, UA: NEGATIVE
Nitrite: NEGATIVE
Protein, ur: NEGATIVE
Specific Gravity, Urine: 1.018 (ref 1.001–1.035)
pH: 6 (ref 5.0–8.0)

## 2016-12-21 LAB — COMPLETE METABOLIC PANEL WITH GFR
ALT: 14 U/L (ref 9–46)
AST: 16 U/L (ref 10–40)
Albumin: 4.1 g/dL (ref 3.6–5.1)
Alkaline Phosphatase: 50 U/L (ref 40–115)
BUN: 15 mg/dL (ref 7–25)
CO2: 22 mmol/L (ref 20–31)
Calcium: 9.1 mg/dL (ref 8.6–10.3)
Chloride: 108 mmol/L (ref 98–110)
Creat: 1.26 mg/dL (ref 0.60–1.35)
GFR, Est African American: 79 mL/min (ref >=60)
GFR, Est Non African American: 68 mL/min (ref >=60)
Glucose, Bld: 88 mg/dL (ref 65–99)
Potassium: 4.2 mmol/L (ref 3.5–5.3)
Sodium: 139 mmol/L (ref 135–146)
Total Bilirubin: 0.4 mg/dL (ref 0.2–1.2)
Total Protein: 6.9 g/dL (ref 6.1–8.1)

## 2016-12-22 LAB — URINE CULTURE: Organism ID, Bacteria: NO GROWTH

## 2016-12-24 ENCOUNTER — Telehealth: Payer: Self-pay | Admitting: Family Medicine

## 2016-12-24 NOTE — Telephone Encounter (Signed)
Pt would like a call back about his lab results and his referral.

## 2016-12-26 ENCOUNTER — Telehealth: Payer: Self-pay

## 2016-12-26 NOTE — Telephone Encounter (Signed)
Contacted this patient to inform him that he has been scheduled to have his MRI of the pelvis w & w/o contrast on Tuesday, February 13 , 2018 at [facility] (medical mall) and he is to arrive at 2:30pm, but there was no answer. A message was left with this information and he was told to give us a call if he had any questions.

## 2016-12-31 ENCOUNTER — Telehealth: Payer: Self-pay

## 2016-12-31 NOTE — Telephone Encounter (Signed)
I called this patient to inform him that his MRI of the abdomen has been approved and that it has been scheduled for 01/09/17 @ 8am. He is to arrive at 7:30 at the medical mall entrance. Patient was asked to give Joseph Strong a call back to confirm he got the message and that he understands everything.

## 2017-01-07 ENCOUNTER — Ambulatory Visit
Admission: RE | Admit: 2017-01-07 | Discharge: 2017-01-07 | Disposition: A | Discharge status: Home / Self Care | Payer: 59 | Source: Ambulatory Visit | Attending: Family Medicine | Admitting: Family Medicine

## 2017-01-07 DIAGNOSIS — R1032 Left lower quadrant pain: Secondary | ICD-10-CM | POA: Insufficient documentation

## 2017-01-07 DIAGNOSIS — K4020 Bilateral inguinal hernia, without obstruction or gangrene, not specified as recurrent: Secondary | ICD-10-CM | POA: Diagnosis not present

## 2017-01-07 MED ORDER — GADOBENATE DIMEGLUMINE 529 MG/ML IV SOLN
15.0000 mL | Freq: Once | INTRAVENOUS | Status: AC | PRN
  Administered 2017-01-07: 15 mL via INTRAVENOUS

## 2017-01-09 ENCOUNTER — Ambulatory Visit
Admission: RE | Admit: 2017-01-09 | Discharge: 2017-01-09 | Disposition: A | Discharge status: Home / Self Care | Payer: 59 | Source: Ambulatory Visit | Attending: Family Medicine | Admitting: Family Medicine

## 2017-01-09 DIAGNOSIS — Q453 Other congenital malformations of pancreas and pancreatic duct: Secondary | ICD-10-CM | POA: Diagnosis not present

## 2017-01-09 DIAGNOSIS — Z9889 Other specified postprocedural states: Secondary | ICD-10-CM | POA: Insufficient documentation

## 2017-01-09 DIAGNOSIS — R1033 Periumbilical pain: Secondary | ICD-10-CM | POA: Diagnosis not present

## 2017-01-09 DIAGNOSIS — R1012 Left upper quadrant pain: Secondary | ICD-10-CM | POA: Diagnosis not present

## 2017-01-09 MED ORDER — GADOBENATE DIMEGLUMINE 529 MG/ML IV SOLN
15.0000 mL | Freq: Once | INTRAVENOUS | Status: AC | PRN
  Administered 2017-01-09: 15 mL via INTRAVENOUS

## 2017-01-15 ENCOUNTER — Telehealth: Payer: Self-pay | Admitting: Family Medicine

## 2017-01-15 NOTE — Telephone Encounter (Signed)
Pt states he would like to know what his results are for his MRI on abdomen.

## 2017-01-20 NOTE — Telephone Encounter (Signed)
Tameka notified patient

## 2017-03-18 ENCOUNTER — Encounter: Payer: Self-pay | Admitting: Family Medicine

## 2017-03-18 ENCOUNTER — Ambulatory Visit (INDEPENDENT_AMBULATORY_CARE_PROVIDER_SITE_OTHER): Payer: Commercial Managed Care - HMO | Admitting: Family Medicine

## 2017-03-18 VITALS — BP 108/70 | HR 82 | Temp 98.2°F | Resp 16 | Ht 72.0 in | Wt 176.3 lb

## 2017-03-18 DIAGNOSIS — R1032 Left lower quadrant pain: Secondary | ICD-10-CM | POA: Diagnosis not present

## 2017-03-18 DIAGNOSIS — G8929 Other chronic pain: Secondary | ICD-10-CM | POA: Diagnosis not present

## 2017-03-18 DIAGNOSIS — R300 Dysuria: Secondary | ICD-10-CM

## 2017-03-18 NOTE — Progress Notes (Signed)
Name: Joseph Strong   MRN: [id number]    DOB: 29-Oct-1971   Date:03/18/2017       Progress Note  Subjective  Chief Complaint  Chief Complaint  Patient presents with  . Referral    groin pain    HPI  Pt. Presents for ongoing concerns about recurrent left groin pain at the site of previous inguinal hernia repair mainy years ago, he feels the pain to be coming from 'deep within', intermittent, no exacerbating factors, relieved on its own but lately pain has been persistent, he also feels the pain in his umbilical area at the site of umbilical hernia repair.  He also reports feeling pressure in his rectum, is wondering if it may be his prostate. He sometimes gets up 1-2 times per night to urinate but believes that's when he drinks a lot of fluids. He also feels burning when urination.    Past Medical History:  Diagnosis Date  . Burning with urination 01-10-16  . Constipation   . Dysrhythmia    "OCCASSIONAL SPEEDING UP OF HEART RATE" PT THINKING IT MAY BE DUE TO HERNIA  . Headache    MIGRAINES  . Inguinal hernia    left     Past Surgical History:  Procedure Laterality Date  . HERNIA REPAIR Left 1998   AND UMBILICAL  . INGUINAL HERNIA REPAIR Right 01/11/2016   Procedure: HERNIA REPAIR INGUINAL ADULT;  Surgeon: Kieth Brightly, MD;  Location: [facility] ORS;  Service: General;  Laterality: Right;  . TONSILLECTOMY     childhood    Family History  Problem Relation Age of Onset  . Hypertension Maternal Grandmother     Social History   Social History  . Marital status: Single    Spouse name: N/A  . Number of children: N/A  . Years of education: N/A   Occupational History  . Not on file.   Social History Main Topics  . Smoking status: Current Some Day Smoker    Types: Cigarettes  . Smokeless tobacco: Never Used  . Alcohol use 0.0 oz/week     Comment: occasional  . Drug use: No  . Sexual activity: Yes   Other Topics Concern  . Not on file   Social History Narrative    . No narrative on file     Current Outpatient Prescriptions:  .  acetaminophen (TYLENOL) 500 MG tablet, Take 500 mg by mouth every 6 (six) hours as needed for mild pain., Disp: , Rfl:  .  gabapentin (NEURONTIN) 100 MG capsule, Take 1 capsule (100 mg total) by mouth 3 (three) times daily., Disp: 90 capsule, Rfl: 1 .  Multiple Vitamin (MULTIVITAMIN) tablet, Take 1 tablet by mouth daily., Disp: , Rfl:   No Known Allergies   ROS  Please see history of present illness for complete discussion of ROS  Objective  Vitals:   03/18/17 1549  BP: 108/70  Pulse: 82  Resp: 16  Temp: 98.2 F (36.8 C)  TempSrc: Oral  SpO2: 98%  Weight: 176 lb 4.8 oz (80 kg)  Height: 6' (1.829 m)    Physical Exam  Constitutional: He is oriented to person, place, and time and well-developed, well-nourished, and in no distress.  Cardiovascular: Normal rate, regular rhythm and normal heart sounds.   No murmur heard. Pulmonary/Chest: Effort normal and breath sounds normal. He has no wheezes.  Abdominal: Soft. Bowel sounds are normal. There is no tenderness. Hernia confirmed negative in the right inguinal area and confirmed negative in the left  inguinal area.    Surgical scar on the left groin, non tender, no palpable abdominal/pelvic abnormality  Neurological: He is alert and oriented to person, place, and time.  Nursing note and vitals reviewed.      Assessment & Plan  1. Burning with urination Obtain urinalysis, referral to urology for patient's concerns about urinary symptoms, unlikely to be prostate enlargement. - Ambulatory referral to Urology - Urinalysis, Routine w reflex microscopic  2. Chronic pain of left groin Advised that he should follow up with Dr. Evette Cristal for further assessment, patient reportedly had left inguinal hernia repair in 1998.   Jannis Atkins [facility] 03/18/2017 4:04 PM

## 2017-03-19 LAB — URINALYSIS, ROUTINE W REFLEX MICROSCOPIC
Bilirubin Urine: NEGATIVE
Glucose, UA: NEGATIVE
Hgb urine dipstick: NEGATIVE
Ketones, ur: NEGATIVE
Leukocytes, UA: NEGATIVE
Nitrite: NEGATIVE
Protein, ur: NEGATIVE
Specific Gravity, Urine: 1.002 (ref 1.001–1.035)
pH: 6 (ref 5.0–8.0)

## 2017-04-07 NOTE — Progress Notes (Signed)
04/08/2017 3:39 PM   Joseph Strong Fearing March 03, 1971 [id number]  Referring provider: Ellyn Hack, MD [address] [place], [place] [address]  Chief Complaint  Patient presents with  . New Patient (Initial Visit)    dysuria referred by Dr. Sherryll Burger  MD  [facility]    HPI: Patient is a 46 year old African American male who is referred by Dr. [name] for dysuria and chronic groin pain.    He has been having pain in his left suprapubic region for greater than one year.  8-9/10 pain.  "Pain that feels numb at the same time, it's hard to describe."   Pain comes and goes.  It can stay for one hour and a half.  Drinking fluids makes it worse.  Multi vitamin makes the pain better.   He states the pain does not cause nausea or vomiting.  He states the pain moves around his abdomen.  He states he sometime has cramping pain.  He admits to having constipation.  His UA was unremarkable.   He states he is sexually active.  He complains of a rash on his arm that occurs intermittently.    His IPSS score today is 3, which is mild lower urinary tract symptomatology. He is mixed with his quality life due to his urinary symptoms.  His PVR is 98 mL.   His major complaints today are dysuria off and on and nocturia.x 2  He has had these symptoms for the last two monts.  He denies any dysuria, hematuria or suprapubic pain.   He also denies any recent fevers, chills, nausea or vomiting. He does not have a family history of PCa.      IPSS    Row Name 04/08/17 1500         International Prostate Symptom Score   How often have you had the sensation of not emptying your bladder? Not at All     How often have you had to urinate less than every two hours? Not at All     How often have you found you stopped and started again several times when you urinated? Not at All     How often have you found it difficult to postpone urination? Not at All     How often have you had a weak urinary stream?  Less than 1 in 5 times     How often have you had to strain to start urination? Not at All     How many times did you typically get up at night to urinate? 2 Times     Total IPSS Score 3       Quality of Life due to urinary symptoms   If you were to spend the rest of your life with your urinary condition just the way it is now how would you feel about that? Mixed        Score:  1-7 Mild 8-19 Moderate 20-35 Severe  PMH: Past Medical History:  Diagnosis Date  . Burning with urination 01-10-16  . Constipation   . Dysrhythmia    "OCCASSIONAL SPEEDING UP OF HEART RATE" PT THINKING IT MAY BE DUE TO HERNIA  . Headache    MIGRAINES  . Inguinal hernia    left     Surgical History: Past Surgical History:  Procedure Laterality Date  . HERNIA REPAIR Left 1998   AND UMBILICAL  . HERNIA REPAIR     46 years of age  .  INGUINAL HERNIA REPAIR Right 01/11/2016   Procedure: HERNIA REPAIR INGUINAL ADULT;  Surgeon: Kieth Brightly, MD;  Location: [facility] ORS;  Service: General;  Laterality: Right;  . TONSILLECTOMY     childhood    Home Medications:  Allergies as of 04/08/2017   No Known Allergies     Medication List       Accurate as of 04/08/17  3:39 PM. Always use your most recent med list.          acetaminophen 500 MG tablet Commonly known as:  TYLENOL Take 500 mg by mouth every 6 (six) hours as needed for mild pain.   finasteride 5 MG tablet Commonly known as:  PROSCAR Take 1 tablet (5 mg total) by mouth daily.   gabapentin 100 MG capsule Commonly known as:  NEURONTIN Take 1 capsule (100 mg total) by mouth 3 (three) times daily.   multivitamin tablet Take 1 tablet by mouth daily.   tamsulosin 0.4 MG Caps capsule Commonly known as:  FLOMAX Take 1 capsule (0.4 mg total) by mouth daily.       Allergies: No Known Allergies  Family History: Family History  Problem Relation Age of Onset  . Hypertension Maternal Grandmother   . Prostate cancer Neg Hx   .  Kidney cancer Neg Hx   . Bladder Cancer Neg Hx     Social History:  reports that he has been smoking Cigarettes.  He has never used smokeless tobacco. He reports that he drinks alcohol. He reports that he does not use drugs.  ROS: UROLOGY Frequent Urination?: No Hard to postpone urination?: No Burning/pain with urination?: Yes Get up at night to urinate?: Yes Leakage of urine?: No Urine stream starts and stops?: No Trouble starting stream?: No Do you have to strain to urinate?: No Blood in urine?: No Urinary tract infection?: No Sexually transmitted disease?: No Injury to kidneys or bladder?: No Painful intercourse?: No Weak stream?: No Erection problems?: No Penile pain?: No  Gastrointestinal Nausea?: Yes Vomiting?: No Indigestion/heartburn?: Yes Diarrhea?: No Constipation?: No  Constitutional Fever: No Night sweats?: No Weight loss?: Yes Fatigue?: No  Skin Skin rash/lesions?: Yes (y) Itching?: No  Eyes Blurred vision?: No Double vision?: No  Ears/Nose/Throat Sore throat?: No Sinus problems?: No  Hematologic/Lymphatic Swollen glands?: No Easy bruising?: No  Cardiovascular Leg swelling?: No Chest pain?: No  Respiratory Cough?: No Shortness of breath?: No  Endocrine Excessive thirst?: No  Musculoskeletal Back pain?: No Joint pain?: No  Neurological Headaches?: No Dizziness?: Yes  Psychologic Depression?: No Anxiety?: No  Physical Exam: BP 121/80   Pulse 76   Ht 6' (1.829 m)   Wt 175 lb 11.2 oz (79.7 kg)   BMI 23.83 kg/m   Constitutional: Well nourished. Alert and oriented, No acute distress. HEENT: [place] AT, moist mucus membranes. Trachea midline, no masses. Cardiovascular: No clubbing, cyanosis, or edema. Respiratory: Normal respiratory effort, no increased work of breathing. GI: Abdomen is soft, non tender, non distended, no abdominal masses. Liver and spleen not palpable.  No hernias appreciated.  Stool sample for occult testing  is not indicated.   GU: No CVA tenderness.  No bladder fullness or masses.  Patient with circumcised phallus. Urethral meatus is patent.  No penile discharge. No penile lesions or rashes. Scrotum without lesions, cysts, rashes and/or edema.  Testicles are located scrotally bilaterally. No masses are appreciated in the testicles. Left and right epididymis are normal. Rectal: Patient with  normal sphincter tone. Anus and perineum without scarring  or rashes. No rectal masses are appreciated. Prostate is approximately 55 grams, no nodules are appreciated. Seminal vesicles are normal. Skin: No rashes, bruises or suspicious lesions. Lymph: No cervical or inguinal adenopathy. Neurologic: Grossly intact, no focal deficits, moving all 4 extremities. Psychiatric: Normal mood and affect.  Laboratory Data: Lab Results  Component Value Date   WBC 4.7 12/20/2016   HGB 15.6 12/20/2016   HCT 47.4 12/20/2016   MCV 85.4 12/20/2016   PLT 299 12/20/2016    Lab Results  Component Value Date   CREATININE 1.26 12/20/2016    Lab Results  Component Value Date   PSA 0.7 11/05/2016    Lab Results  Component Value Date   HGBA1C 5.4 11/07/2016    Lab Results  Component Value Date   TSH 0.56 11/05/2016       Component Value Date/Time   CHOL 161 11/05/2016 1314   CHOL 163 09/20/2015 1037   HDL 69 11/05/2016 1314   HDL 66 09/20/2015 1037   CHOLHDL 2.3 11/05/2016 1314   VLDL 8 11/05/2016 1314   LDLCALC 84 11/05/2016 1314   LDLCALC 91 09/20/2015 1037    Lab Results  Component Value Date   AST 16 12/20/2016   Lab Results  Component Value Date   ALT 14 12/20/2016     Urinalysis Unremarkable.  See EPIC.    Pertinent Imaging: CLINICAL DATA:  Left scrotal pain x2 months  EXAM: SCROTAL ULTRASOUND  DOPPLER ULTRASOUND OF THE TESTICLES  TECHNIQUE: Complete ultrasound examination of the testicles, epididymis, and other scrotal structures was performed. Color and spectral  Doppler ultrasound were also utilized to evaluate blood flow to the testicles.  COMPARISON:  None.  FINDINGS: Right testicle  Measurements: 5.1 x 2.1 x 2.9 cm. No mass or microlithiasis visualized.  Left testicle  Measurements: 5.2 x 2.1 x 3.2 cm. No mass or microlithiasis visualized.  Right epididymis:  Normal in size and appearance.  Left epididymis:  Normal in size and appearance.  Hydrocele:  Small right hydrocele.  Varicocele:  None visualized.  Pulsed Doppler interrogation of both testes demonstrates normal low resistance arterial and venous waveforms bilaterally.  Additional comments: Small lymph nodes in the right inguinal canal, measuring 4 mm short axis, likely reactive.  IMPRESSION: Normal sonographic appearance of the bilateral testes.  Small right hydrocele.  No evidence of testicular torsion.   Electronically Signed   By: Charline BillsSriyesh  Krishnan M.D.   On: 07/02/2016 11:43  CLINICAL DATA:  46 year old male status post bilateral inguinal hernia repair with concern for persistent discomfort in both groins, left upper quadrant and mid back.  EXAM: MRI PELVIS WITHOUT AND WITH CONTRAST  TECHNIQUE: Multiplanar multisequence MR imaging of the pelvis was performed both before and after administration of intravenous contrast.  CONTRAST:  15mL MULTIHANCE GADOBENATE DIMEGLUMINE 529 MG/ML IV SOLN  COMPARISON:  None.  FINDINGS: Urinary Tract: No abnormality visualized. The urinary bladder is physiologically distended without intraluminal mass or calculus. In penile urethra is unremarkable.  Bowel:  Unremarkable visualized pelvic bowel loops.  Vascular/Lymphatic: No pathologically enlarged lymph nodes. No significant vascular abnormality seen.  Reproductive: Normal size prostate. Seminal vesicles, corpora cavernosa, corpus spongiosum and intrascrotal contents are unremarkable.  Other:  No apparent herniation into the inguinal  canals.  Musculoskeletal: No bone marrow edema, fracture or bone destruction. Hip joints demonstrate no joint effusion. There is mild degenerative joint space narrowing of both hips with slight bony protuberance superolaterally off the right femoral head neck junction. No focal chondral defect.  No definite labral tear. The sacroiliac joints, sacral ala, pubic symphysis and rami are intact. The iliac bones are maintained bilaterally. The L4-5 and L5-S1 discs are nonacute.  IMPRESSION: No findings for the patient's bilateral groin pain. No recurrent inguinal hernia is identified.   Electronically Signed   By: Tollie Eth M.D.   On: 01/07/2017 19:43   Assessment & Plan:    1. BPH with LUTS  - IPSS score is 3/3  - Continue conservative management, avoiding bladder irritants and timed voiding's  - most bothersome symptoms is/are nocturia and intermittent dysuria  - Initiate alpha-blocker (tamsulosin 0.4 mg), discussed side effects   - Initiate 5 alpha reductase inhibitor (finasteride 5 mg), discussed side effects - check PSA today  - RTC in 3 months for I PSS and PVR   2. Dysuria  - may be related to obstructive symptoms  - will screen for STI's   - will reassess in 3 months  3. Nocturia  - may be related to obstruction  - will reassess in 3 months  4. Left lower suprapubic pain  - unsure if this has an urological etiology  - patient will increase water intake  - will reassess in 3 months   Return in about 3 months (around 07/09/2017) for IPSS and PVR.  These notes generated with voice recognition software. I apologize for typographical errors.  Michiel Cowboy, PA-C  [facility] [address], [address] [place], [place] [address] [phone]

## 2017-04-08 ENCOUNTER — Encounter: Payer: Self-pay | Admitting: Urology

## 2017-04-08 ENCOUNTER — Ambulatory Visit (INDEPENDENT_AMBULATORY_CARE_PROVIDER_SITE_OTHER): Payer: Commercial Managed Care - HMO | Admitting: Urology

## 2017-04-08 VITALS — BP 121/80 | HR 76 | Ht 72.0 in | Wt 175.7 lb

## 2017-04-08 DIAGNOSIS — R102 Pelvic and perineal pain: Secondary | ICD-10-CM | POA: Diagnosis not present

## 2017-04-08 DIAGNOSIS — N138 Other obstructive and reflux uropathy: Secondary | ICD-10-CM | POA: Diagnosis not present

## 2017-04-08 DIAGNOSIS — N401 Enlarged prostate with lower urinary tract symptoms: Secondary | ICD-10-CM

## 2017-04-08 DIAGNOSIS — R300 Dysuria: Secondary | ICD-10-CM | POA: Diagnosis not present

## 2017-04-08 DIAGNOSIS — R351 Nocturia: Secondary | ICD-10-CM

## 2017-04-08 LAB — URINALYSIS, COMPLETE
Bilirubin, UA: NEGATIVE
Glucose, UA: NEGATIVE
Ketones, UA: NEGATIVE
Leukocytes, UA: NEGATIVE
Nitrite, UA: NEGATIVE
Protein, UA: NEGATIVE
RBC, UA: NEGATIVE
Specific Gravity, UA: 1.005 (ref 1.005–1.030)
Urobilinogen, Ur: 0.2 mg/dL (ref 0.2–1.0)
pH, UA: 5.5 (ref 5.0–7.5)

## 2017-04-08 LAB — MICROSCOPIC EXAMINATION
RBC, UA: NONE SEEN /hpf (ref 0–?)
Renal Epithel, UA: NONE SEEN /hpf
WBC, UA: NONE SEEN /hpf (ref 0–?)

## 2017-04-08 LAB — BLADDER SCAN AMB NON-IMAGING: Scan Result: 98

## 2017-04-08 MED ORDER — TAMSULOSIN HCL 0.4 MG PO CAPS
0.4000 mg | ORAL_CAPSULE | Freq: Every day | ORAL | 3 refills | Status: DC
Start: 2017-04-08 — End: 2017-11-10

## 2017-04-08 MED ORDER — FINASTERIDE 5 MG PO TABS: 5.0000 mg | ORAL_TABLET | Freq: Every day | ORAL | 3 refills | Status: DC

## 2017-04-08 NOTE — Addendum Note (Signed)
Addended by: Mervin KungWILLIAMS, Edwin Cherian K on: 04/08/2017 03:59 PM   Modules accepted: Orders

## 2017-04-09 LAB — RPR: RPR Ser Ql: NONREACTIVE

## 2017-04-09 LAB — HSV 1 AND 2 IGM ABS, INDIRECT
HSV 1 IgM: <1:10 {titer}
HSV 2 IgM: <1:10 {titer}

## 2017-04-09 LAB — PSA: Prostate Specific Ag, Serum: 0.8 ng/mL (ref 0.0–4.0)

## 2017-04-10 LAB — GC/CHLAMYDIA PROBE AMP
Chlamydia trachomatis, NAA: NEGATIVE
Neisseria gonorrhoeae by PCR: NEGATIVE

## 2017-07-09 NOTE — Progress Notes (Signed)
07/10/2017 4:56 PM   Joseph Strong 1971/07/29 [id number]  Referring provider: Ellyn Hack, MD [address] [place], [place] [address]  Chief Complaint  Patient presents with  . Nocturia  . Benign Prostatic Hypertrophy    HPI: 46 yo AAM with a history of dysuria, chronic groin pain, BPH with LU TS and nocturia who presents today for a three month follow up.  Background history Patient was referred by Dr. [name] for dysuria and chronic groin pain.   He has been having pain in his left suprapubic region for greater than one year.  8-9/10 pain.  "Pain that feels numb at the same time, it's hard to describe."   Pain comes and goes.  It can stay for one hour and a half.  Drinking fluids makes it worse.  Multi vitamin makes the pain better.   He states the pain does not cause nausea or vomiting.  He states the pain moves around his abdomen.  He states he sometime has cramping pain.  He admits to having constipation.  His UA was unremarkable.   He states he is sexually active.  He complains of a rash on his arm that occurs intermittently.    BPH with LUTS His IPSS score today is 5, which is mild lower urinary tract symptomatology. He is mostly satisfied with his quality life due to his urinary symptoms.  His PVR is 11 mL.   His previous I PSS score was 3/3.  His previous PVR was 98 mL.  His major complaints today are dysuria off and on and nocturia.x 2  He has had these symptoms for the last two months.  He denies any dysuria, hematuria or suprapubic pain.   He also denies any recent fevers, chills, nausea or vomiting.  He is currently taking tamsulosin 0.4 mg and finasteride 5 mg daily.   He is experiencing a slight retrograde ejaculation.   He does not have a family history of PCa.      IPSS    Row Name 07/10/17 1600         International Prostate Symptom Score   How often have you had the sensation of not emptying your bladder? Less than 1 in 5     How  often have you had to urinate less than every two hours? Less than 1 in 5 times     How often have you found you stopped and started again several times when you urinated? Less than 1 in 5 times     How often have you found it difficult to postpone urination? Not at All     How often have you had a weak urinary stream? Not at All     How often have you had to strain to start urination? Not at All     How many times did you typically get up at night to urinate? 2 Times     Total IPSS Score 5       Quality of Life due to urinary symptoms   If you were to spend the rest of your life with your urinary condition just the way it is now how would you feel about that? Mostly Satisfied        Score:  1-7 Mild 8-19 Moderate 20-35 Severe  Patient's HSV, RPR, urine culture and GC/Chlamydia were negative. Patient's creatinine has been slowly rising over the last year.  He is also complaining of a metallic taste.  PMH: Past Medical History:  Diagnosis Date  . Burning with urination 01-10-16  . Constipation   . Dysrhythmia    "OCCASSIONAL SPEEDING UP OF HEART RATE" PT THINKING IT MAY BE DUE TO HERNIA  . Headache    MIGRAINES  . Inguinal hernia    left     Surgical History: Past Surgical History:  Procedure Laterality Date  . HERNIA REPAIR Left 1998   AND UMBILICAL  . HERNIA REPAIR     46 years of age  . INGUINAL HERNIA REPAIR Right 01/11/2016   Procedure: HERNIA REPAIR INGUINAL ADULT;  Surgeon: Kieth Brightly, MD;  Location: [facility] ORS;  Service: General;  Laterality: Right;  . TONSILLECTOMY     childhood    Home Medications:  Allergies as of 07/10/2017   No Known Allergies     Medication List       Accurate as of 07/10/17  4:56 PM. Always use your most recent med list.          acetaminophen 500 MG tablet Commonly known as:  TYLENOL Take 500 mg by mouth every 6 (six) hours as needed for mild pain.   finasteride 5 MG tablet Commonly known as:  PROSCAR Take 1  tablet (5 mg total) by mouth daily.   gabapentin 100 MG capsule Commonly known as:  NEURONTIN Take 1 capsule (100 mg total) by mouth 3 (three) times daily.   multivitamin tablet Take 1 tablet by mouth daily.   tamsulosin 0.4 MG Caps capsule Commonly known as:  FLOMAX Take 1 capsule (0.4 mg total) by mouth daily.       Allergies: No Known Allergies  Family History: Family History  Problem Relation Age of Onset  . Hypertension Maternal Grandmother   . Prostate cancer Neg Hx   . Kidney cancer Neg Hx   . Bladder Cancer Neg Hx     Social History:  reports that he has been smoking Cigarettes.  He has never used smokeless tobacco. He reports that he drinks alcohol. He reports that he does not use drugs.  ROS: UROLOGY Frequent Urination?: No Hard to postpone urination?: No Burning/pain with urination?: Yes Get up at night to urinate?: Yes Leakage of urine?: No Urine stream starts and stops?: No Trouble starting stream?: No Do you have to strain to urinate?: No Blood in urine?: No Urinary tract infection?: No Sexually transmitted disease?: No Injury to kidneys or bladder?: No Painful intercourse?: No Weak stream?: No Erection problems?: No Penile pain?: No  Gastrointestinal Nausea?: No Vomiting?: No Indigestion/heartburn?: No Diarrhea?: No Constipation?: Yes  Constitutional Fever: No Night sweats?: No Weight loss?: No Fatigue?: No  Skin Skin rash/lesions?: No Itching?: No  Eyes Blurred vision?: No Double vision?: No  Ears/Nose/Throat Sore throat?: No Sinus problems?: No  Hematologic/Lymphatic Swollen glands?: No Easy bruising?: No  Cardiovascular Leg swelling?: No Chest pain?: No  Respiratory Cough?: No Shortness of breath?: No  Endocrine Excessive thirst?: No  Musculoskeletal Back pain?: No Joint pain?: No  Neurological Headaches?: Yes Dizziness?: Yes  Psychologic Depression?: No Anxiety?: No  Physical Exam: BP 108/67 (BP  Location: Left Arm, Patient Position: Sitting, Cuff Size: Normal)   Pulse 83   Ht 6' (1.829 m)   Wt 175 lb 6.4 oz (79.6 kg)   BMI 23.79 kg/m   Constitutional: Well nourished. Alert and oriented, No acute distress. HEENT: [place] AT, moist mucus membranes. Trachea midline, no masses. Cardiovascular: No clubbing, cyanosis, or edema. Respiratory: Normal respiratory effort, no increased work of  breathing. Skin: No rashes, bruises or suspicious lesions. Lymph: No cervical or inguinal adenopathy. Neurologic: Grossly intact, no focal deficits, moving all 4 extremities. Psychiatric: Normal mood and affect.  Laboratory Data: Lab Results  Component Value Date   WBC 4.7 12/20/2016   HGB 15.6 12/20/2016   HCT 47.4 12/20/2016   MCV 85.4 12/20/2016   PLT 299 12/20/2016    Lab Results  Component Value Date   CREATININE 1.26 12/20/2016    Lab Results  Component Value Date   PSA 0.7 11/05/2016        PSA                       0.8                                                                   04/08/2017  Lab Results  Component Value Date   HGBA1C 5.4 11/07/2016    Lab Results  Component Value Date   TSH 0.56 11/05/2016       Component Value Date/Time   CHOL 161 11/05/2016 1314   CHOL 163 09/20/2015 1037   HDL 69 11/05/2016 1314   HDL 66 09/20/2015 1037   CHOLHDL 2.3 11/05/2016 1314   VLDL 8 11/05/2016 1314   LDLCALC 84 11/05/2016 1314   LDLCALC 91 09/20/2015 1037    Lab Results  Component Value Date   AST 16 12/20/2016   Lab Results  Component Value Date   ALT 14 12/20/2016   I have reviewed the labs  Pertinent Imaging: Results for OZELL, STROJNY (MRN [id number]) as of 07/10/2017 17:01  Ref. Range 07/10/2017 16:27  Scan Result Unknown 11    Assessment & Plan:    1. BPH with LUTS  - IPSS score is 5/2, it stable  - Continue conservative management, avoiding bladder irritants and timed voiding's - encouraged patient to increase his water intake  - most bothersome  symptoms is/are nocturia and intermittent dysuria   - continue tamsulosin 0.4 mg and finasteride 5 mg daily  - RTC in 3 months for I PSS and PVR   2. Dysuria  - no STI's  - encouraged patient to increase his water intake and decrease his caffeine intake  - will reassess in 3 months   3. Nocturia  - improved  4. Left lower suprapubic pain  - unsure if this has an urological etiology  - patient will increase water intake  - will refer to PT - patient needs to speak with his employer prior to referral  5. Rising creatinine  - recheck CMP today as his creatinine seems to be slowly rising  Return in about 4 months (around 11/09/2017) for IPSS, PSA and exam.  These notes generated with voice recognition software. I apologize for typographical errors.  Michiel Cowboy, PA-C  [facility] [address] [place], [place] [address] [phone]

## 2017-07-10 ENCOUNTER — Ambulatory Visit (INDEPENDENT_AMBULATORY_CARE_PROVIDER_SITE_OTHER): Payer: 59 | Admitting: Urology

## 2017-07-10 ENCOUNTER — Encounter: Payer: Self-pay | Admitting: Urology

## 2017-07-10 VITALS — BP 108/67 | HR 83 | Ht 72.0 in | Wt 175.4 lb

## 2017-07-10 DIAGNOSIS — R102 Pelvic and perineal pain: Secondary | ICD-10-CM

## 2017-07-10 DIAGNOSIS — R351 Nocturia: Secondary | ICD-10-CM

## 2017-07-10 DIAGNOSIS — N401 Enlarged prostate with lower urinary tract symptoms: Secondary | ICD-10-CM

## 2017-07-10 DIAGNOSIS — R300 Dysuria: Secondary | ICD-10-CM | POA: Diagnosis not present

## 2017-07-10 DIAGNOSIS — R7989 Other specified abnormal findings of blood chemistry: Secondary | ICD-10-CM | POA: Diagnosis not present

## 2017-07-10 DIAGNOSIS — N138 Other obstructive and reflux uropathy: Secondary | ICD-10-CM

## 2017-07-10 LAB — BLADDER SCAN AMB NON-IMAGING: Scan Result: 11

## 2017-07-11 ENCOUNTER — Telehealth: Payer: Self-pay

## 2017-07-11 LAB — COMPREHENSIVE METABOLIC PANEL
ALT: 14 IU/L (ref 0–44)
AST: 17 IU/L (ref 0–40)
Albumin/Globulin Ratio: 1.5 (ref 1.2–2.2)
Albumin: 4.2 g/dL (ref 3.5–5.5)
Alkaline Phosphatase: 60 IU/L (ref 39–117)
BUN/Creatinine Ratio: 16 (ref 9–20)
BUN: 18 mg/dL (ref 6–24)
Bilirubin Total: <0.2 mg/dL (ref 0.0–1.2)
CO2: 17 mmol/L — ABNORMAL LOW (ref 20–29)
Calcium: 9.1 mg/dL (ref 8.7–10.2)
Chloride: 106 mmol/L (ref 96–106)
Creatinine, Ser: 1.1 mg/dL (ref 0.76–1.27)
GFR calc Af Amer: 93 mL/min/{1.73_m2} (ref >59)
GFR calc non Af Amer: 81 mL/min/{1.73_m2} (ref >59)
Globulin, Total: 2.8 g/dL (ref 1.5–4.5)
Glucose: 81 mg/dL (ref 65–99)
Potassium: 4.3 mmol/L (ref 3.5–5.2)
Sodium: 139 mmol/L (ref 134–144)
Total Protein: 7 g/dL (ref 6.0–8.5)

## 2017-07-11 NOTE — Telephone Encounter (Signed)
LMOM- labs are normal. 

## 2017-07-11 NOTE — Telephone Encounter (Signed)
-----   Message from Harle Battiest, PA-C sent at 07/11/2017  8:09 AM EDT ----- Please tell Mr. Clemmer his labs were normal.

## 2017-07-17 ENCOUNTER — Ambulatory Visit
Admission: EM | Admit: 2017-07-17 | Discharge: 2017-07-17 | Disposition: A | Discharge status: Home / Self Care | Payer: Worker's Compensation | Attending: Family Medicine | Admitting: Family Medicine

## 2017-07-17 DIAGNOSIS — Z0283 Encounter for blood-alcohol and blood-drug test: Secondary | ICD-10-CM

## 2017-07-17 DIAGNOSIS — W2209XA Striking against other stationary object, initial encounter: Secondary | ICD-10-CM

## 2017-07-17 DIAGNOSIS — Z23 Encounter for immunization: Secondary | ICD-10-CM

## 2017-07-17 DIAGNOSIS — S0101XA Laceration without foreign body of scalp, initial encounter: Secondary | ICD-10-CM | POA: Diagnosis not present

## 2017-07-17 MED ORDER — TETANUS-DIPHTH-ACELL PERTUSSIS 5-2.5-18.5 LF-MCG/0.5 IM SUSP
0.5000 mL | Freq: Once | INTRAMUSCULAR | Status: AC
Start: 2017-07-17 — End: 2017-07-17
  Administered 2017-07-17: 0.5 mL via INTRAMUSCULAR

## 2017-07-17 MED ORDER — MUPIROCIN 2 % EX OINT: 1.0000 "application " | TOPICAL_OINTMENT | Freq: Three times a day (TID) | CUTANEOUS | 0 refills | Status: DC

## 2017-07-17 NOTE — ED Notes (Signed)
UDS obtained in clinic.

## 2017-07-17 NOTE — ED Triage Notes (Signed)
Patient complains a laceration to the top of his head. Patient states that this morning around 732am he was washing his hands and a napkin fell out of a holder on the wall. Patient states that he leaned back up and hit head on a plastic holder. Patient thought he just bumped his head but he hit the corner and it cut his head. Patient states that area was bleeding. Work placed gauze and tape on it and sent him here.

## 2017-07-17 NOTE — ED Provider Notes (Signed)
[facility]    CSN: [id number] Arrival date & time: 07/17/17  0808     History   Chief Complaint Chief Complaint  Patient presents with  . Head Laceration    HPI Sylvan Grimard is a 46 y.o. male.   HPI This a 46 year old male who at work today was washing his hands ,the paper towel dropped from a holder on the wall. He bent over to pick it up and when he stood back up,he struck  His head on the plastic holder corner. He susutained a laceration to the scalp. He is not current on his tetanus toxoid. He had no loss of consciousness.       Past Medical History:  Diagnosis Date  . Burning with urination 01-10-16  . Constipation   . Dysrhythmia    "OCCASSIONAL SPEEDING UP OF HEART RATE" PT THINKING IT MAY BE DUE TO HERNIA  . Headache    MIGRAINES  . Inguinal hernia    left     Patient Active Problem List   Diagnosis Date Noted  . Abdominal pain, periumbilical 12/20/2016  . Decreased frequency of bowel movements 01/10/2016  . Burning with urination 01/10/2016  . Left paraspinal back pain 12/08/2015  . Dizziness, nonspecific 10/31/2015  . Abnormal TSH 10/31/2015  . Chronic pain of left groin 10/31/2015  . Abnormal EKG 10/31/2015  . Annual physical exam 09/20/2015  . Testicular pain, left 09/20/2015  . Unilateral inguinal hernia without obstruction or gangrene 09/20/2015  . S/P unilateral inguinal hernia repair 09/20/2015  . Bacteria in urine 09/06/2015    Past Surgical History:  Procedure Laterality Date  . HERNIA REPAIR Left 1998   AND UMBILICAL  . HERNIA REPAIR     46 years of age  . INGUINAL HERNIA REPAIR Right 01/11/2016   Procedure: HERNIA REPAIR INGUINAL ADULT;  Surgeon: Kieth Brightly, MD;  Location: [facility] ORS;  Service: General;  Laterality: Right;  . TONSILLECTOMY     childhood       Home Medications    Prior to Admission medications   Medication Sig Start Date End Date Taking? Authorizing Provider  acetaminophen (TYLENOL) 500  MG tablet Take 500 mg by mouth every 6 (six) hours as needed for mild pain.   Yes [provider]  finasteride (PROSCAR) 5 MG tablet Take 1 tablet (5 mg total) by mouth daily. 04/08/17  Yes McGowan, Carollee Herter A, PA-C  Multiple Vitamin (MULTIVITAMIN) tablet Take 1 tablet by mouth daily.   Yes [provider]  tamsulosin (FLOMAX) 0.4 MG CAPS capsule Take 1 capsule (0.4 mg total) by mouth daily. 04/08/17  Yes McGowan, Carollee Herter A, PA-C  gabapentin (NEURONTIN) 100 MG capsule Take 1 capsule (100 mg total) by mouth 3 (three) times daily. Patient not taking: Reported on 04/08/2017 09/30/16   Ellyn Hack, MD  mupirocin ointment (BACTROBAN) 2 % Apply 1 application topically 3 (three) times daily. 07/17/17   Lutricia Feil, PA-C    Family History Family History  Problem Relation Age of Onset  . Hypertension Maternal Grandmother   . Prostate cancer Neg Hx   . Kidney cancer Neg Hx   . Bladder Cancer Neg Hx     Social History Social History  Substance Use Topics  . Smoking status: Current Some Day Smoker    Types: Cigarettes  . Smokeless tobacco: Never Used  . Alcohol use 0.0 oz/week     Comment: occasional     Allergies   Patient has no known  allergies.   Review of Systems Review of Systems  Constitutional: Positive for activity change. Negative for chills, fatigue and fever.  Skin: Positive for wound.  All other systems reviewed and are negative.    Physical Exam Triage Vital Signs ED Triage Vitals  Enc Vitals Group     BP 07/17/17 0835 114/70     Pulse Rate 07/17/17 0835 74     Resp 07/17/17 0835 18     Temp 07/17/17 0835 97.7 F (36.5 C)     Temp Source 07/17/17 0835 Oral     SpO2 07/17/17 0835 98 %     Weight 07/17/17 0832 175 lb (79.4 kg)     Height 07/17/17 0832 6' (1.829 m)     Head Circumference --      Peak Flow --      Pain Score 07/17/17 0832 7     Pain Loc --      Pain Edu? --      Excl. in GC? --    No data found.   Updated Vital  Signs BP 114/70 (BP Location: Left Arm)   Pulse 74   Temp 97.7 F (36.5 C) (Oral)   Resp 18   Ht 6' (1.829 m)   Wt 175 lb (79.4 kg)   SpO2 98%   BMI 23.73 kg/m   Visual Acuity Right Eye Distance:   Left Eye Distance:   Bilateral Distance:    Right Eye Near:   Left Eye Near:    Bilateral Near:     Physical Exam  Constitutional: He is oriented to person, place, and time. He appears well-developed and well-nourished. No distress.  HENT:  Head: Normocephalic.  Eyes: Pupils are equal, round, and reactive to light. EOM are normal. Right eye exhibits no discharge. Left eye exhibits no discharge.  Neck: Normal range of motion. Neck supple.  Musculoskeletal: Normal range of motion.  Neurological: He is alert and oriented to person, place, and time.  Skin: Skin is warm and dry. He is not diaphoretic.  Psychiatric: He has a normal mood and affect. His behavior is normal. Thought content normal.  Nursing note and vitals reviewed.    UC Treatments / Results  Labs (all labs ordered are listed, but only abnormal results are displayed) Labs Reviewed - No data to display  EKG  EKG Interpretation None       Radiology No results found.  Procedures .Marland KitchenLaceration Repair Date/Time: 07/17/2017 9:11 AM Performed by: Lutricia Feil Authorized by: Hassan Rowan   Consent:    Consent obtained:  Verbal   Consent given by:  Patient   Risks discussed:  Infection and poor cosmetic result Anesthesia (see MAR for exact dosages):    Anesthesia method:  Local infiltration   Local anesthetic:  Lidocaine 1% w/o epi Laceration details:    Location:  Scalp   Scalp location:  Mid-scalp   Length (cm):  1.5   Depth (mm):  2 Repair type:    Repair type:  Simple Pre-procedure details:    Preparation:  Patient was prepped and draped in usual sterile fashion Exploration:    Hemostasis achieved with:  Direct pressure   Wound exploration: entire depth of wound probed and visualized      Wound extent: areolar tissue violated     Contaminated: no   Treatment:    Area cleansed with:  Shur-Clens   Amount of cleaning:  Standard   Irrigation solution:  Sterile saline   Irrigation volume:  30  Irrigation method:  Syringe   Visualized foreign bodies/material removed: no   Skin repair:    Repair method:  Sutures   Suture size:  4-0   Suture material:  Nylon   Number of sutures:  2 Approximation:    Approximation:  Close   Vermilion border: well-aligned   Post-procedure details:    Dressing:  Sterile dressing   Patient tolerance of procedure:  Tolerated well, no immediate complications Comments:     Keep dry for 24 hours. Wash daily and apply Bactroban UGT 3 times daily. Sutures out 6 days.   (including critical care time)  Medications Ordered in UC Medications  Tdap (BOOSTRIX) injection 0.5 mL (0.5 mLs Intramuscular Given 07/17/17 0912)     Initial Impression / Assessment and Plan / UC Course  I have reviewed the triage vital signs and the nursing notes.  Pertinent labs & imaging results that were available during my care of the patient were reviewed by me and considered in my medical decision making (see chart for details).     Plan: 1. Test/x-ray results and diagnosis reviewed with patient 2. rx as per orders; risks, benefits, potential side effects reviewed with patient 3. Recommend supportive treatment with keep dry for 24 hours. Afterwards wash 3 times daily. Apply Bactroban to the wound 3 times daily. Will plan suture removal in 6 days. If any signs or symptoms of infection occur he should return to clinic immediately. 4. F/u prn if symptoms worsen or don't improve   Final Clinical Impressions(s) / UC Diagnoses   Final diagnoses:  Laceration of scalp, initial encounter    New Prescriptions New Prescriptions   MUPIROCIN OINTMENT (BACTROBAN) 2 %    Apply 1 application topically 3 (three) times daily.     Controlled Substance Prescriptions [place]  Controlled Substance Registry consulted? Not Applicable   Lutricia Feil, PA-C 07/17/17 1610

## 2017-07-23 ENCOUNTER — Encounter: Payer: Self-pay | Admitting: Emergency Medicine

## 2017-07-23 ENCOUNTER — Ambulatory Visit
Admission: EM | Admit: 2017-07-23 | Discharge: 2017-07-23 | Disposition: A | Discharge status: Home / Self Care | Payer: Worker's Compensation | Attending: Emergency Medicine | Admitting: Emergency Medicine

## 2017-07-23 DIAGNOSIS — S0101XD Laceration without foreign body of scalp, subsequent encounter: Secondary | ICD-10-CM

## 2017-07-23 DIAGNOSIS — Z4802 Encounter for removal of sutures: Secondary | ICD-10-CM

## 2017-07-23 NOTE — ED Triage Notes (Signed)
Patient here for suture removal to head scalp.

## 2017-07-23 NOTE — ED Provider Notes (Signed)
HPI  SUBJECTIVE:  Joseph Strong is a 46 y.o. male who presents for suture removal. Patient was seen here 6 days ago ago for a scalp laceration. 2 sutures placed. Patient reports that the area is sensitive but has no complaints. He has been cleaning it with soap and water and using antibiotic ointment on it. No swelling, redness, drainage, pain.  Past Medical History:  Diagnosis Date  . Burning with urination 01-10-16  . Constipation   . Dysrhythmia    "OCCASSIONAL SPEEDING UP OF HEART RATE" PT THINKING IT MAY BE DUE TO HERNIA  . Headache    MIGRAINES  . Inguinal hernia    left     Past Surgical History:  Procedure Laterality Date  . HERNIA REPAIR Left 1998   AND UMBILICAL  . HERNIA REPAIR     46 years of age  . INGUINAL HERNIA REPAIR Right 01/11/2016   Procedure: HERNIA REPAIR INGUINAL ADULT;  Surgeon: Kieth BrightlySeeplaputhur G Sankar, MD;  Location: [facility] ORS;  Service: General;  Laterality: Right;  . TONSILLECTOMY     childhood    Family History  Problem Relation Age of Onset  . Hypertension Maternal Grandmother   . Prostate cancer Neg Hx   . Kidney cancer Neg Hx   . Bladder Cancer Neg Hx     Social History  Substance Use Topics  . Smoking status: Current Some Day Smoker    Types: Cigarettes  . Smokeless tobacco: Never Used  . Alcohol use 0.0 oz/week     Comment: occasional    No current facility-administered medications for this encounter.   Current Outpatient Prescriptions:  .  acetaminophen (TYLENOL) 500 MG tablet, Take 500 mg by mouth every 6 (six) hours as needed for mild pain., Disp: , Rfl:  .  finasteride (PROSCAR) 5 MG tablet, Take 1 tablet (5 mg total) by mouth daily., Disp: 90 tablet, Rfl: 3 .  gabapentin (NEURONTIN) 100 MG capsule, Take 1 capsule (100 mg total) by mouth 3 (three) times daily. (Patient not taking: Reported on 04/08/2017), Disp: 90 capsule, Rfl: 1 .  Multiple Vitamin (MULTIVITAMIN) tablet, Take 1 tablet by mouth daily., Disp: , Rfl:  .  mupirocin  ointment (BACTROBAN) 2 %, Apply 1 application topically 3 (three) times daily., Disp: 22 g, Rfl: 0 .  tamsulosin (FLOMAX) 0.4 MG CAPS capsule, Take 1 capsule (0.4 mg total) by mouth daily., Disp: 90 capsule, Rfl: 3  No Known Allergies   ROS  As noted in HPI.   Physical Exam  Ht 6' (1.829 m)   Wt 175 lb (79.4 kg)   BMI 23.73 kg/m   Constitutional: Well developed, well nourished, no acute distress Eyes:  EOMI, conjunctiva normal bilaterally HENT: Normocephalic, atraumatic,mucus membranes moist. Well healed laceration of scalp. Sutures completely removed. Mild tenderness. No dehiscence, erythema, expressible purulent drainage, bleeding. Respiratory: Normal inspiratory effort Cardiovascular: Normal rate GI: nondistended skin: No rash, skin intact Musculoskeletal: no deformities Neurologic: Alert & oriented x 3, no focal neuro deficits Psychiatric: Speech and behavior appropriate   ED Course   Medications - No data to display  No orders of the defined types were placed in this encounter.   No results found for this or any previous visit (from the past 24 hour(s)). No results found.  ED Clinical Impression  Visit for suture removal  Laceration of scalp, subsequent encounter   ED Assessment/Plan Sutures were removed by RN. Patient tolerated procedure well. Follow-up with PMD as needed. Continue antibacterial ointment for the next several  days. Filled out worker's comp paperwork. .   No orders of the defined types were placed in this encounter.   *This clinic note was created using Dragon dictation software. Therefore, there may be occasional mistakes despite careful proofreading.  ?   Domenick Gong, MD 07/23/17 1224

## 2017-08-19 NOTE — Progress Notes (Signed)
08/20/2017 3:49 PM   Joseph Strong 08-Aug-1971 [id number]  Referring provider: Ellyn Hack, MD [address] [place], [place] [address]  Chief Complaint  Patient presents with  . Abdominal Pain    HPI: 46 yo AAM with a history of dysuria, chronic groin pain, BPH with LU TS and nocturia who presents today for an urgent appointment for persistent pain in his groin.    Background history Patient was referred by Dr. [name] for dysuria and chronic groin pain.   He has been having pain in his left suprapubic region for greater than one year.  8-9/10 pain.  "Pain that feels numb at the same time, it's hard to describe."   Pain comes and goes.  It can stay for one hour and a half.  Drinking fluids makes it worse.  Multi vitamin makes the pain better.   He states the pain does not cause nausea or vomiting.  He states the pain moves around his abdomen.  He states he sometime has cramping pain.  He admits to having constipation.  His UA was unremarkable.   He states he is sexually active.  He complains of a rash on his arm that occurs intermittently.  RPR was negative.  HSV is negative.  GC/Chlamydia cultures negative.  Urine culture negative.    BPH with LUTS His IPSS score today is 7, which is mild lower urinary tract symptomatology. He is delighted with his quality life due to his urinary symptoms.  His PVR is 81 mL.   His previous I PSS score was 5/2.  His previous PVR was 11 mL.  His major complaints today are still dysuria off and on and nocturia.x 2   He has had these symptoms for the last two months.  He denies any dysuria, hematuria or suprapubic pain.   He also denies any recent fevers, chills, nausea or vomiting.  He is currently taking tamsulosin 0.4 mg and finasteride 5 mg daily.   He is experiencing a slight retrograde ejaculation.  He does not have a family history of PCa.      IPSS    Row Name 07/10/17 1600 08/20/17 1500       International Prostate  Symptom Score   How often have you had the sensation of not emptying your bladder? Less than 1 in 5 Not at All    How often have you had to urinate less than every two hours? Less than 1 in 5 times Less than 1 in 5 times    How often have you found you stopped and started again several times when you urinated? Less than 1 in 5 times Less than half the time    How often have you found it difficult to postpone urination? Not at All Not at All    How often have you had a weak urinary stream? Not at All Less than 1 in 5 times    How often have you had to strain to start urination? Not at All Not at All    How many times did you typically get up at night to urinate? 2 Times 3 Times    Total IPSS Score 5 7      Quality of Life due to urinary symptoms   If you were to spend the rest of your life with your urinary condition just the way it is now how would you feel about that? Mostly Satisfied Delighted  Score:  1-7 Mild 8-19 Moderate 20-35 Severe  He is also complaining of a metallic taste in his mouth.  He states the left groin pain has not diminished.  He states it radiates to his left waist.  His UA today is unremarkable.    PMH: Past Medical History:  Diagnosis Date  . Burning with urination 01-10-16  . Constipation   . Dysrhythmia    "OCCASSIONAL SPEEDING UP OF HEART RATE" PT THINKING IT MAY BE DUE TO HERNIA  . Headache    MIGRAINES  . Inguinal hernia    left     Surgical History: Past Surgical History:  Procedure Laterality Date  . HERNIA REPAIR Left 1998   AND UMBILICAL  . HERNIA REPAIR     46 years of age  . INGUINAL HERNIA REPAIR Right 01/11/2016   Procedure: HERNIA REPAIR INGUINAL ADULT;  Surgeon: Kieth Brightly, MD;  Location: [facility] ORS;  Service: General;  Laterality: Right;  . TONSILLECTOMY     childhood    Home Medications:  Allergies as of 08/20/2017   No Known Allergies     Medication List       Accurate as of 08/20/17  3:49 PM. Always use your  most recent med list.          acetaminophen 500 MG tablet Commonly known as:  TYLENOL Take 500 mg by mouth every 6 (six) hours as needed for mild pain.   finasteride 5 MG tablet Commonly known as:  PROSCAR Take 1 tablet (5 mg total) by mouth daily.   gabapentin 100 MG capsule Commonly known as:  NEURONTIN Take 1 capsule (100 mg total) by mouth 3 (three) times daily.   multivitamin tablet Take 1 tablet by mouth daily.   mupirocin ointment 2 % Commonly known as:  BACTROBAN Apply 1 application topically 3 (three) times daily.   tamsulosin 0.4 MG Caps capsule Commonly known as:  FLOMAX Take 1 capsule (0.4 mg total) by mouth daily.            Discharge Care Instructions        Start     Ordered   08/20/17 0000  Urinalysis, Complete     08/20/17 1510   08/20/17 0000  CULTURE, URINE COMPREHENSIVE     08/20/17 1510   08/20/17 0000  GC/Chlamydia Probe Amp     08/20/17 1510   08/20/17 0000  Bladder Scan (Post Void Residual) in office     08/20/17 1510   08/20/17 0000  CT RENAL STONE STUDY    Question Answer Comment  Preferred imaging location? [facility]   Radiology Contrast Protocol - do NOT remove file path [email or web address]      08/20/17 1549      Allergies: No Known Allergies  Family History: Family History  Problem Relation Age of Onset  . Hypertension Maternal Grandmother   . Prostate cancer Neg Hx   . Kidney cancer Neg Hx   . Bladder Cancer Neg Hx     Social History:  reports that he has been smoking Cigarettes.  He has never used smokeless tobacco. He reports that he drinks alcohol. He reports that he does not use drugs.  ROS: UROLOGY Frequent Urination?: No Hard to postpone urination?: No Burning/pain with urination?: Yes Get up at night to urinate?: Yes Leakage of urine?: No Urine stream starts and stops?: No Trouble starting stream?: No Do you have to strain to urinate?: No Blood in urine?: No Urinary  tract infection?: No  Sexually transmitted disease?: No Injury to kidneys or bladder?: No Painful intercourse?: No Weak stream?: No Erection problems?: No Penile pain?: No  Gastrointestinal Nausea?: Yes Vomiting?: No Indigestion/heartburn?: Yes Diarrhea?: No Constipation?: No  Constitutional Fever: No Night sweats?: No Weight loss?: No Fatigue?: No  Skin Skin rash/lesions?: Yes Itching?: No  Eyes Blurred vision?: No Double vision?: No  Ears/Nose/Throat Sore throat?: No Sinus problems?: No  Hematologic/Lymphatic Swollen glands?: No Easy bruising?: No  Cardiovascular Leg swelling?: No Chest pain?: No  Respiratory Cough?: No Shortness of breath?: No  Endocrine Excessive thirst?: No  Musculoskeletal Back pain?: Yes Joint pain?: No  Neurological Headaches?: Yes Dizziness?: Yes  Psychologic Depression?: No Anxiety?: No  Physical Exam: BP 114/74 (BP Location: Right Arm, Patient Position: Sitting, Cuff Size: Normal)   Pulse 80   Ht 6' (1.829 m)   Wt 176 lb (79.8 kg)   BMI 23.87 kg/m   Constitutional: Well nourished. Alert and oriented, No acute distress. HEENT: [place] AT, moist mucus membranes. Trachea midline, no masses. Cardiovascular: No clubbing, cyanosis, or edema. Respiratory: Normal respiratory effort, no increased work of breathing. Skin: No rashes, bruises or suspicious lesions. Lymph: No cervical or inguinal adenopathy. Neurologic: Grossly intact, no focal deficits, moving all 4 extremities. Psychiatric: Normal mood and affect.  Laboratory Data: Lab Results  Component Value Date   WBC 4.7 12/20/2016   HGB 15.6 12/20/2016   HCT 47.4 12/20/2016   MCV 85.4 12/20/2016   PLT 299 12/20/2016    Lab Results  Component Value Date   CREATININE 1.10 07/10/2017    Lab Results  Component Value Date   PSA 0.7 11/05/2016        PSA                       0.8                                                                   04/08/2017  Lab  Results  Component Value Date   HGBA1C 5.4 11/07/2016    Lab Results  Component Value Date   TSH 0.56 11/05/2016       Component Value Date/Time   CHOL 161 11/05/2016 1314   CHOL 163 09/20/2015 1037   HDL 69 11/05/2016 1314   HDL 66 09/20/2015 1037   CHOLHDL 2.3 11/05/2016 1314   VLDL 8 11/05/2016 1314   LDLCALC 84 11/05/2016 1314   LDLCALC 91 09/20/2015 1037    Lab Results  Component Value Date   AST 17 07/10/2017   Lab Results  Component Value Date   ALT 14 07/10/2017    Urinalysis Unremarkable.  See EPIC.   I have reviewed the labs  Pertinent Imaging: Results for Joseph Strong, Joseph Strong (MRN [id number]) as of 08/31/2017 15:51  Ref. Range 08/20/2017 15:29  Scan Result Unknown 81     Assessment & Plan:    1. BPH with LUTS  - IPSS score is 7/0, it stable  - Continue conservative management, avoiding bladder irritants and timed voiding's - encouraged patient to increase his water intake  - most bothersome symptoms is/are nocturia and intermittent dysuria   - continue tamsulosin 0.4 mg and finasteride 5 mg daily  - RTC in 3 months for I PSS  and PVR   2. Dysuria  - no STI's  - encouraged patient to increase his water intake and decrease his caffeine intake  - CT Renal stone study to r/o stone   3. Nocturia  - improved  4. Left lower suprapubic pain  - unsure if this has an urological etiology  - patient will increase water intake  - obtain a CT Renal stone study to r/o stone  5. Left flank pain  - see above   Return for CT renal stone report.  These notes generated with voice recognition software. I apologize for typographical errors.  Michiel Cowboy, PA-C  [facility] [address], [address] [place], [place] [address] [phone]

## 2017-08-20 ENCOUNTER — Encounter: Payer: Self-pay | Admitting: Urology

## 2017-08-20 ENCOUNTER — Ambulatory Visit (INDEPENDENT_AMBULATORY_CARE_PROVIDER_SITE_OTHER): Payer: 59 | Admitting: Urology

## 2017-08-20 VITALS — BP 114/74 | HR 80 | Ht 72.0 in | Wt 176.0 lb

## 2017-08-20 DIAGNOSIS — N401 Enlarged prostate with lower urinary tract symptoms: Secondary | ICD-10-CM | POA: Diagnosis not present

## 2017-08-20 DIAGNOSIS — R102 Pelvic and perineal pain: Secondary | ICD-10-CM | POA: Diagnosis not present

## 2017-08-20 DIAGNOSIS — R109 Unspecified abdominal pain: Secondary | ICD-10-CM

## 2017-08-20 DIAGNOSIS — N138 Other obstructive and reflux uropathy: Secondary | ICD-10-CM | POA: Diagnosis not present

## 2017-08-20 DIAGNOSIS — R351 Nocturia: Secondary | ICD-10-CM

## 2017-08-20 DIAGNOSIS — R300 Dysuria: Secondary | ICD-10-CM | POA: Diagnosis not present

## 2017-08-20 LAB — URINALYSIS, COMPLETE
Bilirubin, UA: NEGATIVE
Glucose, UA: NEGATIVE
Ketones, UA: NEGATIVE
Leukocytes, UA: NEGATIVE
Nitrite, UA: NEGATIVE
Protein, UA: NEGATIVE
RBC, UA: NEGATIVE
Specific Gravity, UA: <1.005 — ABNORMAL LOW (ref 1.005–1.030)
Urobilinogen, Ur: 0.2 mg/dL (ref 0.2–1.0)
pH, UA: 6.5 (ref 5.0–7.5)

## 2017-08-20 LAB — BLADDER SCAN AMB NON-IMAGING: Scan Result: 81

## 2017-08-21 LAB — GC/CHLAMYDIA PROBE AMP
Chlamydia trachomatis, NAA: NEGATIVE
Neisseria gonorrhoeae by PCR: NEGATIVE

## 2017-08-24 LAB — CULTURE, URINE COMPREHENSIVE

## 2017-09-03 ENCOUNTER — Ambulatory Visit
Admission: RE | Admit: 2017-09-03 | Discharge: 2017-09-03 | Disposition: A | Discharge status: Home / Self Care | Payer: Commercial Managed Care - HMO | Source: Ambulatory Visit | Attending: Urology | Admitting: Urology

## 2017-09-03 DIAGNOSIS — R109 Unspecified abdominal pain: Secondary | ICD-10-CM | POA: Diagnosis not present

## 2017-09-03 DIAGNOSIS — M4186 Other forms of scoliosis, lumbar region: Secondary | ICD-10-CM | POA: Insufficient documentation

## 2017-09-03 DIAGNOSIS — K5641 Fecal impaction: Secondary | ICD-10-CM | POA: Insufficient documentation

## 2017-09-03 DIAGNOSIS — K432 Incisional hernia without obstruction or gangrene: Secondary | ICD-10-CM | POA: Insufficient documentation

## 2017-09-03 DIAGNOSIS — M4184 Other forms of scoliosis, thoracic region: Secondary | ICD-10-CM | POA: Insufficient documentation

## 2017-09-07 DIAGNOSIS — Z23 Encounter for immunization: Secondary | ICD-10-CM | POA: Diagnosis not present

## 2017-09-09 NOTE — Progress Notes (Signed)
09/10/2017 4:48 PM   Joseph Strong 1971/03/31 [id number]  Referring provider: Ellyn Hack, MD [address] [place], [place] [address]  Chief Complaint  Patient presents with  . Results    CT    HPI: 46 yo AAM with a history of dysuria, chronic groin pain, BPH with LU TS and nocturia who presents today to review his CT renal stone study results.  Background history Patient was referred by Dr. [name] for dysuria and chronic groin pain.   He has been having pain in his left suprapubic region for greater than one year.  8-9/10 pain.  "Pain that feels numb at the same time, it's hard to describe."   Pain comes and goes.  It can stay for one hour and a half.  Drinking fluids makes it worse.  Multi vitamin makes the pain better.   He states the pain does not cause nausea or vomiting.  He states the pain moves around his abdomen.  He states he sometime has cramping pain.  He admits to having constipation.  His UA was unremarkable.   He states he is sexually active.  He complains of a rash on his arm that occurs intermittently.  RPR was negative.  HSV is negative.  GC/Chlamydia cultures negative.  Urine culture negative.    BPH with LUTS His IPSS score is 7, which is mild lower urinary tract symptomatology. He is delighted with his quality life due to his urinary symptoms.  His PVR is 81 mL.   His previous I PSS score was 5/2.  His previous PVR was 11 mL.    His major complaints today are still dysuria off and on and nocturia.x 2   He has had these symptoms for the last two months.  He denies any  hematuria or suprapubic pain.   He also denies any recent fevers, chills, nausea or vomiting.  He is currently taking tamsulosin 0.4 mg and finasteride 5 mg daily.   He is experiencing a slight retrograde ejaculation.  He does not have a family history of PCa.  UA, urine culture and CG/chlamydia were negative from 08/20/2017.    CT Renal stone study performed on 09/03/2017  noted a small recurrent ventral hernia at the site of prior repair, with a small (less than 4 cubic cm) volume of herniated omental adipose tissue.  Findings in both inguinal regions suggesting prior inguinal hernia repairs.   Dextroconvex thoracic scoliosis and mild levoconvex lumbar scoliosis.  Prominent stool throughout the colon favors constipation.  A specific cause for the patient's left lower abdominal/groin pain and flank pain is not identified.      IPSS    Row Name 08/20/17 1500         International Prostate Symptom Score   How often have you had the sensation of not emptying your bladder? Not at All     How often have you had to urinate less than every two hours? Less than 1 in 5 times     How often have you found you stopped and started again several times when you urinated? Less than half the time     How often have you found it difficult to postpone urination? Not at All     How often have you had a weak urinary stream? Less than 1 in 5 times     How often have you had to strain to start urination? Not at All     How  many times did you typically get up at night to urinate? 3 Times     Total IPSS Score 7       Quality of Life due to urinary symptoms   If you were to spend the rest of your life with your urinary condition just the way it is now how would you feel about that? Delighted        Score:  1-7 Mild 8-19 Moderate 20-35 Severe  PMH: Past Medical History:  Diagnosis Date  . Burning with urination 01-10-16  . Constipation   . Dysrhythmia    "OCCASSIONAL SPEEDING UP OF HEART RATE" PT THINKING IT MAY BE DUE TO HERNIA  . Headache    MIGRAINES  . Inguinal hernia    left     Surgical History: Past Surgical History:  Procedure Laterality Date  . HERNIA REPAIR Left 1998   AND UMBILICAL  . HERNIA REPAIR     46 years of age  . INGUINAL HERNIA REPAIR Right 01/11/2016   Procedure: HERNIA REPAIR INGUINAL ADULT;  Surgeon: Kieth Brightly, MD;  Location:  [facility] ORS;  Service: General;  Laterality: Right;  . TONSILLECTOMY     childhood    Home Medications:  Allergies as of 09/10/2017   No Known Allergies     Medication List       Accurate as of 09/10/17  4:48 PM. Always use your most recent med list.          acetaminophen 500 MG tablet Commonly known as:  TYLENOL Take 500 mg by mouth every 6 (six) hours as needed for mild pain.   finasteride 5 MG tablet Commonly known as:  PROSCAR Take 1 tablet (5 mg total) by mouth daily.   gabapentin 100 MG capsule Commonly known as:  NEURONTIN Take 1 capsule (100 mg total) by mouth 3 (three) times daily.   multivitamin tablet Take 1 tablet by mouth daily.   MULTI-VITAMINS Tabs Take by mouth.   mupirocin ointment 2 % Commonly known as:  BACTROBAN Apply 1 application topically 3 (three) times daily.   tamsulosin 0.4 MG Caps capsule Commonly known as:  FLOMAX Take 1 capsule (0.4 mg total) by mouth daily.       Allergies: No Known Allergies  Family History: Family History  Problem Relation Age of Onset  . Hypertension Maternal Grandmother   . Prostate cancer Neg Hx   . Kidney cancer Neg Hx   . Bladder Cancer Neg Hx     Social History:  reports that he has been smoking Cigarettes.  He has never used smokeless tobacco. He reports that he drinks alcohol. He reports that he does not use drugs.  ROS: UROLOGY Frequent Urination?: No Hard to postpone urination?: No Burning/pain with urination?: Yes Get up at night to urinate?: Yes Leakage of urine?: No Urine stream starts and stops?: No Trouble starting stream?: No Do you have to strain to urinate?: No Blood in urine?: No Urinary tract infection?: No Sexually transmitted disease?: No Injury to kidneys or bladder?: No Painful intercourse?: No Weak stream?: No Erection problems?: No Penile pain?: No  Gastrointestinal Nausea?: Yes Vomiting?: No Indigestion/heartburn?: Yes Diarrhea?: No Constipation?:  No  Constitutional Fever: No Night sweats?: No Weight loss?: No Fatigue?: Yes  Skin Skin rash/lesions?: No Itching?: No  Eyes Blurred vision?: No Double vision?: No  Ears/Nose/Throat Sore throat?: No Sinus problems?: No  Hematologic/Lymphatic Swollen glands?: No Easy bruising?: No  Cardiovascular Leg swelling?: No Chest pain?: No  Respiratory  Cough?: No Shortness of breath?: No  Endocrine Excessive thirst?: No  Musculoskeletal Back pain?: No Joint pain?: No  Neurological Headaches?: No Dizziness?: Yes  Psychologic Depression?: No Anxiety?: No  Physical Exam: BP 124/84   Pulse 72   Ht 6' (1.829 m)   Wt 178 lb 9.6 oz (81 kg)   BMI 24.22 kg/m   Constitutional: Well nourished. Alert and oriented, No acute distress. HEENT: [place] AT, moist mucus membranes. Trachea midline, no masses. Cardiovascular: No clubbing, cyanosis, or edema. Respiratory: Normal respiratory effort, no increased work of breathing. Skin: No rashes, bruises or suspicious lesions. Lymph: No cervical or inguinal adenopathy. Neurologic: Grossly intact, no focal deficits, moving all 4 extremities. Psychiatric: Normal mood and affect.  Laboratory Data: Lab Results  Component Value Date   WBC 4.7 12/20/2016   HGB 15.6 12/20/2016   HCT 47.4 12/20/2016   MCV 85.4 12/20/2016   PLT 299 12/20/2016    Lab Results  Component Value Date   CREATININE 1.10 07/10/2017    Lab Results  Component Value Date   PSA 0.7 11/05/2016        PSA                       0.8                                                                   04/08/2017  Lab Results  Component Value Date   HGBA1C 5.4 11/07/2016    Lab Results  Component Value Date   TSH 0.56 11/05/2016       Component Value Date/Time   CHOL 161 11/05/2016 1314   CHOL 163 09/20/2015 1037   HDL 69 11/05/2016 1314   HDL 66 09/20/2015 1037   CHOLHDL 2.3 11/05/2016 1314   VLDL 8 11/05/2016 1314   LDLCALC 84 11/05/2016 1314    LDLCALC 91 09/20/2015 1037    Lab Results  Component Value Date   AST 17 07/10/2017   Lab Results  Component Value Date   ALT 14 07/10/2017    Urinalysis    Component Value Date/Time   COLORURINE YELLOW 03/18/2017 1615   APPEARANCEUR Clear 08/20/2017 1512   LABSPEC 1.002 03/18/2017 1615   LABSPEC 1.006 09/30/2014 1629   PHURINE 6.0 03/18/2017 1615   GLUCOSEU Negative 08/20/2017 1512   GLUCOSEU Negative 09/30/2014 1629   HGBUR NEGATIVE 03/18/2017 1615   BILIRUBINUR Negative 08/20/2017 1512   BILIRUBINUR Negative 09/30/2014 1629   KETONESUR NEGATIVE 03/18/2017 1615   PROTEINUR Negative 08/20/2017 1512   PROTEINUR NEGATIVE 03/18/2017 1615   NITRITE Negative 08/20/2017 1512   NITRITE NEGATIVE 03/18/2017 1615   LEUKOCYTESUR Negative 08/20/2017 1512   LEUKOCYTESUR Negative 09/30/2014 1629   I have reviewed the labs  Pertinent Imaging: CLINICAL DATA:  Left lower abdominal/groin pain. Frequent urination. Left flank pain. Prior hernia repair with mesh.  EXAM: CT ABDOMEN AND PELVIS WITHOUT CONTRAST  TECHNIQUE: Multidetector CT imaging of the abdomen and pelvis was performed following the standard protocol without IV contrast.  COMPARISON:  Multiple exams, including MRI abdomen from 01/09/2017 and CT abdomen from 10/03/2015  FINDINGS: Lower chest: Asymmetric lower thoracic contour likely attributable to scoliosis.  Hepatobiliary: Mildly contracted gallbladder. Otherwise unremarkable.  Pancreas: Unremarkable  Spleen:  Unremarkable  Adrenals/Urinary Tract: No urinary tract calculi identified. Bilateral extrarenal pelvis. Urinary bladder normal. Adrenal glands unremarkable.  Stomach/Bowel: Prominent stool throughout the colon favors constipation.  Vascular/Lymphatic: Unremarkable  Reproductive: Unremarkable  Other: No supplemental non-categorized findings.  Musculoskeletal: Characteristic findings for bilateral inguinal hernia repairs. No  specific complicating feature.  Suture material observed along the upper abdominal midline likely from a prior ventral hernia repair. Just above this there appears to be a small recurrent ventral hernia with approximately 2.6 by 1.0 by 2.6 cm (volume = 3.5 cm^3) focus of herniated omental adipose tissue. No herniated bowel.  Levoconvex lumbar scoliosis.  No impingement.  IMPRESSION: 1. Small recurrent ventral hernia at the site of prior repair, with a small (less than 4 cubic cm) volume of herniated omental adipose tissue. 2. Findings in both inguinal regions suggesting prior inguinal hernia repairs. 3. Dextroconvex thoracic scoliosis and mild levoconvex lumbar scoliosis. 4.  Prominent stool throughout the colon favors constipation. 5. A specific cause for the patient's left lower abdominal/groin pain and flank pain is not identified .   Electronically Signed   By: Gaylyn Rong M.D.   On: 09/04/2017 08:39    I have independently reviewed the films    Assessment & Plan:    1. BPH with LUTS  - IPSS score is 7/0, it stable  - Continue conservative management, avoiding bladder irritants and timed voiding's - encouraged patient to increase his water intake  - most bothersome symptoms is/are nocturia and intermittent dysuria   - continue tamsulosin 0.4 mg and finasteride 5 mg daily   - RTC for cystoscopy  2. Dysuria  - no STI's, urine culture negative.  CT renal stone study did not demonstrate an etiology for his dysuria  - encouraged patient to increase his water intake and decrease his caffeine intake - did not improve dysuria  - tamsulosin and finasteride did not improve dysuria  - RTC for cystoscopy for further evaluation of dysuria   3. Nocturia  - improved  4. Left lower suprapubic pain  - see above  Return for cystoscopy for dysuria.  These notes generated with voice recognition software. I apologize for typographical errors.  Michiel Cowboy,  PA-C  [facility] [address], [address] [place], [place] [address] [phone]

## 2017-09-10 ENCOUNTER — Ambulatory Visit (INDEPENDENT_AMBULATORY_CARE_PROVIDER_SITE_OTHER): Payer: 59 | Admitting: Urology

## 2017-09-10 ENCOUNTER — Encounter: Payer: Self-pay | Admitting: Urology

## 2017-09-10 VITALS — BP 124/84 | HR 72 | Ht 72.0 in | Wt 178.6 lb

## 2017-09-10 DIAGNOSIS — N401 Enlarged prostate with lower urinary tract symptoms: Secondary | ICD-10-CM | POA: Diagnosis not present

## 2017-09-10 DIAGNOSIS — R102 Pelvic and perineal pain: Secondary | ICD-10-CM | POA: Diagnosis not present

## 2017-09-10 DIAGNOSIS — R1024 Suprapubic pain: Secondary | ICD-10-CM

## 2017-09-10 DIAGNOSIS — R351 Nocturia: Secondary | ICD-10-CM | POA: Diagnosis not present

## 2017-09-10 DIAGNOSIS — N138 Other obstructive and reflux uropathy: Secondary | ICD-10-CM

## 2017-09-16 ENCOUNTER — Ambulatory Visit (INDEPENDENT_AMBULATORY_CARE_PROVIDER_SITE_OTHER): Payer: 59 | Admitting: Urology

## 2017-09-16 ENCOUNTER — Encounter: Payer: Self-pay | Admitting: Urology

## 2017-09-16 VITALS — BP 127/82 | HR 90 | Ht 72.0 in | Wt 173.1 lb

## 2017-09-16 DIAGNOSIS — R300 Dysuria: Secondary | ICD-10-CM

## 2017-09-16 DIAGNOSIS — R1032 Left lower quadrant pain: Secondary | ICD-10-CM | POA: Diagnosis not present

## 2017-09-16 DIAGNOSIS — G8929 Other chronic pain: Secondary | ICD-10-CM | POA: Diagnosis not present

## 2017-09-16 DIAGNOSIS — Z125 Encounter for screening for malignant neoplasm of prostate: Secondary | ICD-10-CM | POA: Diagnosis not present

## 2017-09-16 MED ORDER — CIPROFLOXACIN HCL 500 MG PO TABS
500.0000 mg | ORAL_TABLET | Freq: Once | ORAL | Status: AC
  Administered 2017-09-16: 500 mg via ORAL

## 2017-09-16 MED ORDER — LIDOCAINE HCL 2 % EX GEL
1.0000 "application " | Freq: Once | CUTANEOUS | Status: AC
  Administered 2017-09-16: 1 via URETHRAL

## 2017-09-16 NOTE — Progress Notes (Signed)
09/16/2017 10:22 AM   Joseph Strong Oct 15, 1971 [id number]  Referring provider: Ellyn Hack, MD [address] [place], [place] [address]  CC:   HPI:  1. Chronic Groin Pain - long h/o wax and wane groin pain more left than right. Had open bilateral inguinal hernia repairs in 1998. No hernias / masses on exam and CT 2018. He has h/o chronic pain, migrianes and anxiety.   2. Dysuria - long h/o irritative voiding. PVR 11mL 2018 (normal), UA normal x many, GC negative. Cysto 08/2017 normal (no masses / stricture). On tamsulosin + finasteride with significant improvement.   3. Prostate cancer screening -    2018 PSA 0.8  Today "[name]" is seen for cysto to maximally r/o worrisome causes of UA negative dysuria. He is doing much better on dual medical therapy.     PMH: Past Medical History:  Diagnosis Date  . Burning with urination 01-10-16  . Constipation   . Dysrhythmia    "OCCASSIONAL SPEEDING UP OF HEART RATE" PT THINKING IT MAY BE DUE TO HERNIA  . Headache    MIGRAINES  . Inguinal hernia    left     Surgical History: Past Surgical History:  Procedure Laterality Date  . HERNIA REPAIR Left 1998   AND UMBILICAL  . HERNIA REPAIR     46 years of age  . INGUINAL HERNIA REPAIR Right 01/11/2016   Procedure: HERNIA REPAIR INGUINAL ADULT;  Surgeon: Kieth Brightly, MD;  Location: [facility] ORS;  Service: General;  Laterality: Right;  . TONSILLECTOMY     childhood    Home Medications:  Allergies as of 09/16/2017   No Known Allergies     Medication List       Accurate as of 09/16/17 10:22 AM. Always use your most recent med list.          acetaminophen 500 MG tablet Commonly known as:  TYLENOL Take 500 mg by mouth every 6 (six) hours as needed for mild pain.   finasteride 5 MG tablet Commonly known as:  PROSCAR Take 1 tablet (5 mg total) by mouth daily.   gabapentin 100 MG capsule Commonly known as:  NEURONTIN Take 1 capsule (100 mg total) by  mouth 3 (three) times daily.   multivitamin tablet Take 1 tablet by mouth daily.   MULTI-VITAMINS Tabs Take by mouth.   mupirocin ointment 2 % Commonly known as:  BACTROBAN Apply 1 application topically 3 (three) times daily.   tamsulosin 0.4 MG Caps capsule Commonly known as:  FLOMAX Take 1 capsule (0.4 mg total) by mouth daily.       Allergies: No Known Allergies  Family History: Family History  Problem Relation Age of Onset  . Hypertension Maternal Grandmother   . Prostate cancer Neg Hx   . Kidney cancer Neg Hx   . Bladder Cancer Neg Hx     Social History:  reports that he has been smoking Cigarettes.  He has never used smokeless tobacco. He reports that he drinks alcohol. He reports that he does not use drugs.    Review of Systems  Gastrointestinal (upper)  : Negative for upper GI symptoms  Gastrointestinal (lower) : Negative for lower GI symptoms  Constitutional : Negative for symptoms  Skin: Negative for skin symptoms  Eyes: Negative for eye symptoms  Ear/Nose/Throat : Negative for Ear/Nose/Throat symptoms  Hematologic/Lymphatic: Negative for Hematologic/Lymphatic symptoms  Cardiovascular : Negative for cardiovascular symptoms  Respiratory : Negative for respiratory symptoms  Endocrine:  Negative for endocrine symptoms  Musculoskeletal: Negative for musculoskeletal symptoms  Neurological: Negative for neurological symptoms  Psychologic: Negative for psychiatric symptoms   Physical Exam: There were no vitals taken for this visit.  Constitutional:  Alert and oriented, No acute distress. HEENT: [place] AT, moist mucus membranes.  Trachea midline, no masses. Cardiovascular: No clubbing, cyanosis, or edema. Respiratory: Normal respiratory effort, no increased work of breathing. GI: Abdomen is soft, nontender, nondistended, no abdominal masses GU: No CVA tenderness. Testes descendd, no scrotal masses / inguinal hernias. Phallus straight.    Skin: No rashes, bruises or suspicious lesions. Lymph: No cervical or inguinal adenopathy. Neurologic: Grossly intact, no focal deficits, moving all 4 extremities. Psychiatric: Normal mood and affect.  Laboratory Data: Lab Results  Component Value Date   WBC 4.7 12/20/2016   HGB 15.6 12/20/2016   HCT 47.4 12/20/2016   MCV 85.4 12/20/2016   PLT 299 12/20/2016    Lab Results  Component Value Date   CREATININE 1.10 07/10/2017    Lab Results  Component Value Date   PSA1 0.8 04/08/2017   PSA1 0.8 09/20/2015    No results found for: TESTOSTERONE  Lab Results  Component Value Date   HGBA1C 5.4 11/07/2016    Urinalysis Lab Results  Component Value Date   SPECGRAV <1.005 (L) 08/20/2017   PHUR 6.5 08/20/2017   COLORU Yellow 08/20/2017   APPEARANCEUR Clear 08/20/2017   LEUKOCYTESUR Negative 08/20/2017   PROTEINUR Negative 08/20/2017   GLUCOSEU Negative 08/20/2017   KETONESU Negative 08/20/2017   RBCU Negative 08/20/2017   BILIRUBINUR Negative 08/20/2017   UUROB 0.2 08/20/2017   NITRITE Negative 08/20/2017    Lab Results  Component Value Date   LABMICR See below: 04/08/2017   WBCUA None seen 04/08/2017   RBCUA None seen 04/08/2017   LABEPIT 0-10 04/08/2017   BACTERIA NONE SEEN 07/02/2016    Pertinent Imaging: * No results found for this or any previous visit. Results for orders placed during the hospital encounter of 09/03/17  CT RENAL STONE STUDY   Narrative CLINICAL DATA:  Left lower abdominal/groin pain. Frequent urination. Left flank pain. Prior hernia repair with mesh.  EXAM: CT ABDOMEN AND PELVIS WITHOUT CONTRAST  TECHNIQUE: Multidetector CT imaging of the abdomen and pelvis was performed following the standard protocol without IV contrast.  COMPARISON:  Multiple exams, including MRI abdomen from 01/09/2017 and CT abdomen from 10/03/2015  FINDINGS: Lower chest: Asymmetric lower thoracic contour likely attributable to  scoliosis.  Hepatobiliary: Mildly contracted gallbladder. Otherwise unremarkable.  Pancreas: Unremarkable  Spleen: Unremarkable  Adrenals/Urinary Tract: No urinary tract calculi identified. Bilateral extrarenal pelvis. Urinary bladder normal. Adrenal glands unremarkable.  Stomach/Bowel: Prominent stool throughout the colon favors constipation.  Vascular/Lymphatic: Unremarkable  Reproductive: Unremarkable  Other: No supplemental non-categorized findings.  Musculoskeletal: Characteristic findings for bilateral inguinal hernia repairs. No specific complicating feature.  Suture material observed along the upper abdominal midline likely from a prior ventral hernia repair. Just above this there appears to be a small recurrent ventral hernia with approximately 2.6 by 1.0 by 2.6 cm (volume = 3.5 cm^3) focus of herniated omental adipose tissue. No herniated bowel.  Levoconvex lumbar scoliosis.  No impingement.  IMPRESSION: 1. Small recurrent ventral hernia at the site of prior repair, with a small (less than 4 cubic cm) volume of herniated omental adipose tissue. 2. Findings in both inguinal regions suggesting prior inguinal hernia repairs. 3. Dextroconvex thoracic scoliosis and mild levoconvex lumbar scoliosis. 4.  Prominent stool throughout the colon favors constipation.  5. A specific cause for the patient's left lower abdominal/groin pain and flank pain is not identified .   Electronically Signed   By: Gaylyn Rong M.D.   On: 09/04/2017 08:39      09/16/17  CC: No chief complaint on file.   HPI:  There were no vitals taken for this visit. NED. A&Ox3.   No respiratory distress   Abd soft, NT, ND Normal phallus with bilateral descended testicles  Cystoscopy Procedure Note  Patient identification was confirmed, informed consent was obtained, and patient was prepped using Betadine solution.  Lidocaine jelly was administered per urethral meatus.     Preoperative abx where received prior to procedure.     Pre-Procedure: - Inspection reveals a normal caliber ureteral meatus.  Procedure: The flexible cystoscope was introduced without difficulty - No urethral strictures/lesions are present. - mild bilobar hypertophy prostate  - Normal bladder neck - Bilateral ureteral orifices identified - Bladder mucosa  reveals no ulcers, tumors, or lesions - No bladder stones - No trabeculation  Retroflexion shows no additional findings.    Post-Procedure: - Patient tolerated the procedure well  Assessment/ Plan:  Assessment & Plan:    1. Chronic Groin Pain - likely genitofemoral nerve irritation from piror hernia repair. No evidence of retropritoneal, scrotal pathology or hernia recurrence. His bother is not terrible. He is reassured and will use PRN OTC meds.   2. Dysuria - no infection, neoplasia, or obstruction. Again suspect this is manifestation of anxiety spectrum problem and some early BPH symptoms. Rec PRN urinary analgesics such as AZO and continue tamsulosin + finasteride as he is improved.    3. Prostate cancer screening - up to date this year, continue annually until age 63 or so as average risk.   RTC as scheduled.   Sebastian Ache, MD  [facility] [address], [address] [place], [place] [address] [phone]

## 2017-09-17 LAB — URINALYSIS, COMPLETE
Bilirubin, UA: NEGATIVE
Glucose, UA: NEGATIVE
Ketones, UA: NEGATIVE
Leukocytes, UA: NEGATIVE
Nitrite, UA: NEGATIVE
Protein, UA: NEGATIVE
RBC, UA: NEGATIVE
Specific Gravity, UA: <1.005 — ABNORMAL LOW (ref 1.005–1.030)
Urobilinogen, Ur: 0.2 mg/dL (ref 0.2–1.0)
pH, UA: 5.5 (ref 5.0–7.5)

## 2017-11-07 NOTE — Progress Notes (Signed)
11/10/2017 4:54 PM   Joseph Strong 11/20/1971 [id number]  Referring provider: Ellyn HackShah, Syed Asad A, MD [address] [place], [place] [id number]  Chief Complaint  Patient presents with  . Benign Prostatic Hypertrophy    HPI: 46 yo AAM with BPH with LU TS, chronic groin pain, nocturia and dysuria who presents for a 4 months follow up.  Background history Patient is a 46 year old African American male who is referred by Dr. Velta AddisonSyed Asad Shah for dysuria and chronic groin pain.  He has been having pain in his left suprapubic region for greater than one year.  8-9/10 pain.  "Pain that feels numb at the same time, it's hard to describe."   Pain comes and goes.  It can stay for one hour and a half.  Drinking fluids makes it worse.  Multi vitamin makes the pain better.   He states the pain does not cause nausea or vomiting.  He states the pain moves around his abdomen.  He states he sometime has cramping pain.  He admits to having constipation.  His UA was unremarkable.   He states he is sexually active.  He complains of a rash on his arm that occurs intermittently.     CT Renal stone study performed on 09/03/2017 noted small recurrent ventral hernia at the site of prior repair, with a small (less than 4 cubic cm) volume of herniated omental adipose tissue.  Findings in both inguinal regions suggesting prior inguinal hernia repairs. Dextroconvex thoracic scoliosis and mild levoconvex lumbar scoliosis.  Prominent stool throughout the colon favors constipation.  A specific cause for the patient's left lower abdominal/groin pain and flank pain is not identified.    Cystoscopy performed on 09/16/2017 with Dr. Berneice HeinrichManny was negative.    His IPSS score today is 5, which is mild lower urinary tract symptomatology.  He is mostly satisfied with his quality life due to his urinary symptoms.  His PVR is 4 mL.   His previous I PSS score 3/3.  His previous PVR was 98 mL.  He is currently on tamsulosin 0.4  mg and finasteride 5 mg daily.  He denies any dysuria, hematuria or suprapubic pain.   He also denies any recent fevers, chills, nausea or vomiting. He does not have a family history of PCa.  He states he is still having the discomfort in his left suprapubic region, dysuria and intermittency.  He is also having a rash that is appearing intermittently on the left side of his body, on his arms and legs.    He also states he is suffering from constipation and has seen blood on the toilet paper when he wipes after a hard BM.    IPSS    Row Name 11/10/17 1600         International Prostate Symptom Score   How often have you had the sensation of not emptying your bladder?  Not at All     How often have you had to urinate less than every two hours?  Less than half the time     How often have you found you stopped and started again several times when you urinated?  Less than 1 in 5 times     How often have you found it difficult to postpone urination?  Not at All     How often have you had a weak urinary stream?  Less than 1 in 5 times     How often have you  had to strain to start urination?  Not at All     How many times did you typically get up at night to urinate?  1 Time     Total IPSS Score  5       Quality of Life due to urinary symptoms   If you were to spend the rest of your life with your urinary condition just the way it is now how would you feel about that?  Mostly Satisfied        Score:  1-7 Mild 8-19 Moderate 20-35 Severe  PMH: Past Medical History:  Diagnosis Date  . Burning with urination 01-10-16  . Constipation   . Dysrhythmia    "OCCASSIONAL SPEEDING UP OF HEART RATE" PT THINKING IT MAY BE DUE TO HERNIA  . Headache    MIGRAINES  . Inguinal hernia    left     Surgical History: Past Surgical History:  Procedure Laterality Date  . HERNIA REPAIR Left 1998   AND UMBILICAL  . HERNIA REPAIR     46 years of age  . INGUINAL HERNIA REPAIR Right 01/11/2016    Procedure: HERNIA REPAIR INGUINAL ADULT;  Surgeon: Kieth BrightlySeeplaputhur G Sankar, MD;  Location: [facility] ORS;  Service: General;  Laterality: Right;  . TONSILLECTOMY     childhood    Home Medications:  Allergies as of 11/10/2017   No Known Allergies     Medication List        Accurate as of 11/10/17  4:54 PM. Always use your most recent med list.          acetaminophen 500 MG tablet Commonly known as:  TYLENOL Take 500 mg by mouth every 6 (six) hours as needed for mild pain.   finasteride 5 MG tablet Commonly known as:  PROSCAR Take 1 tablet (5 mg total) by mouth daily.   gabapentin 100 MG capsule Commonly known as:  NEURONTIN Take 1 capsule (100 mg total) by mouth 3 (three) times daily.   multivitamin tablet Take 1 tablet by mouth daily.   MULTI-VITAMINS Tabs Take by mouth.   mupirocin ointment 2 % Commonly known as:  BACTROBAN Apply 1 application topically 3 (three) times daily.       Allergies: No Known Allergies  Family History: Family History  Problem Relation Age of Onset  . Hypertension Maternal Grandmother   . Prostate cancer Neg Hx   . Kidney cancer Neg Hx   . Bladder Cancer Neg Hx     Social History:  reports that he has been smoking cigarettes.  he has never used smokeless tobacco. He reports that he drinks alcohol. He reports that he does not use drugs.  ROS: UROLOGY Frequent Urination?: No Hard to postpone urination?: No Burning/pain with urination?: Yes Get up at night to urinate?: No Leakage of urine?: No Urine stream starts and stops?: Yes Trouble starting stream?: No Do you have to strain to urinate?: No Blood in urine?: No Urinary tract infection?: No Sexually transmitted disease?: No Injury to kidneys or bladder?: No Painful intercourse?: No Weak stream?: No Erection problems?: No Penile pain?: No  Gastrointestinal Nausea?: No Vomiting?: No Indigestion/heartburn?: Yes Diarrhea?: No Constipation?: No  Constitutional Fever:  No Night sweats?: No Weight loss?: No Fatigue?: No  Skin Skin rash/lesions?: Yes Itching?: No  Eyes Blurred vision?: No Double vision?: No  Ears/Nose/Throat Sore throat?: No Sinus problems?: No  Hematologic/Lymphatic Swollen glands?: No Easy bruising?: No  Cardiovascular Leg swelling?: No Chest pain?: No  Respiratory Cough?:  No Shortness of breath?: No  Endocrine Excessive thirst?: No  Musculoskeletal Back pain?: No Joint pain?: No  Neurological Headaches?: Yes Dizziness?: Yes  Psychologic Depression?: No Anxiety?: No  Physical Exam: BP 111/76   Pulse 72   Ht 6' (1.829 m)   Wt 180 lb 8 oz (81.9 kg)   BMI 24.48 kg/m   Constitutional: Well nourished. Alert and oriented, No acute distress. HEENT: [place] AT, moist mucus membranes. Trachea midline, no masses. Cardiovascular: No clubbing, cyanosis, or edema. Respiratory: Normal respiratory effort, no increased work of breathing. Skin: No rashes, bruises or suspicious lesions. Lymph: No cervical or inguinal adenopathy. Neurologic: Grossly intact, no focal deficits, moving all 4 extremities. Psychiatric: Normal mood and affect.   Laboratory Data: Lab Results  Component Value Date   WBC 4.7 12/20/2016   HGB 15.6 12/20/2016   HCT 47.4 12/20/2016   MCV 85.4 12/20/2016   PLT 299 12/20/2016    Lab Results  Component Value Date   CREATININE 1.10 07/10/2017    Lab Results  Component Value Date   PSA 0.7 11/05/2016  PSA 0.8 in 03/2017  Lab Results  Component Value Date   HGBA1C 5.4 11/07/2016     Lab Results  Component Value Date   AST 17 07/10/2017   Lab Results  Component Value Date   ALT 14 07/10/2017   I have reviewed the labs.   Pertinent Imaging: Results for TRYTON, [facility]" (MRN [id number]) as of 11/11/2017 12:44  Ref. Range 11/10/2017 16:35  Scan Result Unknown 4 ml    Assessment & Plan:    1. Chronic Groin Pain/PFD - likely genitofemoral nerve irritation from piror  hernia repair. No evidence of retropritoneal, scrotal pathology or hernia recurrence. His bother is not terrible. He is reassured and will use PRN OTC meds. Also will refer to PT, but he is not certain his work schedule will allow it  2. Dysuria - no infection, neoplasia, or obstruction. Again suspect this is manifestation of anxiety spectrum problem and some early BPH symptoms. Rec PRN urinary analgesics such as AZO and continue tamsulosin + finasteride as he is improved.    3. Prostate cancer screening - up to date this year, continue annually until age 63 or so as average risk - return in May  4. BPH with LUTS  - IPSS score is 3/3  - Continue conservative management, avoiding bladder irritants and timed voiding's  - most bothersome symptoms is/are nocturia and intermittent dysuria  - Initiate alpha-blocker (tamsulosin 0.4 mg), discussed side effects   - continue the tamsulosin and finasteride  - RTC in May 2018 for I PSS, PSA and exam  5. Nocturia  - resolved  6. Rash  - cultures and STI testing are negative  - will refer to dermatology  7. Blood in stool  - advised patient to contact PCP as it may be a sign of colon cancer  Return in about 5 months (around 04/10/2018) for IPSS, PSA and exam.  These notes generated with voice recognition software. I apologize for typographical errors.  Michiel Cowboy, PA-C  [facility] [address], [address] [place], [place] [address] [phone]

## 2017-11-10 ENCOUNTER — Encounter: Payer: Self-pay | Admitting: Urology

## 2017-11-10 ENCOUNTER — Ambulatory Visit (INDEPENDENT_AMBULATORY_CARE_PROVIDER_SITE_OTHER): Payer: 59 | Admitting: Urology

## 2017-11-10 VITALS — BP 111/76 | HR 72 | Ht 72.0 in | Wt 180.5 lb

## 2017-11-10 DIAGNOSIS — N138 Other obstructive and reflux uropathy: Secondary | ICD-10-CM

## 2017-11-10 DIAGNOSIS — R21 Rash and other nonspecific skin eruption: Secondary | ICD-10-CM | POA: Diagnosis not present

## 2017-11-10 DIAGNOSIS — N401 Enlarged prostate with lower urinary tract symptoms: Secondary | ICD-10-CM

## 2017-11-10 DIAGNOSIS — M6289 Other specified disorders of muscle: Secondary | ICD-10-CM | POA: Diagnosis not present

## 2017-11-10 DIAGNOSIS — R300 Dysuria: Secondary | ICD-10-CM

## 2017-11-10 LAB — BLADDER SCAN AMB NON-IMAGING: Scan Result: 4

## 2017-11-11 ENCOUNTER — Encounter: Payer: Self-pay | Admitting: Urology

## 2017-12-01 ENCOUNTER — Encounter: Payer: Self-pay | Admitting: Family Medicine

## 2017-12-01 ENCOUNTER — Ambulatory Visit (INDEPENDENT_AMBULATORY_CARE_PROVIDER_SITE_OTHER): Payer: 59 | Admitting: Family Medicine

## 2017-12-01 VITALS — BP 110/64 | HR 76 | Temp 97.6°F | Resp 16 | Ht 71.5 in | Wt 179.5 lb

## 2017-12-01 DIAGNOSIS — Z0001 Encounter for general adult medical examination with abnormal findings: Secondary | ICD-10-CM

## 2017-12-01 DIAGNOSIS — H9319 Tinnitus, unspecified ear: Secondary | ICD-10-CM

## 2017-12-01 DIAGNOSIS — R002 Palpitations: Secondary | ICD-10-CM | POA: Diagnosis not present

## 2017-12-01 DIAGNOSIS — N400 Enlarged prostate without lower urinary tract symptoms: Secondary | ICD-10-CM

## 2017-12-01 DIAGNOSIS — Z1211 Encounter for screening for malignant neoplasm of colon: Secondary | ICD-10-CM

## 2017-12-01 DIAGNOSIS — Z0000 Encounter for general adult medical examination without abnormal findings: Secondary | ICD-10-CM | POA: Diagnosis not present

## 2017-12-01 DIAGNOSIS — R1032 Left lower quadrant pain: Secondary | ICD-10-CM | POA: Diagnosis not present

## 2017-12-01 DIAGNOSIS — G8929 Other chronic pain: Secondary | ICD-10-CM | POA: Diagnosis not present

## 2017-12-01 NOTE — Progress Notes (Signed)
Name: Joseph Strong   MRN: [id number]    DOB: 1971-09-03   Date:12/01/2017       Progress Note  Subjective  Chief Complaint  Chief Complaint  Patient presents with  . Annual Exam    HPI  Pt. Presents for Complete Physical Exam.  He follows up with Urology for BPH. He is due for colon cancer screening.    Past Medical History:  Diagnosis Date  . Burning with urination 01-10-16  . Constipation   . Dysrhythmia    "OCCASSIONAL SPEEDING UP OF HEART RATE" PT THINKING IT MAY BE DUE TO HERNIA  . Headache    MIGRAINES  . Inguinal hernia    left     Past Surgical History:  Procedure Laterality Date  . HERNIA REPAIR Left 1998   AND UMBILICAL  . HERNIA REPAIR     47 years of age  . INGUINAL HERNIA REPAIR Right 01/11/2016   Procedure: HERNIA REPAIR INGUINAL ADULT;  Surgeon: Kieth Brightly, MD;  Location: [facility] ORS;  Service: General;  Laterality: Right;  . TONSILLECTOMY     childhood    Family History  Problem Relation Age of Onset  . Hypertension Maternal Grandmother   . Prostate cancer Neg Hx   . Kidney cancer Neg Hx   . Bladder Cancer Neg Hx     Social History   Socioeconomic History  . Marital status: Single    Spouse name: Not on file  . Number of children: Not on file  . Years of education: Not on file  . Highest education level: Not on file  Social Needs  . Financial resource strain: Not on file  . Food insecurity - worry: Not on file  . Food insecurity - inability: Not on file  . Transportation needs - medical: Not on file  . Transportation needs - non-medical: Not on file  Occupational History  . Not on file  Tobacco Use  . Smoking status: Light Tobacco Smoker    Types: Cigars  . Smokeless tobacco: Never Used  Substance and Sexual Activity  . Alcohol use: Yes    Alcohol/week: 0.0 oz    Comment: occasional  . Drug use: No  . Sexual activity: Not Currently  Other Topics Concern  . Not on file  Social History Narrative  . Not on file      Current Outpatient Medications:  .  acetaminophen (TYLENOL) 500 MG tablet, Take 500 mg by mouth every 6 (six) hours as needed for mild pain., Disp: , Rfl:  .  finasteride (PROSCAR) 5 MG tablet, Take 1 tablet (5 mg total) by mouth daily., Disp: 90 tablet, Rfl: 3 .  Multiple Vitamin (MULTIVITAMIN) tablet, Take 1 tablet by mouth daily., Disp: , Rfl:  .  gabapentin (NEURONTIN) 100 MG capsule, Take 1 capsule (100 mg total) by mouth 3 (three) times daily. (Patient not taking: Reported on 11/10/2017), Disp: 90 capsule, Rfl: 1 .  Multiple Vitamin (MULTI-VITAMINS) TABS, Take by mouth., Disp: , Rfl:  .  mupirocin ointment (BACTROBAN) 2 %, Apply 1 application topically 3 (three) times daily. (Patient not taking: Reported on 09/10/2017), Disp: 22 g, Rfl: 0  No Known Allergies   Review of Systems  Constitutional: Negative for chills, fever (flushed feeling at times.), malaise/fatigue and weight loss.  HENT: Positive for tinnitus (occasional ringing in the ears.). Negative for congestion, ear pain, sinus pain and sore throat.   Eyes: Negative for blurred vision and double vision.  Respiratory: Negative for cough, sputum production,  shortness of breath and wheezing.   Cardiovascular: Positive for palpitations (occasonal sensation of heart racing with increased caffeine intake.). Negative for chest pain and leg swelling.  Gastrointestinal: Positive for constipation and heartburn. Negative for abdominal pain, blood in stool, diarrhea, nausea and vomiting.  Genitourinary: Negative for dysuria and hematuria.  Musculoskeletal: Negative for back pain, joint pain and neck pain.  Neurological: Positive for dizziness and headaches.  Psychiatric/Behavioral: Negative for depression. The patient is not nervous/anxious and does not have insomnia.     Objective  Vitals:   12/01/17 1542  BP: 110/64  Pulse: 76  Resp: 16  Temp: 97.6 F (36.4 C)  TempSrc: Oral  Weight: 179 lb 8 oz (81.4 kg)  Height: 5'  11.5" (1.816 m)    Physical Exam  Constitutional: He is oriented to person, place, and time and well-developed, well-nourished, and in no distress.  HENT:  Head: Normocephalic and atraumatic.  Right Ear: External ear normal.  Left Ear: External ear normal.  Mouth/Throat: Oropharynx is clear and moist.  Cardiovascular: Normal rate, regular rhythm and normal heart sounds.  No murmur heard. Pulmonary/Chest: Effort normal and breath sounds normal. He has no wheezes.  Abdominal: Soft. There is no tenderness.  Genitourinary: Testes/scrotum normal and penis normal.  Genitourinary Comments: Normal abdominal exam, surgical inguinal hernia repair scars, discomfort in the left scrotal sac without any clear evidence of pathology.   Musculoskeletal: He exhibits no edema.  Neurological: He is alert and oriented to person, place, and time.  Psychiatric: Mood, memory, affect and judgment normal.  Nursing note and vitals reviewed.      Assessment & Plan  1. Encounter for annual physical exam Obtain age-appropriate laboratory screening - CBC with Differential/Platelet - COMPLETE METABOLIC PANEL WITH GFR - Lipid panel - TSH - VITAMIN D 25 Hydroxy (Vit-D Deficiency, Fractures)  2. Screening for colon cancer  - Ambulatory referral to Gastroenterology  3. Chronic pain of left groin Suspect neuropathic stop it if pain, patient has had a negative workup so far, referral to gastroenterology for colonoscopy - Ambulatory referral to Gastroenterology   Kathi LudwigSyed Asad A. Faylene KurtzShah [facility] 12/01/2017 3:57 PM

## 2017-12-02 LAB — COMPLETE METABOLIC PANEL WITH GFR
AG Ratio: 1.3 (calc) (ref 1.0–2.5)
ALT: 13 U/L (ref 9–46)
AST: 16 U/L (ref 10–40)
Albumin: 4.2 g/dL (ref 3.6–5.1)
Alkaline phosphatase (APISO): 53 U/L (ref 40–115)
BUN: 12 mg/dL (ref 7–25)
CO2: 25 mmol/L (ref 20–32)
Calcium: 9.9 mg/dL (ref 8.6–10.3)
Chloride: 105 mmol/L (ref 98–110)
Creat: 1.16 mg/dL (ref 0.60–1.35)
GFR, Est African American: 87 mL/min/{1.73_m2} (ref >=60)
GFR, Est Non African American: 75 mL/min/{1.73_m2} (ref >=60)
Globulin: 3.2 g/dL (calc) (ref 1.9–3.7)
Glucose, Bld: 87 mg/dL (ref 65–99)
Potassium: 4 mmol/L (ref 3.5–5.3)
Sodium: 138 mmol/L (ref 135–146)
Total Bilirubin: 0.4 mg/dL (ref 0.2–1.2)
Total Protein: 7.4 g/dL (ref 6.1–8.1)

## 2017-12-02 LAB — VITAMIN D 25 HYDROXY (VIT D DEFICIENCY, FRACTURES): Vit D, 25-Hydroxy: 32 ng/mL (ref 30–100)

## 2017-12-02 LAB — CBC WITH DIFFERENTIAL/PLATELET
Basophils Absolute: 29 cells/uL (ref 0–200)
Basophils Relative: 0.5 %
Eosinophils Absolute: 122 cells/uL (ref 15–500)
Eosinophils Relative: 2.1 %
HCT: 45.8 % (ref 38.5–50.0)
Hemoglobin: 15.4 g/dL (ref 13.2–17.1)
Lymphs Abs: 1792 cells/uL (ref 850–3900)
MCH: 27.7 pg (ref 27.0–33.0)
MCHC: 33.6 g/dL (ref 32.0–36.0)
MCV: 82.5 fL (ref 80.0–100.0)
MPV: 10.4 fL (ref 7.5–12.5)
Monocytes Relative: 10.8 %
Neutro Abs: 3231 cells/uL (ref 1500–7800)
Neutrophils Relative %: 55.7 %
Platelets: 304 10*3/uL (ref 140–400)
RBC: 5.55 10*6/uL (ref 4.20–5.80)
RDW: 12.4 % (ref 11.0–15.0)
Total Lymphocyte: 30.9 %
WBC mixed population: 626 cells/uL (ref 200–950)
WBC: 5.8 10*3/uL (ref 3.8–10.8)

## 2017-12-02 LAB — LIPID PANEL
Cholesterol: 169 mg/dL (ref <200)
HDL: 67 mg/dL (ref >40)
LDL Cholesterol (Calc): 88 mg/dL (calc)
Non-HDL Cholesterol (Calc): 102 mg/dL (calc) (ref <130)
Total CHOL/HDL Ratio: 2.5 (calc) (ref <5.0)
Triglycerides: 46 mg/dL (ref <150)

## 2017-12-02 LAB — TSH: TSH: 0.4 mIU/L (ref 0.40–4.50)

## 2017-12-04 ENCOUNTER — Encounter: Payer: Self-pay | Admitting: Gastroenterology

## 2017-12-08 ENCOUNTER — Telehealth: Payer: Self-pay | Admitting: Gastroenterology

## 2017-12-08 NOTE — Telephone Encounter (Signed)
Patient called & l/m on voice mail 12-05-17 @ 4:52pm. I tried calling him back this morning and l/m to return our call to schedule an appointment.

## 2017-12-17 ENCOUNTER — Encounter (INDEPENDENT_AMBULATORY_CARE_PROVIDER_SITE_OTHER): Payer: Self-pay

## 2017-12-17 ENCOUNTER — Ambulatory Visit (INDEPENDENT_AMBULATORY_CARE_PROVIDER_SITE_OTHER): Payer: Commercial Managed Care - HMO | Admitting: Gastroenterology

## 2017-12-17 ENCOUNTER — Encounter: Payer: Self-pay | Admitting: Gastroenterology

## 2017-12-17 ENCOUNTER — Other Ambulatory Visit: Payer: Self-pay

## 2017-12-17 VITALS — BP 130/72 | HR 67 | Ht 66.0 in | Wt 186.4 lb

## 2017-12-17 DIAGNOSIS — K5900 Constipation, unspecified: Secondary | ICD-10-CM

## 2017-12-17 DIAGNOSIS — Z1212 Encounter for screening for malignant neoplasm of rectum: Secondary | ICD-10-CM

## 2017-12-17 DIAGNOSIS — K625 Hemorrhage of anus and rectum: Secondary | ICD-10-CM | POA: Diagnosis not present

## 2017-12-17 DIAGNOSIS — Z1211 Encounter for screening for malignant neoplasm of colon: Secondary | ICD-10-CM

## 2017-12-17 NOTE — Progress Notes (Signed)
Melodie Bouillon [address]  [address]  [place], [place] [address]  Main: [phone]  Fax: [phone]   Gastroenterology Consultation  Referring Provider:     Ellyn Hack, MD Primary Care Physician:  Ellyn Hack, MD Primary Gastroenterologist:  Dr. Melodie Bouillon Reason for Consultation:     CRC screening        HPI:   Joseph Strong is a 47 y.o. y/o male referred for consultation & management  by Dr. Sherryll Burger, Melanee Spry, MD.  Patient referred by primary care physician to evaluate for colorectal cancer screening.  Patient states both his mother and father have passed away, and he does not know from what cause.  Does not know if they ever had colon cancer.  Denies any loss of appetite.  No dysphagia, no nausea vomiting, no altered bowel habits.  Reports history of constipation and reports intermittent bright red blood per rectum with hard stool.  Has had 3 episodes of bright red blood only on the toilet paper, none in the stool.  Last episode was a week and half ago.  These episodes have occurred over the last 3 months.  Reports occasional heartburn once or twice a month.  Reports smoking a cigar, occasional alcohol use only.  Denies any drug use.  No previous colonoscopies or EGDs.  Past Medical History:  Diagnosis Date  . Burning with urination 01-10-16  . Constipation   . Dysrhythmia    "OCCASSIONAL SPEEDING UP OF HEART RATE" PT THINKING IT MAY BE DUE TO HERNIA  . Headache    MIGRAINES  . Inguinal hernia    left     Past Surgical History:  Procedure Laterality Date  . HERNIA REPAIR Left 1998   AND UMBILICAL  . HERNIA REPAIR     47 years of age  . INGUINAL HERNIA REPAIR Right 01/11/2016   Procedure: HERNIA REPAIR INGUINAL ADULT;  Surgeon: Kieth Brightly, MD;  Location: [facility] ORS;  Service: General;  Laterality: Right;  . TONSILLECTOMY     childhood    Prior to Admission medications   Medication Sig Start Date End Date Taking? Authorizing  Provider  acetaminophen (TYLENOL) 500 MG tablet Take 500 mg by mouth every 6 (six) hours as needed for mild pain.   Yes [provider]  finasteride (PROSCAR) 5 MG tablet Take 1 tablet (5 mg total) by mouth daily. 04/08/17  Yes McGowan, Carollee Herter A, PA-C  Multiple Vitamin (MULTIVITAMIN) tablet Take 1 tablet by mouth daily.   Yes [provider]    Family History  Problem Relation Age of Onset  . Hypertension Maternal Grandmother   . Prostate cancer Neg Hx   . Kidney cancer Neg Hx   . Bladder Cancer Neg Hx      Social History   Tobacco Use  . Smoking status: Light Tobacco Smoker    Types: Cigars  . Smokeless tobacco: Never Used  Substance Use Topics  . Alcohol use: Yes    Alcohol/week: 0.0 oz    Comment: occasional  . Drug use: No    Allergies as of 12/17/2017  . (No Known Allergies)    Review of Systems:    All systems reviewed and negative except where noted in HPI.   Physical Exam:  BP 130/72   Pulse 67   Ht 5\' 6"  (1.676 m)   Wt 186 lb 6.4 oz (84.6 kg)   BMI 30.09 kg/m  No LMP for male patient. Psych:  Alert and cooperative. Normal mood and affect. General:   Alert,  Well-developed, well-nourished, pleasant and cooperative in NAD Head:  Normocephalic and atraumatic. Eyes:  Sclera clear, no icterus.   Conjunctiva pink. Ears:  Normal auditory acuity. Nose:  No deformity, discharge, or lesions. Mouth:  No deformity or lesions,oropharynx pink & moist. Neck:  Supple; no masses or thyromegaly. Lungs:  Respirations even and unlabored.  Clear throughout to auscultation.   No wheezes, crackles, or rhonchi. No acute distress. Heart:  Regular rate and rhythm; no murmurs, clicks, rubs, or gallops. Abdomen:  Normal bowel sounds.  No bruits.  Soft, non-tender and non-distended without masses, hepatosplenomegaly or hernias noted.  No guarding or rebound tenderness.    Msk:  Symmetrical without gross deformities. Good, equal movement & strength  bilaterally. Pulses:  Normal pulses noted. Extremities:  No clubbing or edema.  No cyanosis. Neurologic:  Alert and oriented x3;  grossly normal neurologically. Skin:  Intact without significant lesions or rashes. No jaundice. Lymph Nodes:  No significant cervical adenopathy. Psych:  Alert and cooperative. Normal mood and affect.   Labs: CBC    Component Value Date/Time   WBC 5.8 12/01/2017 1640   RBC 5.55 12/01/2017 1640   HGB 15.4 12/01/2017 1640   HGB 16.5 09/30/2014 1629   HCT 45.8 12/01/2017 1640   HCT 50.1 09/30/2014 1629   PLT 304 12/01/2017 1640   PLT 253 09/30/2014 1629   MCV 82.5 12/01/2017 1640   MCV 86 09/30/2014 1629   MCH 27.7 12/01/2017 1640   MCHC 33.6 12/01/2017 1640   RDW 12.4 12/01/2017 1640   RDW 13.4 09/30/2014 1629   LYMPHSABS 1,792 12/01/2017 1640   LYMPHSABS 2.1 09/30/2014 1629   MONOABS 611 12/20/2016 1630   MONOABS 0.8 09/30/2014 1629   EOSABS 122 12/01/2017 1640   EOSABS 0.1 09/30/2014 1629   BASOSABS 29 12/01/2017 1640   BASOSABS 0.0 09/30/2014 1629   CMP     Component Value Date/Time   NA 138 12/01/2017 1640   NA 139 07/10/2017 1703   NA 140 09/30/2014 1629   K 4.0 12/01/2017 1640   K 3.8 09/30/2014 1629   CL 105 12/01/2017 1640   CL 107 09/30/2014 1629   CO2 25 12/01/2017 1640   CO2 27 09/30/2014 1629   GLUCOSE 87 12/01/2017 1640   GLUCOSE 92 09/30/2014 1629   BUN 12 12/01/2017 1640   BUN 18 07/10/2017 1703   BUN 13 09/30/2014 1629   CREATININE 1.16 12/01/2017 1640   CALCIUM 9.9 12/01/2017 1640   CALCIUM 8.7 09/30/2014 1629   PROT 7.4 12/01/2017 1640   PROT 7.0 07/10/2017 1703   PROT 7.8 09/30/2014 1629   ALBUMIN 4.2 07/10/2017 1703   ALBUMIN 3.9 09/30/2014 1629   AST 16 12/01/2017 1640   AST 21 09/30/2014 1629   ALT 13 12/01/2017 1640   ALT 24 09/30/2014 1629   ALKPHOS 60 07/10/2017 1703   ALKPHOS 61 09/30/2014 1629   BILITOT 0.4 12/01/2017 1640   BILITOT <0.2 07/10/2017 1703   BILITOT 0.4 09/30/2014 1629   GFRNONAA  75 12/01/2017 1640   GFRAA 87 12/01/2017 1640    Imaging Studies: No results found.  Assessment and Plan:   Joseph Strong is a 47 y.o. y/o male has been referred for colorectal cancer screening with intermittent bright red blood per rectum  Intermittent bright red blood per rectum is likely due to underlying hemorrhoids Patient reports history of constipation Patient encouraged to have a high-fiber diet to maintain  soft stool.  MiraLAX can be considered if constipation does not resolve with high-fiber diet.  High-fiber diet handout given.  No alarm symptoms present, no anemia on lab work. We will schedule for colorectal cancer screening as American Cancer Society recommends screening after 47 years of age.  AGA also recommend starting screening at 47 years of age for African-American patients.  I have discussed alternative options, risks & benefits,  which include, but are not limited to, bleeding, infection, perforation,respiratory complication & drug reaction.  The patient agrees with this plan & written consent will be obtained.    Hemorrhoid evaluation can also be done at the time of the colonoscopy.  Dr Melodie Bouillon

## 2018-01-21 ENCOUNTER — Other Ambulatory Visit: Payer: Self-pay

## 2018-01-23 ENCOUNTER — Other Ambulatory Visit: Payer: Self-pay

## 2018-01-23 DIAGNOSIS — Z1211 Encounter for screening for malignant neoplasm of colon: Secondary | ICD-10-CM

## 2018-02-03 ENCOUNTER — Telehealth: Payer: Self-pay | Admitting: Gastroenterology

## 2018-02-03 NOTE — Telephone Encounter (Signed)
Patient needs to reschedule his procedure that was tomorrow due to no transportation. Please call after 4 PM

## 2018-02-03 NOTE — Telephone Encounter (Signed)
[facility] notified to cancel procedure tomorrow.Joseph Strong(Trish).

## 2018-02-04 ENCOUNTER — Encounter: Admission: RE | Payer: Self-pay | Source: Ambulatory Visit

## 2018-02-04 ENCOUNTER — Ambulatory Visit: Admission: RE | Admit: 2018-02-04 | Payer: 59 | Source: Ambulatory Visit | Admitting: Gastroenterology

## 2018-02-04 SURGERY — COLONOSCOPY WITH PROPOFOL
Anesthesia: General

## 2018-02-04 NOTE — Telephone Encounter (Signed)
PT LEFT VM HE STATES HE CALLED 02-03-18 TO CANCEL A PROCEDURE AND HAS NOT HEARD FROM ANYONE TO CALL HIM BACK HE STATES HE WANTS TO CANCEL PROCEDURE FOR 03-13-198 AT 3:30 PLEASE CALL BACK

## 2018-02-04 NOTE — Telephone Encounter (Signed)
PT WANTS TO R/S HIS APT THAT HE HAD TO CANCEL

## 2018-02-19 NOTE — Telephone Encounter (Signed)
Left message to contact office to reschedule colonoscopy.

## 2018-02-24 ENCOUNTER — Telehealth: Payer: Self-pay

## 2018-02-24 NOTE — Telephone Encounter (Signed)
LVM for pt to contact me or Debbie to schedule his colonoscopy.

## 2018-02-25 NOTE — Telephone Encounter (Signed)
Left message to contact office by MO to reschedule colonoscopy. See note from 02/24/18.

## 2018-03-26 ENCOUNTER — Telehealth: Payer: Self-pay

## 2018-03-26 NOTE — Telephone Encounter (Signed)
LMTCO to reschedule colonscopy.

## 2018-03-27 NOTE — Telephone Encounter (Signed)
Left message yesterday for pt to contact office to reschedule colonoscopy.

## 2018-03-30 ENCOUNTER — Encounter: Payer: Self-pay | Admitting: Urology

## 2018-03-30 ENCOUNTER — Other Ambulatory Visit: Payer: 59

## 2018-03-30 ENCOUNTER — Other Ambulatory Visit: Payer: Self-pay

## 2018-03-30 DIAGNOSIS — N400 Enlarged prostate without lower urinary tract symptoms: Secondary | ICD-10-CM

## 2018-04-03 IMAGING — MR MR PELVIS WO/W CM
8 of 10 series · 38 of 48 positions shown · IV contrast (multihance)
Comparison: None.

CLINICAL DATA: 45-year-old male status post bilateral inguinal
hernia repair with concern for persistent discomfort in both groins,
left upper quadrant and mid back.

EXAM:
MRI PELVIS WITHOUT AND WITH CONTRAST
TECHNIQUE: Multiplanar multisequence MR imaging of the pelvis was performed
both before and after administration of intravenous contrast.
CONTRAST:  15mL MULTIHANCE GADOBENATE DIMEGLUMINE 529 MG/ML IV SOLN

[Series 2: STIR · coronal · 5.1mm · 1.56mm/px · 4 of 37 slices shown (1 of 2)]
[im 1/37]
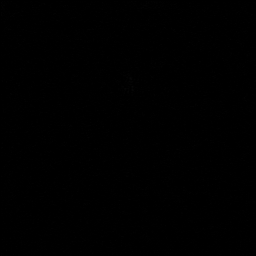
[im 13/37]
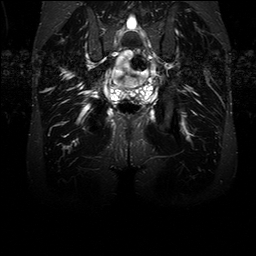
[im 25/37]
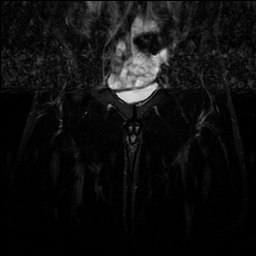
[im 37/37]
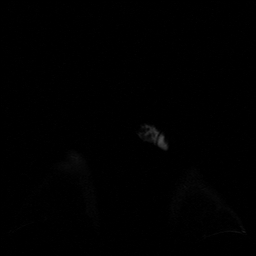

[Series 3: T1 · coronal · 5.0mm · 1.56mm/px · 4 of 37 slices shown (1 of 3)]
[im 1/37]
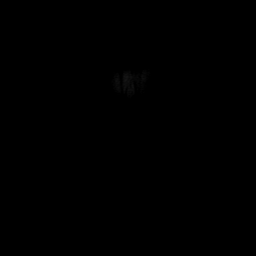
[im 13/37]
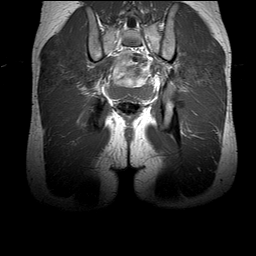
[im 25/37]
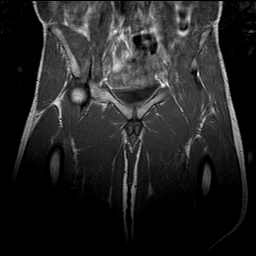
[im 37/37]
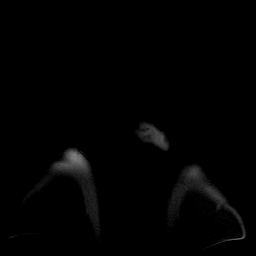

[Series 4: T1 fat-sat · axial · non-contrast · 5.0mm · 0.78mm/px · z∈[-247,+121]mm · 6 of 60 slices shown]
[im 1/60]
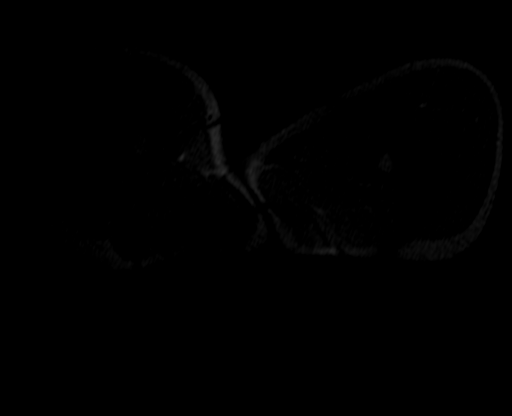
[im 12/60]
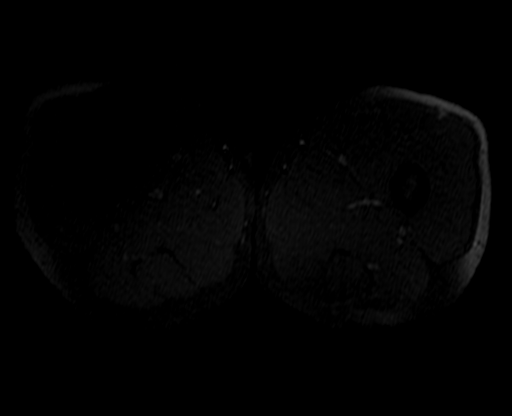
[im 24/60]
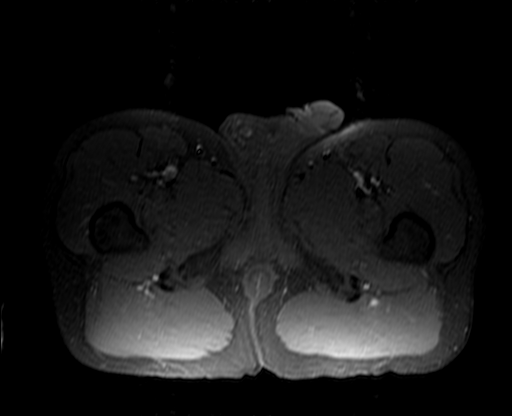
[im 36/60]
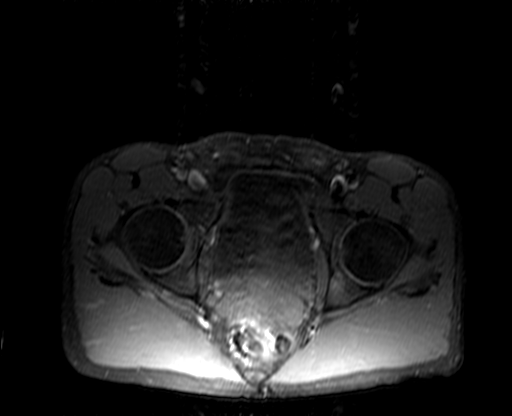
[im 48/60]
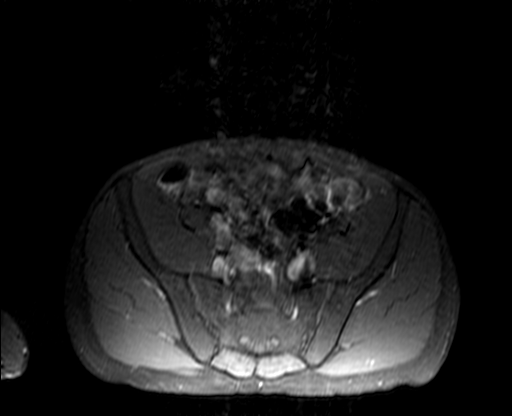
[im 60/60]
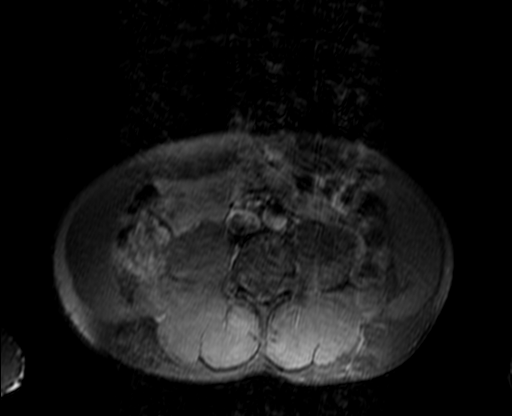

[Series 5: T1 · axial · 5.0mm · 0.78mm/px · z∈[-247,+121]mm · 6 of 60 slices shown (2 of 3)]
[im 1/60]
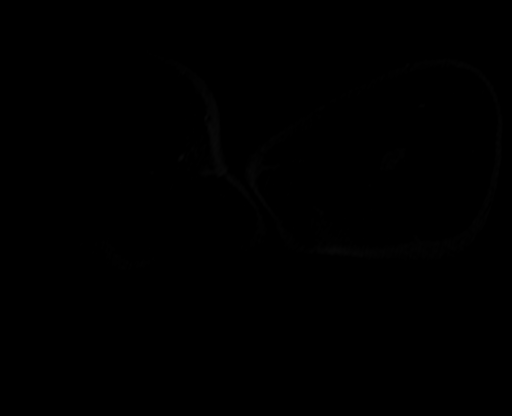
[im 12/60]
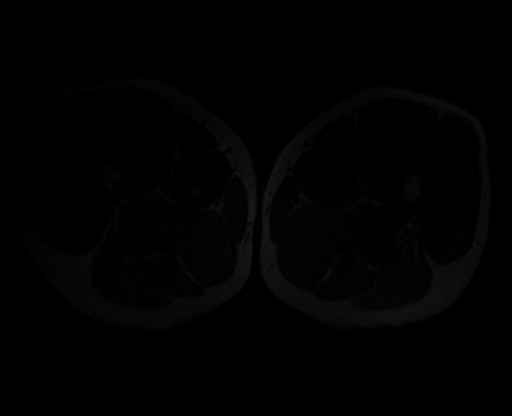
[im 24/60]
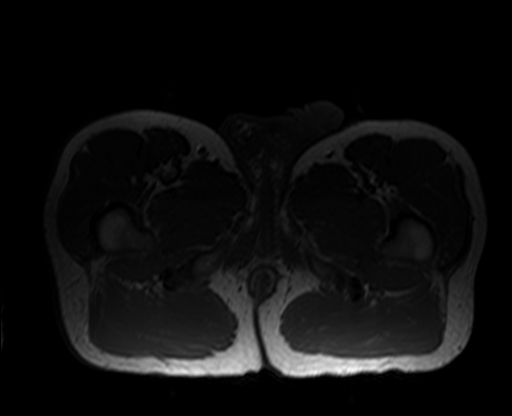
[im 36/60]
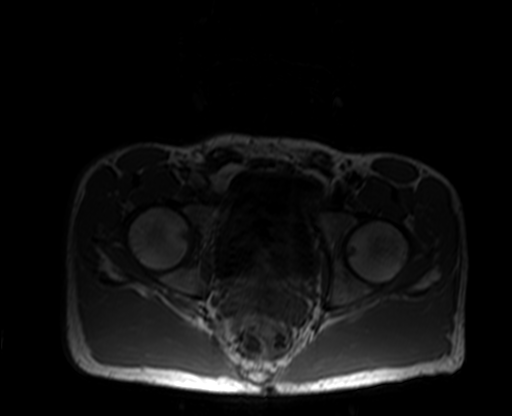
[im 48/60]
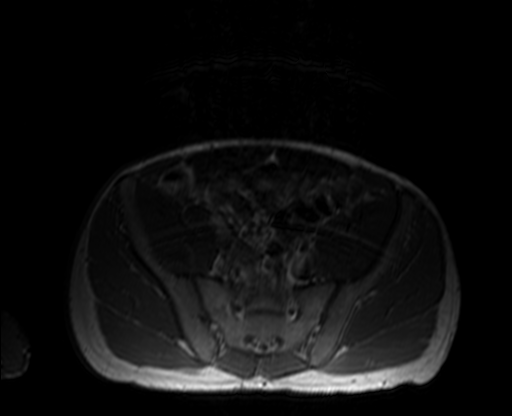
[im 60/60]
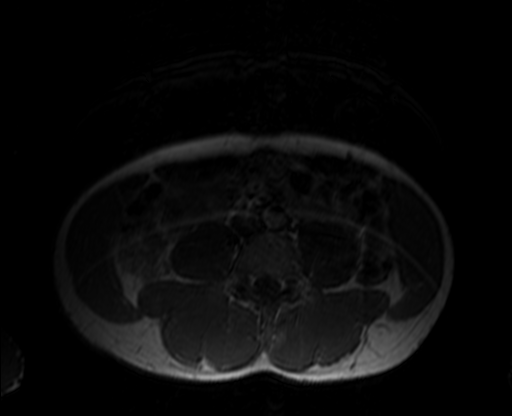

[Series 6: STIR · axial · 5.0mm · 1.56mm/px · z∈[-247,+121]mm · 6 of 60 slices shown (2 of 2)]
[im 1/60]
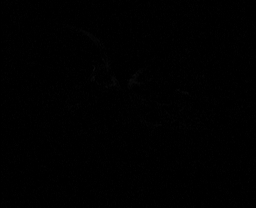
[im 12/60]
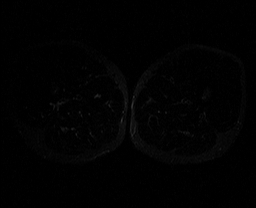
[im 24/60]
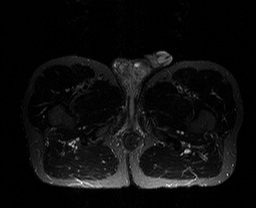
[im 36/60]
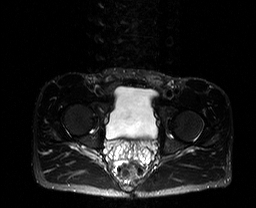
[im 48/60]
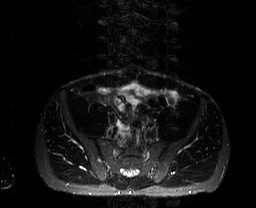
[im 60/60]
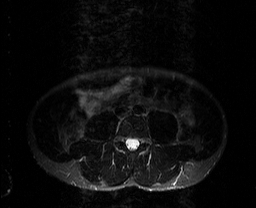

[Series 7: T2 fat-sat · sagittal · 6.0mm · 1.56mm/px · 4 of 45 slices shown]
[im 1/45]
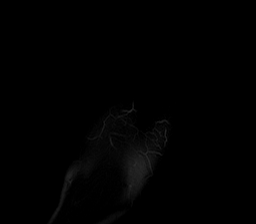
[im 15/45]
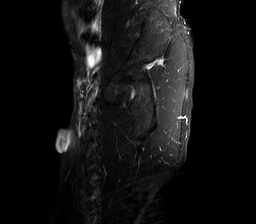
[im 30/45]
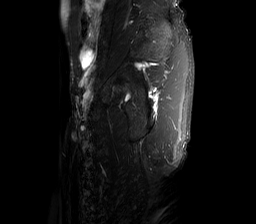
[im 45/45]
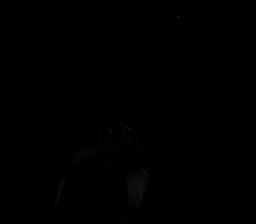

[Series 8: T1 · sagittal · 6.0mm · 1.56mm/px · 4 of 45 slices shown (3 of 3)]
[im 1/45]
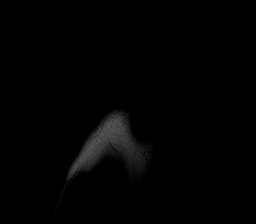
[im 15/45]
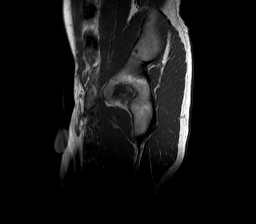
[im 30/45]
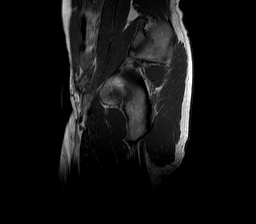
[im 45/45]
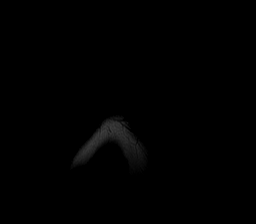

[Series 9: T1 fat-sat post-contrast · axial · 5.0mm · 0.78mm/px · z∈[-247,-29]mm · 4 of 60 slices shown]
[im 1/60]
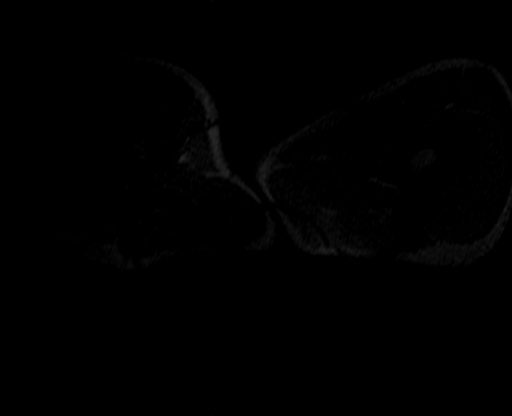
[im 12/60]
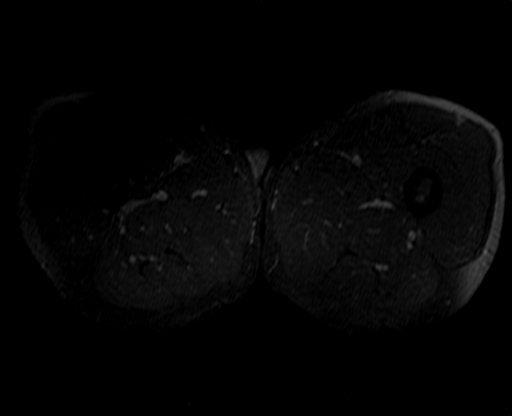
[im 24/60]
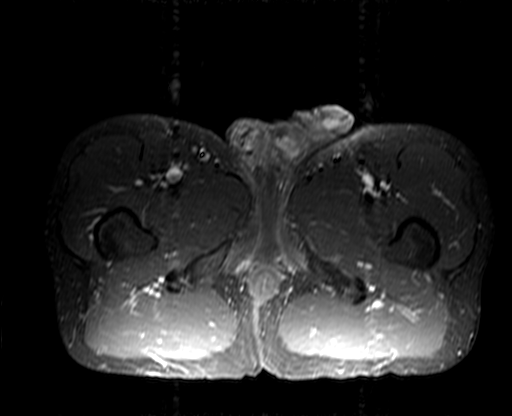
[im 36/60]
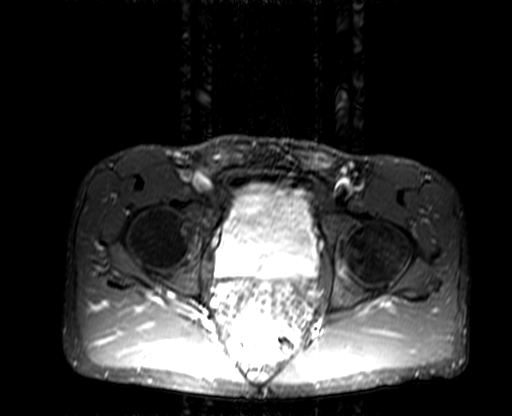

[38 of 48 positions shown; findings below may reference images not displayed]

FINDINGS: Urinary Tract: No abnormality visualized. The urinary bladder is
physiologically distended without intraluminal mass or calculus. In
penile urethra is unremarkable.

Bowel:  Unremarkable visualized pelvic bowel loops.

Vascular/Lymphatic: No pathologically enlarged lymph nodes. No
significant vascular abnormality seen.

Reproductive: Normal size prostate. Seminal vesicles, corpora
cavernosa, corpus spongiosum and intrascrotal contents are
unremarkable.

Other:  No apparent herniation into the inguinal canals.

Musculoskeletal: No bone marrow edema, fracture or bone destruction.
Hip joints demonstrate no joint effusion. There is mild degenerative
joint space narrowing of both hips with slight bony protuberance
superolaterally off the right femoral head neck junction. No focal
chondral defect. No definite labral tear. The sacroiliac joints,
sacral ala, pubic symphysis and rami are intact. The iliac bones are
maintained bilaterally. The L4-5 and L5-S1 discs are nonacute.
IMPRESSION: No findings for the patient's bilateral groin pain. No recurrent
inguinal hernia is identified.

## 2018-04-08 NOTE — Progress Notes (Deleted)
04/09/2018 1:17 PM   Joseph Strong [birth year] [id number]  Referring provider: Ellyn Hack, MD [address] [place], [place] [address]  No chief complaint on file.   HPI: 47 yo AAM with BPH with LU TS, chronic pain and dysuria who presents for a follow up.  Background history Patient is a 47 year old African American male who is referred by Dr. [name] for dysuria and chronic groin pain.  He has been having pain in his left suprapubic region for greater than one year.  8-9/10 pain.  "Pain that feels numb at the same time, it's hard to describe."   Pain comes and goes.  It can stay for one hour and a half.  Drinking fluids makes it worse.  Multi vitamin makes the pain better.   He states the pain does not cause nausea or vomiting.  He states the pain moves around his abdomen.  He states he sometime has cramping pain.  He admits to having constipation.  His UA was unremarkable.   He states he is sexually active.  He complains of a rash on his arm that occurs intermittently.     CT Renal stone study performed on 09/03/2017 noted small recurrent ventral hernia at the site of prior repair, with a small (less than 4 cubic cm) volume of herniated omental adipose tissue.  Findings in both inguinal regions suggesting prior inguinal hernia repairs. Dextroconvex thoracic scoliosis and mild levoconvex lumbar scoliosis.  Prominent stool throughout the colon favors constipation.  A specific cause for the patient's left lower abdominal/groin pain and flank pain is not identified.    Cystoscopy performed on 09/16/2017 with Dr. Berneice Heinrich was negative.    Today, his IPSS score today is ***, which is *** lower urinary tract symptomatology.  He is *** with his quality life due to his urinary symptoms.  His PVR is *** mL.   His previous I PSS score 5/2.  His previous PVR was 4 mL.  He is currently on tamsulosin 0.4 mg and finasteride 5 mg daily.  He denies any dysuria, hematuria or  suprapubic pain.   He also denies any recent fevers, chills, nausea or vomiting.  He does not have a family history of PCa.  ***   He states he is still having the discomfort in his left suprapubic region, dysuria and intermittency.        Score:  1-7 Mild 8-19 Moderate 20-35 Severe  PMH: Past Medical History:  Diagnosis Date  . Burning with urination 01-10-16  . Constipation   . Dysrhythmia    "OCCASSIONAL SPEEDING UP OF HEART RATE" PT THINKING IT MAY BE DUE TO HERNIA  . Headache    MIGRAINES  . Inguinal hernia    left     Surgical History: Past Surgical History:  Procedure Laterality Date  . HERNIA REPAIR Left 1998   AND UMBILICAL  . HERNIA REPAIR     47 years of age  . INGUINAL HERNIA REPAIR Right 01/11/2016   Procedure: HERNIA REPAIR INGUINAL ADULT;  Surgeon: Kieth Brightly, MD;  Location: [facility] ORS;  Service: General;  Laterality: Right;  . TONSILLECTOMY     childhood    Home Medications:  Allergies as of 04/09/2018   No Known Allergies     Medication List        Accurate as of 04/08/18  1:17 PM. Always use your most recent med list.  acetaminophen 500 MG tablet Commonly known as:  TYLENOL Take 500 mg by mouth every 6 (six) hours as needed for mild pain.   finasteride 5 MG tablet Commonly known as:  PROSCAR Take 1 tablet (5 mg total) by mouth daily.   multivitamin tablet Take 1 tablet by mouth daily.       Allergies: No Known Allergies  Family History: Family History  Problem Relation Age of Onset  . Hypertension Maternal Grandmother   . Prostate cancer Neg Hx   . Kidney cancer Neg Hx   . Bladder Cancer Neg Hx     Social History:  reports that he has been smoking cigars.  He has never used smokeless tobacco. He reports that he drinks alcohol. He reports that he does not use drugs.  ROS:                                        Physical Exam: There were no vitals taken for this visit.  Constitutional:  Well nourished. Alert and oriented, No acute distress. HEENT: [place] AT, moist mucus membranes. Trachea midline, no masses. Cardiovascular: No clubbing, cyanosis, or edema. Respiratory: Normal respiratory effort, no increased work of breathing. Skin: No rashes, bruises or suspicious lesions. Lymph: No cervical or inguinal adenopathy. Neurologic: Grossly intact, no focal deficits, moving all 4 extremities. Psychiatric: Normal mood and affect.  Constitutional: Well nourished. Alert and oriented, No acute distress. HEENT: [place] AT, moist mucus membranes. Trachea midline, no masses. Cardiovascular: No clubbing, cyanosis, or edema. Respiratory: Normal respiratory effort, no increased work of breathing. GI: Abdomen is soft, non tender, non distended, no abdominal masses. Liver and spleen not palpable.  No hernias appreciated.  Stool sample for occult testing is not indicated.   GU: No CVA tenderness.  No bladder fullness or masses.  Patient with circumcised/uncircumcised phallus. ***Foreskin easily retracted***  Urethral meatus is patent.  No penile discharge. No penile lesions or rashes. Scrotum without lesions, cysts, rashes and/or edema.  Testicles are located scrotally bilaterally. No masses are appreciated in the testicles. Left and right epididymis are normal. Rectal: Patient with  normal sphincter tone. Anus and perineum without scarring or rashes. No rectal masses are appreciated. Prostate is approximately *** grams, *** nodules are appreciated. Seminal vesicles are normal. Skin: No rashes, bruises or suspicious lesions. Lymph: No cervical or inguinal adenopathy. Neurologic: Grossly intact, no focal deficits, moving all 4 extremities. Psychiatric: Normal mood and affect.    Laboratory Data: Lab Results  Component Value Date   WBC 5.8 12/01/2017   HGB 15.4 12/01/2017   HCT 45.8 12/01/2017   MCV 82.5 12/01/2017   PLT 304 12/01/2017    Lab Results  Component Value Date   CREATININE 1.16  12/01/2017    Lab Results  Component Value Date   PSA 0.7 11/05/2016  PSA 0.8 in 03/2017  Lab Results  Component Value Date   HGBA1C 5.4 11/07/2016     Lab Results  Component Value Date   AST 16 12/01/2017   Lab Results  Component Value Date   ALT 13 12/01/2017   I have reviewed the labs.   Pertinent Imaging: ***   Assessment & Plan:    1. Chronic Groin Pain/PFD - likely genitofemoral nerve irritation from piror hernia repair. No evidence of retropritoneal, scrotal pathology or hernia recurrence. His bother is not terrible. He is reassured and will use PRN OTC meds.  Also will refer to PT, but he is not certain his work schedule will allow it  2. Dysuria - no infection, neoplasia, or obstruction. Again suspect this is manifestation of anxiety spectrum problem and some early BPH symptoms. Rec PRN urinary analgesics such as AZO and continue tamsulosin + finasteride as he is improved.    3. BPH with LUTS  - IPSS score is 3/3  - Continue conservative management, avoiding bladder irritants and timed voiding's  - most bothersome symptoms is/are nocturia and intermittent dysuria  - Initiate alpha-blocker (tamsulosin 0.4 mg), discussed side effects   - continue the tamsulosin and finasteride  - RTC in May 2018 for I PSS, PSA and exam  4. Nocturia  - resolved  5. Rash  - cultures and STI testing are negative  - will refer to dermatology  6. Blood in stool Being evaluated by GI  No follow-ups on file.  These notes generated with voice recognition software. I apologize for typographical errors.  Michiel Cowboy, PA-C  [facility] [address], [address] [place], [place] [address] [phone]

## 2018-04-09 ENCOUNTER — Ambulatory Visit: Payer: 59 | Admitting: Urology

## 2018-04-09 ENCOUNTER — Encounter: Payer: Self-pay | Admitting: Urology

## 2018-04-09 ENCOUNTER — Other Ambulatory Visit: Payer: 59

## 2018-04-09 DIAGNOSIS — N400 Enlarged prostate without lower urinary tract symptoms: Secondary | ICD-10-CM

## 2018-04-10 LAB — PSA: Prostate Specific Ag, Serum: 1.1 ng/mL (ref 0.0–4.0)

## 2018-04-13 ENCOUNTER — Telehealth: Payer: Self-pay | Admitting: Radiology

## 2018-04-13 NOTE — Telephone Encounter (Signed)
Pt requests lab results from 04/09/2018. F/u appt for results isn't until 05/06/2018. Did not discuss results as they have not yet been reviewed by a provider.

## 2018-04-15 NOTE — Telephone Encounter (Signed)
Called patient. No answer. Unable to leave vm.   Mailed letter concerning rescheduling colonoscopy.

## 2018-04-22 ENCOUNTER — Telehealth: Payer: Self-pay

## 2018-04-22 NOTE — Telephone Encounter (Signed)
-----   Message from Harle Battiest, PA-C sent at 04/21/2018 11:28 AM EDT ----- Please let Joseph Strong know the his PSA is stable at 1.1.  We will see him on 05/06/2018.

## 2018-04-22 NOTE — Telephone Encounter (Signed)
Pt returned call and I read message from Shannon. 

## 2018-04-22 NOTE — Telephone Encounter (Signed)
Attempted to reach pt, vm not set up.

## 2018-04-24 ENCOUNTER — Encounter: Payer: Self-pay | Admitting: Family Medicine

## 2018-04-24 ENCOUNTER — Ambulatory Visit (INDEPENDENT_AMBULATORY_CARE_PROVIDER_SITE_OTHER): Payer: 59 | Admitting: Family Medicine

## 2018-04-24 VITALS — BP 116/82 | HR 92 | Temp 97.6°F | Resp 14 | Ht 66.0 in | Wt 178.6 lb

## 2018-04-24 DIAGNOSIS — L723 Sebaceous cyst: Secondary | ICD-10-CM

## 2018-04-24 DIAGNOSIS — K439 Ventral hernia without obstruction or gangrene: Secondary | ICD-10-CM

## 2018-04-24 DIAGNOSIS — L2082 Flexural eczema: Secondary | ICD-10-CM

## 2018-04-24 MED ORDER — NYSTATIN 100000 UNIT/GM EX POWD: Freq: Three times a day (TID) | CUTANEOUS | 2 refills | Status: DC | PRN

## 2018-04-24 MED ORDER — TRIAMCINOLONE ACETONIDE 0.1 % EX CREA: 1.0000 "application " | TOPICAL_CREAM | Freq: Two times a day (BID) | CUTANEOUS | 1 refills | Status: DC | PRN

## 2018-04-24 NOTE — Progress Notes (Signed)
BP 116/82   Pulse 92   Temp 97.6 F (36.4 C) (Oral)   Resp 14   Ht  (1.676 m)   Wt 178 lb 9.6 oz (81 kg)   SpO2 97%   BMI 28.83 kg/m    Subjective:    Patient ID: Joseph Strong, Joseph Strong    DOB: 11/04/1971, 47 y.o.   MRN: [id number]  HPI: Joseph Strong is a 47 y.o. Joseph Strong  Chief Complaint  Patient presents with  . Follow-up    HPI  Patient is new to me; previous provider left our practice  They set him up for a colonoscopy, but he needed to find someone to drive him; then he changed phone numbers and they just haven't been able to catch each other  Hypertension in the family; hernias run in the family; already had left inguinal herniorrhaphy in the late 1990's; had the right side done more recently, and now has a hernia above umbilicus  Dealing with prostate issues; sees Michiel Cowboy for enlarged prostate; on med for 9 months, has not taken anything for 4-5 months; urinating okay but nocturia x 2 sometimes; stream is pretty strong  We reviewed labs; excellent cholesterol panel; normal TSH, CBC, CMP  Rash in the elbows, about a year and a half  Gets itching bumps in the groin; uses corn starch  Depression screen [facility] 2/9 04/24/2018 12/01/2017 03/18/2017 12/20/2016 11/05/2016  Decreased Interest 0 0 0 0 0  Down, Depressed, Hopeless 0 0 0 0 0  PHQ - 2 Score 0 0 0 0 0    Relevant past medical, surgical, family and social history reviewed Past Medical History:  Diagnosis Date  . Burning with urination 01-10-16  . Constipation   . Dysrhythmia    "OCCASSIONAL SPEEDING UP OF HEART RATE" PT THINKING IT MAY BE DUE TO HERNIA  . Headache    MIGRAINES  . Inguinal hernia    left    Past Surgical History:  Procedure Laterality Date  . HERNIA REPAIR Left 1998   AND UMBILICAL  . HERNIA REPAIR     47 years of age  . INGUINAL HERNIA REPAIR Right 01/11/2016   Procedure: HERNIA REPAIR INGUINAL ADULT;  Surgeon: Kieth Brightly, MD;  Location: [facility] ORS;  Service: General;   Laterality: Right;  . TONSILLECTOMY     childhood   Family History  Problem Relation Age of Onset  . Hypertension Maternal Grandmother   . Prostate cancer Neg Hx   . Kidney cancer Neg Hx   . Bladder Cancer Neg Hx    Social History   Tobacco Use  . Smoking status: Light Tobacco Smoker    Types: Cigars, Cigarettes  . Smokeless tobacco: Never Used  Substance Use Topics  . Alcohol use: Yes    Alcohol/week: 0.0 oz    Comment: occasional  . Drug use: No    Interim medical history since last visit reviewed. Allergies and medications reviewed  Review of Systems Per HPI unless specifically indicated above     Objective:    BP 116/82   Pulse 92   Temp 97.6 F (36.4 C) (Oral)   Resp 14   Ht  (1.676 m)   Wt 178 lb 9.6 oz (81 kg)   SpO2 97%   BMI 28.83 kg/m   Wt Readings from Last 3 Encounters:  04/24/18 178 lb 9.6 oz (81 kg)  12/17/17 186 lb 6.4 oz (84.6 kg)  12/01/17 179 lb 8 oz (81.4 kg)  Physical Exam  Constitutional: He appears well-developed and well-nourished. No distress.  Eyes: No scleral icterus.  Cardiovascular: Normal rate and regular rhythm.  Pulmonary/Chest: Effort normal and breath sounds normal.  Abdominal: A hernia is present. Hernia confirmed positive in the ventral area.  Neurological: He is alert.  Skin: Rash (mild erythematous rash in AC fossa) noted. No pallor.  Sebaceous cyst present in groin  Psychiatric: He has a normal mood and affect. His mood appears not anxious. He does not exhibit a depressed mood.    Results for orders placed or performed in visit on 04/09/18  PSA  Result Value Ref Range   Prostate Specific Ag, Serum 1.1 0.0 - 4.0 ng/mL      Assessment & Plan:   Problem List Items Addressed This Visit    None    Visit Diagnoses    Ventral hernia without obstruction or gangrene    -  Primary   refer to gen surgeon   Relevant Orders   Ambulatory referral to General Surgery   Flexural eczema       new Rx, explained  diagnosis   Sebaceous cyst       benign etiology       Follow up plan: Return in about 7 months (around 12/02/2018) for complete physical (on 12/02/2018 or just AFTER).  An after-visit summary was printed and given to the patient at check-out.  Please see the patient instructions which may contain other information and recommendations beyond what is mentioned above in the assessment and plan.  Meds ordered this encounter  Medications  . triamcinolone cream (KENALOG) 0.1 %    Sig: Apply 1 application topically 2 (two) times daily as needed.    Dispense:  30 g    Refill:  1  . nystatin (NYSTATIN) powder    Sig: Apply topically 3 (three) times daily as needed. To groin area    Dispense:  30 g    Refill:  2    Orders Placed This Encounter  Procedures  . Ambulatory referral to General Surgery

## 2018-04-24 NOTE — Patient Instructions (Addendum)
Please call Dr. Maximino Greenland at [phone] about your colonoscopy Use the new cream if needed for the rash Call with any issues before your next physical

## 2018-04-28 ENCOUNTER — Telehealth: Payer: Self-pay | Admitting: Gastroenterology

## 2018-04-28 NOTE — Telephone Encounter (Signed)
Patient is ready to schedule his procedure. 

## 2018-04-29 ENCOUNTER — Other Ambulatory Visit: Payer: Self-pay

## 2018-04-29 DIAGNOSIS — Z1211 Encounter for screening for malignant neoplasm of colon: Secondary | ICD-10-CM

## 2018-05-05 NOTE — Progress Notes (Signed)
05/06/2018 2:57 PM   Joseph Strong 1971/10/13 [id number]  Referring provider: Ellyn Hack, MD [address] [place], [place] [address]  Chief Complaint  Patient presents with  . Benign Prostatic Hypertrophy    HPI: 47 yo AAM with BPH with LU TS, chronic pain and dysuria who presents for a follow up.  Background history Patient is a 47 year old African American male who is referred by Dr. [name] for dysuria and chronic groin pain.  He has been having pain in his left suprapubic region for greater than one year.  8-9/10 pain.  "Pain that feels numb at the same time, it's hard to describe."   Pain comes and goes.  It can stay for one hour and a half.  Drinking fluids makes it worse.  Multi vitamin makes the pain better.   He states the pain does not cause nausea or vomiting.  He states the pain moves around his abdomen.  He states he sometime has cramping pain.  He admits to having constipation.  His UA was unremarkable.   He states he is sexually active.  He complains of a rash on his arm that occurs intermittently.     CT Renal stone study performed on 09/03/2017 noted small recurrent ventral hernia at the site of prior repair, with a small (less than 4 cubic cm) volume of herniated omental adipose tissue.  Findings in both inguinal regions suggesting prior inguinal hernia repairs. Dextroconvex thoracic scoliosis and mild levoconvex lumbar scoliosis.  Prominent stool throughout the colon favors constipation.  A specific cause for the patient's left lower abdominal/groin pain and flank pain is not identified.    Cystoscopy performed on 09/16/2017 with Dr. Berneice Heinrich was negative.    Today, his IPSS score today is 8, which is moderate lower urinary tract symptomatology.  He is mostly satisfied with his quality life due to his urinary symptoms.  His PVR is 5 mL.   His main complaints today are painful urination and nocturia.  His previous I PSS score 5/2.  His previous  PVR was 4 mL.  He is not taken finasteride or the tamsulosin in four months.  He denies any dysuria, hematuria or suprapubic pain.   He also denies any recent fevers, chills, nausea or vomiting.  He does not have a family history of PCa.   He states he is still having the discomfort in his left suprapubic region and rectal pressure.  He has an appointment for a colonoscopy.   IPSS    Row Name 05/06/18 1400         International Prostate Symptom Score   How often have you had the sensation of not emptying your bladder?  Less than 1 in 5     How often have you had to urinate less than every two hours?  Less than half the time     How often have you found you stopped and started again several times when you urinated?  Not at All     How often have you found it difficult to postpone urination?  Less than 1 in 5 times     How often have you had a weak urinary stream?  Less than 1 in 5 times     How often have you had to strain to start urination?  Less than 1 in 5 times     How many times did you typically get up at night to urinate?  2 Times  Total IPSS Score  8       Quality of Life due to urinary symptoms   If you were to spend the rest of your life with your urinary condition just the way it is now how would you feel about that?  Mostly Satisfied        Score:  1-7 Mild 8-19 Moderate 20-35 Severe  PMH: Past Medical History:  Diagnosis Date  . Burning with urination 01-10-16  . Constipation   . Dysrhythmia    "OCCASSIONAL SPEEDING UP OF HEART RATE" PT THINKING IT MAY BE DUE TO HERNIA  . Headache    MIGRAINES  . Inguinal hernia    left     Surgical History: Past Surgical History:  Procedure Laterality Date  . HERNIA REPAIR Left 1998   AND UMBILICAL  . HERNIA REPAIR     47 years of age  . INGUINAL HERNIA REPAIR Right 01/11/2016   Procedure: HERNIA REPAIR INGUINAL ADULT;  Surgeon: Kieth BrightlySeeplaputhur G Sankar, MD;  Location: [facility] ORS;  Service: General;  Laterality: Right;  .  TONSILLECTOMY     childhood    Home Medications:  Allergies as of 05/06/2018   No Known Allergies     Medication List        Accurate as of 05/06/18  2:57 PM. Always use your most recent med list.          acetaminophen 500 MG tablet Commonly known as:  TYLENOL Take 500 mg by mouth every 6 (six) hours as needed for mild pain.   multivitamin tablet Take 1 tablet by mouth daily.   nystatin powder Commonly known as:  nystatin Apply topically 3 (three) times daily as needed. To groin area   triamcinolone cream 0.1 % Commonly known as:  KENALOG Apply 1 application topically 2 (two) times daily as needed.       Allergies: No Known Allergies  Family History: Family History  Problem Relation Age of Onset  . Hypertension Maternal Grandmother   . Prostate cancer Neg Hx   . Kidney cancer Neg Hx   . Bladder Cancer Neg Hx     Social History:  reports that he has been smoking cigars and cigarettes.  He has never used smokeless tobacco. He reports that he drinks alcohol. He reports that he does not use drugs.  ROS: UROLOGY Frequent Urination?: No Hard to postpone urination?: Yes Burning/pain with urination?: Yes Get up at night to urinate?: No Leakage of urine?: No Urine stream starts and stops?: No Trouble starting stream?: No Do you have to strain to urinate?: No Blood in urine?: No Urinary tract infection?: No Sexually transmitted disease?: No Injury to kidneys or bladder?: No Painful intercourse?: No Weak stream?: No Erection problems?: No Penile pain?: No  Gastrointestinal Nausea?: Yes Vomiting?: No Indigestion/heartburn?: Yes Diarrhea?: No Constipation?: Yes  Constitutional Fever: Yes Night sweats?: No Weight loss?: No Fatigue?: No  Skin Skin rash/lesions?: Yes Itching?: No  Eyes Blurred vision?: No Double vision?: No  Ears/Nose/Throat Sore throat?: No Sinus problems?: No  Hematologic/Lymphatic Swollen glands?: No Easy bruising?:  No  Cardiovascular Leg swelling?: No Chest pain?: No  Respiratory Cough?: No Shortness of breath?: No  Endocrine Excessive thirst?: No  Musculoskeletal Back pain?: No Joint pain?: No  Neurological Headaches?: Yes Dizziness?: Yes  Psychologic Depression?: No Anxiety?: No  Physical Exam: BP 96/63 (BP Location: Right Arm, Patient Position: Sitting, Cuff Size: Normal)   Pulse 67   Ht 5\' 6"  (1.676 m)   Wt  177 lb 14.4 oz (80.7 kg)   BMI 28.71 kg/m   Constitutional: Well nourished. Alert and oriented, No acute distress. HEENT: [place] AT, moist mucus membranes. Trachea midline, no masses. Cardiovascular: No clubbing, cyanosis, or edema. Respiratory: Normal respiratory effort, no increased work of breathing. GI: Abdomen is soft, non tender, non distended, no abdominal masses. Liver and spleen not palpable.  No hernias appreciated.  Stool sample for occult testing is not indicated.   GU: No CVA tenderness.  No bladder fullness or masses.  Patient with circumcised phallus.  Urethral meatus is patent.  No penile discharge. No penile lesions or rashes. Scrotum without lesions, cysts, rashes and/or edema.  Testicles are located scrotally bilaterally. No masses are appreciated in the testicles. Left and right epididymis are normal. Rectal: Patient with  normal sphincter tone. Anus and perineum without scarring or rashes. No rectal masses are appreciated. Prostate is approximately 45 grams, no nodules are appreciated. Seminal vesicles are normal. Skin: No rashes, bruises or suspicious lesions. Lymph: No cervical or inguinal adenopathy. Neurologic: Grossly intact, no focal deficits, moving all 4 extremities. Psychiatric: Normal mood and affect.    Laboratory Data: Lab Results  Component Value Date   WBC 5.8 12/01/2017   HGB 15.4 12/01/2017   HCT 45.8 12/01/2017   MCV 82.5 12/01/2017   PLT 304 12/01/2017    Lab Results  Component Value Date   CREATININE 1.16 12/01/2017   PSA   0.8 in 08/2015 Lab Results  Component Value Date   PSA 0.7 11/05/2016  PSA 0.8 in 03/2017 - started finasteride, but he has not taken the finasteride in 4 months PSA 1.1 in 03/2018 Lab Results  Component Value Date   HGBA1C 5.4 11/07/2016     Lab Results  Component Value Date   AST 16 12/01/2017   Lab Results  Component Value Date   ALT 13 12/01/2017   I have reviewed the labs.   Pertinent Imaging: [facility]" (MRN [id number]) as of 05/06/2018 14:58  Ref. Range 05/06/2018 14:36  Scan Result Unknown 0     Assessment & Plan:    1. Chronic Groin Pain/PFD - likely genitofemoral nerve irritation from piror hernia repair. No evidence of retropritoneal, scrotal pathology or hernia recurrence. His bother is not terrible. He is reassured and will use PRN OTC meds. Also will refer to PT, but he is not certain his work schedule will allow it  2. Dysuria - no infection, neoplasia, or obstruction. Again suspect this is manifestation of anxiety spectrum problem and some early BPH symptoms. Rec PRN urinary analgesics such as AZO   3. BPH with LUTS  - IPSS score is 8/2, it is worsening   - Continue conservative management, avoiding bladder irritants and timed voiding's  - RTC in 6 months for I PSS, PSA and exam  4. Blood in stool Being evaluated by GI  Return in about 6 months (around 11/05/2018) for IPSS, PSA and exam.  These notes generated with voice recognition software. I apologize for typographical errors.  Michiel Cowboy, PA-C  [facility] [address] [place], [place] [address] [phone]

## 2018-05-06 ENCOUNTER — Ambulatory Visit (INDEPENDENT_AMBULATORY_CARE_PROVIDER_SITE_OTHER): Payer: 59 | Admitting: Urology

## 2018-05-06 ENCOUNTER — Encounter: Payer: Self-pay | Admitting: Urology

## 2018-05-06 ENCOUNTER — Encounter

## 2018-05-06 VITALS — BP 96/63 | HR 67 | Ht 66.0 in | Wt 177.9 lb

## 2018-05-06 DIAGNOSIS — N401 Enlarged prostate with lower urinary tract symptoms: Secondary | ICD-10-CM

## 2018-05-06 DIAGNOSIS — M6289 Other specified disorders of muscle: Secondary | ICD-10-CM

## 2018-05-06 DIAGNOSIS — R351 Nocturia: Secondary | ICD-10-CM

## 2018-05-06 DIAGNOSIS — N138 Other obstructive and reflux uropathy: Secondary | ICD-10-CM

## 2018-05-06 LAB — BLADDER SCAN AMB NON-IMAGING: Scan Result: 0

## 2018-05-08 ENCOUNTER — Encounter: Payer: Self-pay | Admitting: *Deleted

## 2018-05-11 ENCOUNTER — Telehealth: Payer: Self-pay | Admitting: Gastroenterology

## 2018-05-11 NOTE — Telephone Encounter (Signed)
Pt left vm to reschedule procedure for today

## 2018-05-11 NOTE — Telephone Encounter (Signed)
Returned patients call to reschedule todays colonoscopy with Dr. Maximino Greenlandahiliani.  Patient stated that he did not have any transportation.  He has been rescheduled to 05/18/18 with Tahiliani.  Boyd Kerbsenny in Endo has been informed.  Thanks [employer]

## 2018-05-15 ENCOUNTER — Encounter: Payer: Self-pay | Admitting: Student

## 2018-05-18 ENCOUNTER — Encounter: Admission: RE | Payer: Self-pay | Source: Ambulatory Visit

## 2018-05-18 ENCOUNTER — Ambulatory Visit: Admission: RE | Admit: 2018-05-18 | Payer: 59 | Source: Ambulatory Visit | Admitting: Gastroenterology

## 2018-05-18 SURGERY — COLONOSCOPY WITH PROPOFOL
Anesthesia: General

## 2018-05-20 ENCOUNTER — Ambulatory Visit: Payer: PRIVATE HEALTH INSURANCE | Admitting: Surgery

## 2018-05-26 ENCOUNTER — Ambulatory Visit: Payer: PRIVATE HEALTH INSURANCE | Admitting: Surgery

## 2018-06-22 ENCOUNTER — Encounter: Payer: Self-pay | Admitting: Surgery

## 2018-06-22 ENCOUNTER — Ambulatory Visit (INDEPENDENT_AMBULATORY_CARE_PROVIDER_SITE_OTHER): Payer: 59 | Admitting: Surgery

## 2018-06-22 VITALS — BP 128/70 | HR 76 | Temp 97.7°F | Ht 71.0 in | Wt 179.0 lb

## 2018-06-22 DIAGNOSIS — K5909 Other constipation: Secondary | ICD-10-CM

## 2018-06-22 DIAGNOSIS — R1032 Left lower quadrant pain: Secondary | ICD-10-CM | POA: Diagnosis not present

## 2018-06-22 DIAGNOSIS — K432 Incisional hernia without obstruction or gangrene: Secondary | ICD-10-CM

## 2018-06-22 DIAGNOSIS — G8929 Other chronic pain: Secondary | ICD-10-CM

## 2018-06-22 NOTE — Patient Instructions (Signed)
We would like for you to have your Colonoscopy. Please call Dr.Vangas office to get this scheduled.   Please take Miralax  twice a week to help with constipation. Please take Colace twice daily.

## 2018-06-22 NOTE — Progress Notes (Signed)
Joseph Strong is an 47 y.o. male.   Chief Complaint: Recurrent ventral hernia Consult requested by Dr. Sherie DonLada HPI: This patient with a recurrent ventral hernia.  He has had a prior repair and recent CT scan shows obvious herniation near the repair. CC is left groin pain.  He describes occasional left groin pain.  He also states he has an umbilical hernia recurrence from where it was repaired as a baby.  This is not congruent with findings on CT scan where mesh is obvious with the present and/or metallic suture like material. He is frequently constipated. None of the pain associated with any of this in his abdomen is keeping him from exercising etc.  He never points to the periumbilical area as a source of pain always in the left groin.  States it is in his left back as well.  Past Medical History:  Diagnosis Date  . Burning with urination 01-10-16  . Constipation   . Dysrhythmia    "OCCASSIONAL SPEEDING UP OF HEART RATE" PT THINKING IT MAY BE DUE TO HERNIA  . Headache    MIGRAINES  . Inguinal hernia    left     Past Surgical History:  Procedure Laterality Date  . HERNIA REPAIR Left 1998   AND UMBILICAL  . HERNIA REPAIR     47 years of age  . INGUINAL HERNIA REPAIR Right 01/11/2016   Procedure: HERNIA REPAIR INGUINAL ADULT;  Surgeon: Kieth BrightlySeeplaputhur G Sankar, MD;  Location: [facility] ORS;  Service: General;  Laterality: Right;  . TONSILLECTOMY     childhood    Family History  Problem Relation Age of Onset  . Hypertension Maternal Grandmother   . Prostate cancer Neg Hx   . Kidney cancer Neg Hx   . Bladder Cancer Neg Hx    Social History:  reports that he has been smoking cigars and cigarettes.  He has never used smokeless tobacco. He reports that he drinks alcohol. He reports that he does not use drugs.  Allergies: No Known Allergies   (Not in a hospital admission)   Review of Systems:   Review of Systems  Constitutional: Negative.   HENT: Negative.   Eyes: Negative.    Respiratory: Negative.   Cardiovascular: Negative.   Gastrointestinal: Positive for abdominal pain.  Genitourinary: Negative.   Musculoskeletal: Negative.   Skin: Negative.   Neurological: Negative.   Endo/Heme/Allergies: Negative.   Psychiatric/Behavioral: Negative.     Physical Exam:  Physical Exam  Constitutional: He is oriented to person, place, and time. He appears well-developed and well-nourished. No distress.  HENT:  Head: Normocephalic and atraumatic.  Eyes: Pupils are equal, round, and reactive to light. Right eye exhibits no discharge. Left eye exhibits no discharge. No scleral icterus.  Neck: Normal range of motion.  Cardiovascular: Normal rate, regular rhythm and normal heart sounds.  Pulmonary/Chest: Effort normal and breath sounds normal. No stridor. No respiratory distress.  Abdominal: Soft. He exhibits no distension. There is no tenderness. There is no guarding. A hernia is present.  Vaguely palpable area of fullness near the umbilicus.  Infraumbilical scar is noted and possibly a supraumbilical scar but not obviously clear.  This area is examined supine and in the sit up position as well as standing it is not clear if there is actually a hernia there.  Genitourinary: Penis normal.  Genitourinary Comments: Patient examined standing supine and with Valsalva.  No sign of recurrent right inguinal hernia.  On the left there is a very small recurrence  with a well-healed scar.  Ankles are normal and nontender  Musculoskeletal: Normal range of motion. He exhibits no edema.  Lymphadenopathy:    He has no cervical adenopathy.  Neurological: He is alert and oriented to person, place, and time.  Skin: Skin is warm and dry. He is not diaphoretic. No erythema.  Vitals reviewed.   There were no vitals taken for this visit.    No results found for this or any previous visit (from the past 48 hour(s)). No results found.   Assessment/Plan This patient with a recurrent  ventral hernia.  It appears to be quite small on personal review of the CT scan.  He has a tremendous stool burden also noted on CT scan. Patient has had at least 2 colonoscopies canceled for unclear reasons. Patient had a right inguinal hernia repaired by Dr. Orbie Hurst in 2017.  He had the left side repaired many years ago.  He had an umbilical hernia repaired as a baby. Regardless I see no obvious signs of significant recurrent hernia either in the umbilical area or in the left groin to identify the source of his pain.  I would like to reexamine him at some point.  I think his larger problem is that of constipation and I discussed with him a bowel regimen and I have also suggested that we try to rearrange another colonoscopy attempt.  He has no family here in town and has trouble getting a ride home but we can certainly help rearrange another colonoscopy attempt if possible. [name], MD, FACS

## 2018-06-24 ENCOUNTER — Encounter: Payer: Self-pay | Admitting: Gastroenterology

## 2018-07-14 ENCOUNTER — Ambulatory Visit: Payer: Self-pay | Admitting: Surgery

## 2018-07-15 ENCOUNTER — Telehealth: Payer: Self-pay

## 2018-07-15 NOTE — Telephone Encounter (Signed)
Mailed letter asking patient to call office to reschedule his appointment with Dr.Cooper due to not having Colonoscopy done.

## 2018-07-28 ENCOUNTER — Ambulatory Visit: Payer: Self-pay | Admitting: Surgery

## 2018-09-12 DIAGNOSIS — Z23 Encounter for immunization: Secondary | ICD-10-CM | POA: Diagnosis not present

## 2018-10-09 ENCOUNTER — Ambulatory Visit (INDEPENDENT_AMBULATORY_CARE_PROVIDER_SITE_OTHER): Payer: 59 | Admitting: Family Medicine

## 2018-10-09 ENCOUNTER — Encounter: Payer: Self-pay | Admitting: Family Medicine

## 2018-10-09 VITALS — BP 118/72 | HR 82 | Temp 97.3°F | Ht 71.0 in | Wt 182.0 lb

## 2018-10-09 DIAGNOSIS — K5900 Constipation, unspecified: Secondary | ICD-10-CM

## 2018-10-09 DIAGNOSIS — Z748 Other problems related to care provider dependency: Secondary | ICD-10-CM | POA: Diagnosis not present

## 2018-10-09 DIAGNOSIS — Q453 Other congenital malformations of pancreas and pancreatic duct: Secondary | ICD-10-CM

## 2018-10-09 DIAGNOSIS — K439 Ventral hernia without obstruction or gangrene: Secondary | ICD-10-CM

## 2018-10-09 DIAGNOSIS — R1032 Left lower quadrant pain: Secondary | ICD-10-CM | POA: Diagnosis not present

## 2018-10-09 DIAGNOSIS — G8929 Other chronic pain: Secondary | ICD-10-CM

## 2018-10-09 DIAGNOSIS — R109 Unspecified abdominal pain: Secondary | ICD-10-CM

## 2018-10-09 LAB — POCT URINALYSIS DIPSTICK
Bilirubin, UA: NEGATIVE
Blood, UA: NEGATIVE
Glucose, UA: NEGATIVE
Ketones, UA: NEGATIVE
Leukocytes, UA: NEGATIVE
Nitrite, UA: NEGATIVE
Odor: NORMAL
Protein, UA: NEGATIVE
Spec Grav, UA: 1.015 (ref 1.010–1.025)
Urobilinogen, UA: 0.2 E.U./dL
pH, UA: 5.5 (ref 5.0–8.0)

## 2018-10-09 NOTE — Assessment & Plan Note (Signed)
Patient to get colonoscopy and then see surgeon

## 2018-10-09 NOTE — Progress Notes (Signed)
BP 118/72   Pulse 82   Temp (!) 97.3 F (36.3 C)   Ht 5\' 11"  (1.803 m)   Wt 182 lb (82.6 kg)   SpO2 99%   BMI 25.38 kg/m    Subjective:    Patient ID: Joseph Strong, male    DOB: 03/24/1971, 47 y.o.   MRN: [id number]  HPI: Joseph Strong is a 47 y.o. male  Chief Complaint  Patient presents with  . Follow-up    HPI  Patient is here for a "follow-up" He has not had a colonscopy yet because transportation is an issue He went to [facility], but they want him to have the colonoscopy before the repair of the ventral hernia Still having pain there; lower abdomen down into the groin and left scrotum and upper abdomen LUQ and through to the back under the ribs; feels a little dizzy; discomfort comes and goes; has mesh in there; reviewed surgeon's note which documents the left groin and left back He has had imaging; CT scan renal stone study September 04, 2017; MRI abd Feb 2018 which showed pancreas divisum but no explanation for left-sided pain at that time; another MRI of pelvis Feb 2018 (separate day) also did not show any evidence of cause for bilateral groin pain, no recurrent inguinal hernia; he had scrotal US August 2017, small right hydrocele, normal testes bilaterally  Seeing urologist for his prostate; enlarged prostate; has been on medicine for this in the past  CT scan renal stone study done September 04, 2017: IMPRESSION: 1. Small recurrent ventral hernia at the site of prior repair, with a small (less than 4 cubic cm) volume of herniated omental adipose tissue. 2. Findings in both inguinal regions suggesting prior inguinal hernia repairs. 3. Dextroconvex thoracic scoliosis and mild levoconvex lumbar scoliosis. 4.  Prominent stool throughout the colon favors constipation. 5. A specific cause for the patient's left lower abdominal/groin pain and flank pain is not identified .   Electronically Signed   By: Gaylyn Rong M.D.   On: 09/04/2017 08:39  March  2015 scan reviewed and that right sided hernia was repaired; he had a rash on the skin a day or two after the surgery over the site right side; had similar rash a few months ago, about 3 months ago Nov 2016 [place] reviewed CT scan Nov 2016 reviewed  The left side was 1998, Dr. Zachery Dauer in [place] [facility]; now called [facility]; fabric on that side  Last BM was today; no blood in the stool; just saw some blood as he was wiping when wiping; no blood in the stool; stool is deep brown  Depression screen [facility] 2/9 10/09/2018 04/24/2018 12/01/2017 03/18/2017 12/20/2016  Decreased Interest 0 0 0 0 0  Down, Depressed, Hopeless 0 0 0 0 0  PHQ - 2 Score 0 0 0 0 0  Altered sleeping 0 - - - -  Tired, decreased energy 0 - - - -  Change in appetite 0 - - - -  Feeling bad or failure about yourself  0 - - - -  Trouble concentrating 0 - - - -  Moving slowly or fidgety/restless 0 - - - -  Suicidal thoughts 0 - - - -  PHQ-9 Score 0 - - - -   Fall Risk  10/09/2018 04/24/2018 12/01/2017 03/18/2017 12/20/2016  Falls in the past year? 0 No No No No    Relevant past medical, surgical, family and social history reviewed Past Medical History:  Diagnosis Date  . Burning with urination 01-10-16  . Constipation   . Dysrhythmia    "OCCASSIONAL SPEEDING UP OF HEART RATE" PT THINKING IT MAY BE DUE TO HERNIA  . Headache    MIGRAINES  . Inguinal hernia    left    Past Surgical History:  Procedure Laterality Date  . HERNIA REPAIR Left 1998   AND UMBILICAL  . HERNIA REPAIR     47 years of age  . INGUINAL HERNIA REPAIR Right 01/11/2016   Procedure: HERNIA REPAIR INGUINAL ADULT;  Surgeon: Kieth BrightlySeeplaputhur G Sankar, MD;  Location: [facility] ORS;  Service: General;  Laterality: Right;  . TONSILLECTOMY     childhood   Family History  Problem Relation Age of Onset  . Hypertension Maternal Grandmother   . Prostate cancer Neg Hx   . Kidney cancer Neg Hx   . Bladder Cancer Neg Hx    Social History   Tobacco Use  . Smoking  status: Light Tobacco Smoker    Types: Cigars, Cigarettes  . Smokeless tobacco: Never Used  Substance Use Topics  . Alcohol use: Yes    Alcohol/week: 0.0 standard drinks    Comment: occasional  . Drug use: No     [facility]  AUDIT-C Score  1      Interim medical history since last visit reviewed. Allergies and medications reviewed  Review of Systems Per HPI unless specifically indicated above     Objective:    BP 118/72   Pulse 82   Temp (!) 97.3 F (36.3 C)   Ht 5\' 11"  (1.803 m)   Wt 182 lb (82.6 kg)   SpO2 99%   BMI 25.38 kg/m   Wt Readings from Last 3 Encounters:  10/09/18 182 lb (82.6 kg)  06/22/18 179 lb (81.2 kg)  05/06/18 177 lb 14.4 oz (80.7 kg)    Physical Exam  Constitutional: He appears well-developed and well-nourished. No distress.  HENT:  Head: Normocephalic and atraumatic.  Eyes: EOM are normal. No scleral icterus.  Neck: No thyromegaly present.  Cardiovascular: Normal rate and regular rhythm.  Pulmonary/Chest: Effort normal and breath sounds normal.  Abdominal: Soft. Bowel sounds are normal. He exhibits no distension. Hernia confirmed negative in the right inguinal area and confirmed negative in the left inguinal area.    Surgical scar infraumbilical is without erythema or fluctance or tenderness; there is mild nontender prominence to the RIGHT of the umbilcus with fullness, but not sure if positional due to his scoliosis; surgical scar left inguinal region without erythema or fluctuance  Genitourinary: Penis normal. Right testis shows no mass, no swelling and no tenderness. Right testis is descended. Left testis shows no mass, no swelling and no tenderness. Left testis is descended. No paraphimosis.  Musculoskeletal: He exhibits no edema.  Lymphadenopathy: No inguinal adenopathy noted on the right or left side.       Right: No inguinal adenopathy present.       Left: No inguinal adenopathy  present.  Neurological: Coordination normal.  Skin: Skin is warm and dry. No pallor.     Subcutaneous sebeceous cysts versus small lipomas, consistency is more likely sebaceous cysts  Psychiatric: He has a normal mood and affect. His behavior is normal. Judgment and thought content normal.    Results for orders placed or performed in visit on 10/09/18  POCT Urinalysis Dipstick  Result Value Ref Range   Color, UA lt yellow    Clarity, UA  clear    Glucose, UA Negative Negative   Bilirubin, UA neg    Ketones, UA neg    Spec Grav, UA 1.015 1.010 - 1.025   Blood, UA neg    pH, UA 5.5 5.0 - 8.0   Protein, UA Negative Negative   Urobilinogen, UA 0.2 0.2 or 1.0 E.U./dL   Nitrite, UA neg    Leukocytes, UA Negative Negative   Appearance clear    Odor normal       Assessment & Plan:   Problem List Items Addressed This Visit      Digestive   Pancreas divisum    Noted on prior imaging; not sure if this is related to his chronic abdominal pain, as the location is further away from site; however, may increase risk of pancreatitis; reasons to go to the ER reviewed        Other   Ventral hernia without obstruction or gangrene - Primary    Patient to get colonoscopy and then see surgeon      Constipation    Encouraged adequate fluid intake; get the colonoscopy ASAP; referral put in to get help with transportation; see if anything found on colonoscopy; if not, then can proceed to seeing surgeon again for the abdominal pain and hernia      Chronic pain of left groin    Ongoing issue; patient will get colonoscopy soon and then go bback to see the surgeon; reviewed multiple tests done since 2015       Other Visit Diagnoses    Inguinal pain, left       Relevant Orders   POCT Urinalysis Dipstick (Completed)   Assistance needed with transportation       Relevant Orders   Ambulatory referral to [facility]   Abdominal pain, unspecified abdominal location       patient has had  numerous imaging tests, seen GI, gen surg, and urology; I am not going to order any add'l imaging today, but get him back to GI and surg       Follow up plan: No follow-ups on file.  An after-visit summary was printed and given to the patient at check-out.  Please see the patient instructions which may contain other information and recommendations beyond what is mentioned above in the assessment and plan.  No orders of the defined types were placed in this encounter.   Orders Placed This Encounter  Procedures  . Ambulatory referral to [facility]  . POCT Urinalysis Dipstick

## 2018-10-09 NOTE — Patient Instructions (Addendum)
These are signs and symptoms of pancreatitis (below) and reasons to go to the ER  Our next step is to have you get the colonoscopy  Someone should call you to help set up rides for that  If your pain worsens, please go to the ER  See the surgeon after your colonoscopy  Drink more water, 64 ounces of water a day  I'll see you back on or after December 02, 2018  Start Miralax (over-the-counter) to help keep stools moving  Please call Dr. Michele Mcalpineahiliani's office to schedule your colonoscopy at Phone: [phone]  Acute Pancreatitis Acute pancreatitis is a condition in which the pancreas suddenly gets irritated and swollen (has inflammation). The pancreas is a large gland behind the stomach. It makes enzymes that help to digest food. The pancreas also makes hormones that help to control your blood sugar. Acute pancreatitis happens when the enzymes attack the pancreas and damage it. Most attacks last a couple of days and can cause serious problems. Follow these instructions at home: Eating and drinking  Follow instructions from your doctor about diet. You may need to: ? Avoid alcohol. ? Limit how much fat is in your diet.  Eat small meals often. Avoid eating big meals.  Drink enough fluid to keep your pee (urine) clear or pale yellow.  Do not drink alcohol if it caused your condition. General instructions  Take over-the-counter and prescription medicines only as told by your doctor.  Do not use any tobacco products. These include cigarettes, chewing tobacco, and e-cigarettes. If you need help quitting, ask your doctor.  Get plenty of rest.  If directed, check your blood sugar at home as told by your doctor.  Keep all follow-up visits as told by your doctor. This is important. Contact a doctor if:  You do not get better as quickly as expected.  You have new symptoms.  Your symptoms get worse.  You have lasting pain or weakness.  You continue to feel sick to your stomach  (nauseous).  You get better and then you have another pain attack.  You have a fever. Get help right away if:  You cannot eat or keep fluids down.  Your pain becomes very bad.  Your skin or the white part of your eyes turns yellow (jaundice).  You throw up (vomit).  You feel dizzy or you pass out (faint).  Your blood sugar is high (over 300 mg/dL). This information is not intended to replace advice given to you by your health care provider. Make sure you discuss any questions you have with your health care provider. Document Released: 04/29/2008 Document Revised: 04/18/2016 Document Reviewed: 08/15/2015 Elsevier Interactive Patient Education  2018 [employer]

## 2018-10-09 NOTE — Assessment & Plan Note (Signed)
Ongoing issue; patient will get colonoscopy soon and then go bback to see the surgeon; reviewed multiple tests done since 2015

## 2018-10-09 NOTE — Assessment & Plan Note (Signed)
Encouraged adequate fluid intake; get the colonoscopy ASAP; referral put in to get help with transportation; see if anything found on colonoscopy; if not, then can proceed to seeing surgeon again for the abdominal pain and hernia

## 2018-10-09 NOTE — Assessment & Plan Note (Signed)
Noted on prior imaging; not sure if this is related to his chronic abdominal pain, as the location is further away from site; however, may increase risk of pancreatitis; reasons to go to the ER reviewed

## 2018-11-04 ENCOUNTER — Other Ambulatory Visit: Payer: Self-pay

## 2018-11-04 DIAGNOSIS — N138 Other obstructive and reflux uropathy: Secondary | ICD-10-CM

## 2018-11-04 DIAGNOSIS — N401 Enlarged prostate with lower urinary tract symptoms: Principal | ICD-10-CM

## 2018-11-05 ENCOUNTER — Other Ambulatory Visit: Payer: 59

## 2018-11-05 ENCOUNTER — Encounter: Payer: Self-pay | Admitting: Urology

## 2018-11-12 ENCOUNTER — Ambulatory Visit: Payer: 59 | Admitting: Urology

## 2018-11-26 ENCOUNTER — Ambulatory Visit (INDEPENDENT_AMBULATORY_CARE_PROVIDER_SITE_OTHER): Payer: 59 | Admitting: Family Medicine

## 2018-11-26 ENCOUNTER — Encounter: Payer: Self-pay | Admitting: Family Medicine

## 2018-11-27 ENCOUNTER — Encounter: Payer: 59 | Admitting: Family Medicine

## 2018-12-02 NOTE — Progress Notes (Signed)
Patient not seen.

## 2018-12-10 ENCOUNTER — Encounter: Payer: 59 | Admitting: Family Medicine

## 2018-12-10 ENCOUNTER — Encounter: Payer: Self-pay | Admitting: Nurse Practitioner

## 2018-12-10 ENCOUNTER — Ambulatory Visit (INDEPENDENT_AMBULATORY_CARE_PROVIDER_SITE_OTHER): Payer: 59 | Admitting: Nurse Practitioner

## 2018-12-10 ENCOUNTER — Other Ambulatory Visit (HOSPITAL_COMMUNITY)
Admission: RE | Admit: 2018-12-10 | Discharge: 2018-12-10 | Disposition: A | Discharge status: Home / Self Care | Payer: 59 | Source: Ambulatory Visit | Attending: Family Medicine | Admitting: Family Medicine

## 2018-12-10 ENCOUNTER — Telehealth: Payer: Self-pay

## 2018-12-10 VITALS — BP 122/76 | HR 75 | Temp 97.7°F | Resp 12 | Ht 71.0 in | Wt 178.7 lb

## 2018-12-10 DIAGNOSIS — Z113 Encounter for screening for infections with a predominantly sexual mode of transmission: Secondary | ICD-10-CM | POA: Diagnosis present

## 2018-12-10 DIAGNOSIS — Z13220 Encounter for screening for lipoid disorders: Secondary | ICD-10-CM | POA: Diagnosis not present

## 2018-12-10 DIAGNOSIS — Z0000 Encounter for general adult medical examination without abnormal findings: Secondary | ICD-10-CM | POA: Diagnosis not present

## 2018-12-10 DIAGNOSIS — Z1211 Encounter for screening for malignant neoplasm of colon: Secondary | ICD-10-CM

## 2018-12-10 DIAGNOSIS — Z131 Encounter for screening for diabetes mellitus: Secondary | ICD-10-CM

## 2018-12-10 DIAGNOSIS — Z130 Encounter for screening for diseases of the blood and blood-forming organs and certain disorders involving the immune mechanism: Secondary | ICD-10-CM

## 2018-12-10 NOTE — Patient Instructions (Addendum)
General recommendations: 150 minutes of physical activity weekly, eat two servings of fish weekly, eat one serving of tree nuts ( cashews, pistachios, pecans, almonds.Marland Kitchen) every other day, eat 6 servings of fruit/vegetables daily and drink plenty of water- at least 64 ounces of water and avoid sweet beverages.   - Eat at least one fruit and vegetable a day - Drink at least 6 glasses of water a day - Follow up with urology.

## 2018-12-10 NOTE — Progress Notes (Signed)
Name: Joseph Strong   MRN: [id number]    DOB: 04/27/1971   Date:12/10/2018       Progress Note  Subjective  Chief Complaint  Chief Complaint  Patient presents with  . Annual Exam    std check     HPI  Patient presents for annual CPE.  USPSTF grade A and B recommendations:  Diet:  Eats a lot of chicken and [place], occasionally eats pork and ham eats beef rarely Eats a lot of bread and rice Eats cabbage, corn, sweet peas- has at least 3 times a week Hardly eats fruits.  Drinks a 3-4 glasses of water a day, drinks a lot of vitamin teas  Exercise:  Goes to the gym 1-2 hours 4 times a week.  Lots of lifting a work as well.   Depression:  [facility] 12/10/2018 10/09/2018 04/24/2018 12/01/2017 03/18/2017  Decreased Interest 0 0 0 0 0  Down, Depressed, Hopeless 0 0 0 0 0  PHQ - 2 Score 0 0 0 0 0  Altered sleeping 0 0 - - -  Tired, decreased energy 0 0 - - -  Change in appetite 0 0 - - -  Feeling bad or failure about yourself  0 0 - - -  Trouble concentrating 0 0 - - -  Moving slowly or fidgety/restless 0 0 - - -  Suicidal thoughts 0 0 - - -  PHQ-9 Score 0 0 - - -  Difficult doing work/chores Not difficult at all - - - -    Hypertension:  BP Readings from Last 3 Encounters:  12/10/18 122/76  11/26/18 120/78  10/09/18 118/72    Obesity: Wt Readings from Last 3 Encounters:  12/10/18 178 lb 11.2 oz (81.1 kg)  11/26/18 182 lb (82.6 kg)  10/09/18 182 lb (82.6 kg)   BMI Readings from Last 3 Encounters:  12/10/18 24.92 kg/m  11/26/18 25.75 kg/m  10/09/18 25.38 kg/m     Lipids:  Lab Results  Component Value Date   CHOL 169 12/01/2017   CHOL 161 11/05/2016   CHOL 163 09/20/2015   Lab Results  Component Value Date   HDL 67 12/01/2017   HDL 69 11/05/2016   HDL 66 09/20/2015   Lab Results  Component Value Date   LDLCALC 88 12/01/2017   LDLCALC 84 11/05/2016   LDLCALC 91 09/20/2015   Lab Results  Component Value Date   TRIG 46 12/01/2017    TRIG 38 11/05/2016   TRIG 29 09/20/2015   Lab Results  Component Value Date   CHOLHDL 2.5 12/01/2017   CHOLHDL 2.3 11/05/2016   CHOLHDL 2.5 09/20/2015   No results found for: LDLDIRECT Glucose:  Glucose  Date Value Ref Range Status  07/10/2017 81 65 - 99 mg/dL Final    Comment:    Specimen received in contact with cells. No visible hemolysis present. However GLUC may be decreased and K increased. Clinical correlation indicated.   09/30/2014 92 65 - 99 mg/dL Final  40/98/119103/31/2015 83 65 - 99 mg/dL Final  47/82/956204/21/2014 56 (L) 65 - 99 mg/dL Final   Glucose, Bld  Date Value Ref Range Status  12/01/2017 87 65 - 99 mg/dL Final    Comment:    .            Fasting reference interval .   12/20/2016 88 65 - 99 mg/dL Final  13/08/657812/10/2016 79 65 - 99 mg/dL Final      [facility]  AUDIT-C Score  1      Single STD testing and prevention (HIV/chl/gon/syphilis): he is sexually active, does not use protection- monogmous relationship no discharge Hep C: will check today  Skin cancer: denies, discussed with him  Colorectal cancer: send referral to GI for colonoscopy.  Prostate cancer: follows up with urology  Lab Results  Component Value Date   PSA 0.7 11/05/2016     Lung cancer:  Low Dose CT Chest recommended if Age 40-80 years, 30 pack-year currently smoking OR have quit w/in 15years. Patient does not qualify.  Smokes cigars maybe one a day .  Advanced Care Planning: A voluntary discussion about advance care planning including the explanation and discussion of advance directives.  Discussed health care proxy and Living will, and the patient was able to identify a health care proxy as Joseph Strong [phone] (grandmother) .  Patient does not have a living will at present time. If patient does have living will, I have requested they bring this to the clinic to be scanned in to their chart.  Patient Active Problem List   Diagnosis Date  Noted  . Pancreas divisum 10/09/2018  . Constipation 10/09/2018  . Ventral hernia without obstruction or gangrene 10/09/2018  . Prostate cancer screening 09/16/2017  . Abdominal pain, periumbilical 12/20/2016  . Decreased frequency of bowel movements 01/10/2016  . Left paraspinal back pain 12/08/2015  . Dizziness, nonspecific 10/31/2015  . Abnormal TSH 10/31/2015  . Chronic pain of left groin 10/31/2015  . Abnormal EKG 10/31/2015  . Testicular pain, left 09/20/2015  . S/P unilateral inguinal hernia repair 09/20/2015    Past Surgical History:  Procedure Laterality Date  . HERNIA REPAIR Left 1998   AND UMBILICAL  . HERNIA REPAIR     48 years of age  . INGUINAL HERNIA REPAIR Right 01/11/2016   Procedure: HERNIA REPAIR INGUINAL ADULT;  Surgeon: Kieth Brightly, MD;  Location: [facility] ORS;  Service: General;  Laterality: Right;  . TONSILLECTOMY     childhood    Family History  Problem Relation Age of Onset  . Hypertension Maternal Grandmother   . Prostate cancer Neg Hx   . Kidney cancer Neg Hx   . Bladder Cancer Neg Hx     Social History   Socioeconomic History  . Marital status: Single    Spouse name: Not on file  . Number of children: 1  . Years of education: 74  . Highest education level: 11th grade  Occupational History  . Not on file  Social Needs  . Financial resource strain: Not hard at all  . Food insecurity:    Worry: Never true    Inability: Never true  . Transportation needs:    Medical: No    Non-medical: No  Tobacco Use  . Smoking status: Light Tobacco Smoker    Types: Cigars, Cigarettes  . Smokeless tobacco: Never Used  Substance and Sexual Activity  . Alcohol use: Yes    Alcohol/week: 0.0 standard drinks    Comment: occasional  . Drug use: No  . Sexual activity: Yes  Lifestyle  . Physical activity:    Days per week: 3 days    Minutes per session: 90 min  . Stress: Not at all  Relationships  . Social connections:    Talks on phone:  Once a week    Gets together: Never    Attends religious service: Never    Active member of club or organization: No  Attends meetings of clubs or organizations: Never    Relationship status: Never married  . Intimate partner violence:    Fear of current or ex partner: No    Emotionally abused: No    Physically abused: No    Forced sexual activity: No  Other Topics Concern  . Not on file  Social History Narrative  . Not on file     Current Outpatient Medications:  .  acetaminophen (TYLENOL) 500 MG tablet, Take 500 mg by mouth every 6 (six) hours as needed for mild pain., Disp: , Rfl:  .  Multiple Vitamin (MULTIVITAMIN) tablet, Take 1 tablet by mouth daily., Disp: , Rfl:  .  nystatin (NYSTATIN) powder, Apply topically 3 (three) times daily as needed. To groin area (Patient not taking: Reported on 05/06/2018), Disp: 30 g, Rfl: 2 .  triamcinolone cream (KENALOG) 0.1 %, Apply 1 application topically 2 (two) times daily as needed. (Patient not taking: Reported on 05/06/2018), Disp: 30 g, Rfl: 1  No Known Allergies   Review of Systems  Constitutional: Negative for chills and fever.  HENT: Negative for congestion and tinnitus.   Eyes: Negative for blurred vision and double vision.  Respiratory: Negative for cough and shortness of breath.   Cardiovascular: Negative for chest pain and palpitations.  Gastrointestinal: Positive for constipation (intermittent blood when wiping- has hemrrhoids). Negative for abdominal pain, blood in stool (mild), heartburn, nausea and vomiting.  Genitourinary: Negative for dysuria and hematuria.  Musculoskeletal: Negative for back pain and myalgias.  Skin: Positive for rash (dark pigmented skin in left inner elbow comes and goes the past few years- sees derm). Negative for itching.  Neurological: Negative for dizziness, tingling, focal weakness and headaches.  Endo/Heme/Allergies: Negative for polydipsia.  Psychiatric/Behavioral: Negative for depression.  The patient is not nervous/anxious.       Objective  Vitals:   12/10/18 0917  BP: 122/76  Pulse: 75  Resp: 12  Temp: 97.7 F (36.5 C)  TempSrc: Other (Comment)  SpO2: 98%  Weight: 178 lb 11.2 oz (81.1 kg)  Height: 5\' 11"  (1.803 m)    Body mass index is 24.92 kg/m.  Physical Exam  Constitutional: Patient appears well-developed and well-nourished. No distress.  HENT: Head: Normocephalic and atraumatic. Ears: B TMs ok, no erythema or effusion; Nose: Nose normal. Mouth/Throat: Oropharynx is clear and moist. No oropharyngeal exudate.  Eyes: Conjunctivae and EOM are normal. Pupils are equal, round, and reactive to light. No scleral icterus.  Neck: Normal range of motion. Neck supple-thick. No JVD present. No thyromegaly present.  Cardiovascular: Normal rate, regular rhythm and normal heart sounds.  No murmur heard. No BLE edema. Pulmonary/Chest: Effort normal and breath sounds normal. No respiratory distress. Abdominal: Soft. Bowel sounds are normal, no distension. There is no tenderness. hernia palpated- follow up with surgeon MALE GENITALIA: Normal descended testes, no lesions, no discharge, endorses mild intermittent left testicular pain- nontender not rash, no pain presently- follow up with urology next week.  RECTAL: deferred Musculoskeletal: Normal range of motion, no joint effusions. No gross deformities Neurological: he is alert and oriented to person, place, and time. No cranial nerve deficit. Coordination, balance, strength, speech and gait are normal.  Skin: Skin is warm and dry. Area of darker pigmentation in left elbow crease- no itching, pain or warmth. No erythema.  Psychiatric: Patient has a normal mood and affect. behavior is normal. Judgment and thought content normal.    Fall Risk: Fall Risk  12/10/2018 10/09/2018 04/24/2018 12/01/2017 03/18/2017  Falls  in the past year? 0 0 No No No  Number falls in past yr: 0 - - - -  Injury with Fall? 0 - - - -      Functional Status Survey: Is the patient deaf or have difficulty hearing?: Yes Does the patient have difficulty seeing, even when wearing glasses/contacts?: No Does the patient have difficulty concentrating, remembering, or making decisions?: No Does the patient have difficulty walking or climbing stairs?: No Does the patient have difficulty dressing or bathing?: No Does the patient have difficulty doing errands alone such as visiting a doctor's office or shopping?: No    Assessment & Plan  1. Annual physical exam Discussed proper diet and hydration, discussed screening tests recommended. Follow up with surgeon for hernia, follow up with urology for mild testicular pain and PSA next week- go to ER with severe testicular pains- and signs of torsion, follow up with GI for colonoscopy. Follow up with derm for rash on left elbow.  - COMPLETE METABOLIC PANEL WITH GFR - Lipid Profile  2. Screening for colon cancer - Ambulatory referral to Gastroenterology  3. Screening for cholesterol level - Lipid Profile  4. Screening for diabetes mellitus - COMPLETE METABOLIC PANEL WITH GFR  5. Screening for deficiency anemia - CBC With Differential  6. Screening for STD (sexually transmitted disease - HIV antibody (with reflex) - RPR - Hepatitis panel, acute - Urine cytology ancillary only    -Prostate cancer screening and PSA options (with potential risks and benefits of testing vs not testing) were discussed along with recent recs/guidelines. -USPSTF grade A and B recommendations reviewed with patient; age-appropriate recommendations, preventive care, screening tests, etc discussed and encouraged; healthy living encouraged; see AVS for patient education given to patient -Discussed importance of 150 minutes of physical activity weekly, eat two servings of fish weekly, eat one serving of tree nuts ( cashews, pistachios, pecans, almonds.Marland Kitchen) every other day, eat 6 servings of  fruit/vegetables daily and drink plenty of water and avoid sweet beverages.

## 2018-12-10 NOTE — Telephone Encounter (Signed)
Contacted patient to schedule his colonoscopy.  He will call me back.  He plans on discussing dates with his girlfriend to make sure he will have someone to accompany him to have it.  Thanks [employer]

## 2018-12-11 LAB — CBC WITH DIFFERENTIAL/PLATELET
Absolute Monocytes: 636 cells/uL (ref 200–950)
Basophils Absolute: 32 cells/uL (ref 0–200)
Basophils Relative: 0.8 %
Eosinophils Absolute: 152 cells/uL (ref 15–500)
Eosinophils Relative: 3.8 %
HCT: 48.2 % (ref 38.5–50.0)
Hemoglobin: 16.2 g/dL (ref 13.2–17.1)
Lymphs Abs: 1332 cells/uL (ref 850–3900)
MCH: 28.7 pg (ref 27.0–33.0)
MCHC: 33.6 g/dL (ref 32.0–36.0)
MCV: 85.3 fL (ref 80.0–100.0)
MPV: 10.4 fL (ref 7.5–12.5)
Monocytes Relative: 15.9 %
Neutro Abs: 1848 cells/uL (ref 1500–7800)
Neutrophils Relative %: 46.2 %
Platelets: 301 10*3/uL (ref 140–400)
RBC: 5.65 10*6/uL (ref 4.20–5.80)
RDW: 12.6 % (ref 11.0–15.0)
Total Lymphocyte: 33.3 %
WBC: 4 10*3/uL (ref 3.8–10.8)

## 2018-12-11 LAB — RPR: RPR Ser Ql: NONREACTIVE

## 2018-12-11 LAB — COMPLETE METABOLIC PANEL WITH GFR
AG Ratio: 1.4 (calc) (ref 1.0–2.5)
ALT: 15 U/L (ref 9–46)
AST: 15 U/L (ref 10–40)
Albumin: 4 g/dL (ref 3.6–5.1)
Alkaline phosphatase (APISO): 50 U/L (ref 40–115)
BUN/Creatinine Ratio: 12 (calc) (ref 6–22)
BUN: 16 mg/dL (ref 7–25)
CO2: 24 mmol/L (ref 20–32)
Calcium: 9.5 mg/dL (ref 8.6–10.3)
Chloride: 107 mmol/L (ref 98–110)
Creat: 1.38 mg/dL — ABNORMAL HIGH (ref 0.60–1.35)
GFR, Est African American: 70 mL/min/{1.73_m2} (ref >=60)
GFR, Est Non African American: 60 mL/min/{1.73_m2} (ref >=60)
Globulin: 2.8 g/dL (calc) (ref 1.9–3.7)
Glucose, Bld: 74 mg/dL (ref 65–99)
Potassium: 4.8 mmol/L (ref 3.5–5.3)
Sodium: 138 mmol/L (ref 135–146)
Total Bilirubin: 0.7 mg/dL (ref 0.2–1.2)
Total Protein: 6.8 g/dL (ref 6.1–8.1)

## 2018-12-11 LAB — LIPID PANEL
Cholesterol: 167 mg/dL (ref <200)
HDL: 57 mg/dL (ref >40)
LDL Cholesterol (Calc): 95 mg/dL (calc)
Non-HDL Cholesterol (Calc): 110 mg/dL (calc) (ref <130)
Total CHOL/HDL Ratio: 2.9 (calc) (ref <5.0)
Triglycerides: 61 mg/dL (ref <150)

## 2018-12-11 LAB — URINE CYTOLOGY ANCILLARY ONLY
Chlamydia: NEGATIVE
Neisseria Gonorrhea: NEGATIVE
Trichomonas: NEGATIVE

## 2018-12-11 LAB — HIV ANTIBODY (ROUTINE TESTING W REFLEX): HIV 1&2 Ab, 4th Generation: NONREACTIVE

## 2018-12-14 NOTE — Progress Notes (Signed)
12/16/2018  3:05 PM   Joseph Strong 04-21-1971 [id number]  Referring provider: Kerman Passey, MD [address] [place], [place] [address]  Chief Complaint  Patient presents with  . Follow-up    HPI: Joseph Strong is a 48 y.o. male African American with BPH with LU TS, chronic pain and dysuria who presents for a IPSS, PSA and exam.  Background history Patient was referred by Dr. [name] for dysuria and chronic groin pain.  He has been having pain in his left suprapubic region for greater than one year.  8-9/10 pain.  "Pain that feels numb at the same time, it's hard to describe."   Pain comes and goes.  It can stay for one hour and a half.  Drinking fluids makes it worse.  Multi vitamin makes the pain better.   He states the pain does not cause nausea or vomiting.  He states the pain moves around his abdomen.  He states he sometime has cramping pain.  He admits to having constipation.  His UA was unremarkable.   He states he is sexually active.  He complains of a rash on his arm that occurs intermittently.     CT Renal stone study performed on 09/03/2017 noted small recurrent ventral hernia at the site of prior repair, with a small (less than 4 cubic cm) volume of herniated omental adipose tissue.  Findings in both inguinal regions suggesting prior inguinal hernia repairs. Dextroconvex thoracic scoliosis and mild levoconvex lumbar scoliosis.  Prominent stool throughout the colon favors constipation.  A specific cause for the patient's left lower abdominal/groin pain and flank pain is not identified.    Cystoscopy performed on 09/16/2017 with Dr. Berneice Heinrich was negative.    On 05/06/2018, his IPSS score was 8, which is moderate lower urinary tract symptomatology.  He was mostly satisfied with his quality life due to his urinary symptoms.  His PVR was 5 mL.   His main complaints were painful urination and nocturia.  His previous I PSS score 5/2.  His previous PVR was 4 mL.  He had  not taken finasteride or the tamsulosin in four months.  He denied any dysuria, hematuria or suprapubic pain.   He also denied any recent fevers, chills, nausea or vomiting.  He does not have a family history of PCa.   He states he was still having the discomfort in his left suprapubic region and rectal pressure.  He had an appointment for a colonoscopy.  BPH WITH LUTS  (prostate and/or bladder) IPSS score: 11/1  Previous score: 8/2  Previous PVR: 5mL  Major complaint(s):  Intermittent dysuria.  Denies any hematuria or suprapubic pain.  Admits intermittant dysuria that waxes and wanes, but is aware he is chronically dehydrated, and the left superpubic pain has continued to occur.  Denies any recent fevers, chills, nausea or vomiting.  He does not have a family history of PCa.  IPSS    Row Name 12/16/18 1400         International Prostate Symptom Score   How often have you had the sensation of not emptying your bladder?  Less than 1 in 5     How often have you had to urinate less than every two hours?  Less than half the time     How often have you found you stopped and started again several times when you urinated?  Less than 1 in 5 times     How often have you found  it difficult to postpone urination?  Less than 1 in 5 times     How often have you had a weak urinary stream?  Less than half the time     How often have you had to strain to start urination?  Less than half the time     How many times did you typically get up at night to urinate?  2 Times     Total IPSS Score  11       Quality of Life due to urinary symptoms   If you were to spend the rest of your life with your urinary condition just the way it is now how would you feel about that?  Pleased        Score:  1-7 Mild 8-19 Moderate 20-35 Severe    PMH: Past Medical History:  Diagnosis Date  . Burning with urination 01-10-16  . Constipation   . Dysrhythmia    "OCCASSIONAL SPEEDING UP OF HEART RATE" PT THINKING IT  MAY BE DUE TO HERNIA  . Headache    MIGRAINES  . Inguinal hernia    left     Surgical History: Past Surgical History:  Procedure Laterality Date  . HERNIA REPAIR Left 1998   AND UMBILICAL  . HERNIA REPAIR     48 years of age  . INGUINAL HERNIA REPAIR Right 01/11/2016   Procedure: HERNIA REPAIR INGUINAL ADULT;  Surgeon: Kieth Brightly, MD;  Location: [facility] ORS;  Service: General;  Laterality: Right;  . TONSILLECTOMY     childhood    Home Medications:  Allergies as of 12/16/2018   No Known Allergies     Medication List       Accurate as of December 16, 2018  3:05 PM. Always use your most recent med list.        acetaminophen 500 MG tablet Commonly known as:  TYLENOL Take 500 mg by mouth every 6 (six) hours as needed for mild pain.   multivitamin tablet Take 1 tablet by mouth daily.       Allergies: No Known Allergies  Family History: Family History  Problem Relation Age of Onset  . Hypertension Maternal Grandmother   . Prostate cancer Neg Hx   . Kidney cancer Neg Hx   . Bladder Cancer Neg Hx     Social History:  reports that he has been smoking cigars and cigarettes. He has never used smokeless tobacco. He reports current alcohol use. He reports that he does not use drugs.  ROS: UROLOGY Frequent Urination?: No Hard to postpone urination?: No Burning/pain with urination?: Yes Get up at night to urinate?: Yes Leakage of urine?: No Urine stream starts and stops?: No Trouble starting stream?: No Do you have to strain to urinate?: No Blood in urine?: No Urinary tract infection?: No Sexually transmitted disease?: No Injury to kidneys or bladder?: No Painful intercourse?: No Weak stream?: No Erection problems?: No Penile pain?: No  Gastrointestinal Nausea?: No Vomiting?: No Indigestion/heartburn?: No Diarrhea?: No Constipation?: No  Constitutional Fever: No Night sweats?: No Weight loss?: No Fatigue?: Yes  Skin Skin rash/lesions?:  Yes Itching?: No  Eyes Blurred vision?: No Double vision?: No  Ears/Nose/Throat Sore throat?: No Sinus problems?: No  Hematologic/Lymphatic Swollen glands?: No Easy bruising?: No  Cardiovascular Leg swelling?: No Chest pain?: No  Respiratory Cough?: No Shortness of breath?: No  Endocrine Excessive thirst?: No  Musculoskeletal Back pain?: No Joint pain?: No  Neurological Headaches?: No Dizziness?: No  Psychologic Depression?: No  Anxiety?: No  Physical Exam: BP 130/86   Pulse 78   Ht 5\' 9"  (1.753 m)   Wt 183 lb (83 kg)   BMI 27.02 kg/m   Constitutional:  Well nourished. Alert and oriented, No acute distress. Cardiovascular: No clubbing, cyanosis, or edema. Respiratory: Normal respiratory effort, no increased work of breathing. GU: No CVA tenderness.  No bladder fullness or masses.  Patient with circumcised phallus.  Urethral meatus is patent.  No penile discharge. No penile lesions or rashes. Scrotum without lesions, cysts, rashes and/or edema.  Testicles are located scrotally bilaterally. No masses are appreciated in the testicles. Left and right epididymis are normal. Rectal: Patient with normal sphincter tone. Anus and perineum without scarring or rashes. No rectal masses are appreciated. Prostate is approximately 60 grams, no nodules are appreciated. Seminal vesicles cannot be palpated. Skin: No rashes, bruises or suspicious lesions. Neurologic: Grossly intact, no focal deficits, moving all 4 extremities. Psychiatric: Normal mood and affect.  Laboratory Data: Lab Results  Component Value Date   WBC 4.0 12/10/2018   HGB 16.2 12/10/2018   HCT 48.2 12/10/2018   MCV 85.3 12/10/2018   PLT 301 12/10/2018    Lab Results  Component Value Date   CREATININE 1.38 (H) 12/10/2018   PSA  0.8 in 08/2015 Lab Results  Component Value Date   PSA 0.7 11/05/2016  PSA 0.8 in 03/2017 - started finasteride, but he has not taken the finasteride in 4 months PSA 1.1  in 03/2018 Lab Results  Component Value Date   HGBA1C 5.4 11/07/2016     Lab Results  Component Value Date   AST 15 12/10/2018   Lab Results  Component Value Date   ALT 15 12/10/2018   I have reviewed the labs.   Pertinent Imaging: Results for SHAUNDELL, [facility]" (MRN [id number]) as of 05/06/2018 14:58  Ref. Range 05/06/2018 14:36  Scan Result Unknown 0    Assessment & Plan:    1. Chronic Groin Pain/PFD - Likely genitofemoral nerve irritation from prior hernia repair - was recommended to have a colonoscopy  2. BPH with LUTS - IPSS score is 11/1, it is worsening - Continue conservative management, avoiding bladder irritants and timed voiding's - PSA today - RTC in 1 year for I PSS, PSA and exam   Return in about 1 year (around 12/17/2019) for IPSS, PSA and exam.  These notes generated with voice recognition software. I apologize for typographical errors.  Duanne Moron  [facility] [address] [place], [place] [address] [phone]  I, Duanne Moron, am acting as a Neurosurgeon for [employer], PA-C.   I have reviewed the above documentation for accuracy and completeness, and I agree with the above.    Michiel Cowboy, PA-C

## 2018-12-16 ENCOUNTER — Encounter: Payer: Self-pay | Admitting: Urology

## 2018-12-16 ENCOUNTER — Ambulatory Visit (INDEPENDENT_AMBULATORY_CARE_PROVIDER_SITE_OTHER): Payer: 59 | Admitting: Urology

## 2018-12-16 VITALS — BP 130/86 | HR 78 | Ht 69.0 in | Wt 183.0 lb

## 2018-12-16 DIAGNOSIS — N138 Other obstructive and reflux uropathy: Secondary | ICD-10-CM | POA: Diagnosis not present

## 2018-12-16 DIAGNOSIS — N401 Enlarged prostate with lower urinary tract symptoms: Secondary | ICD-10-CM

## 2018-12-16 DIAGNOSIS — G8929 Other chronic pain: Secondary | ICD-10-CM

## 2018-12-16 DIAGNOSIS — R1032 Left lower quadrant pain: Secondary | ICD-10-CM

## 2018-12-17 ENCOUNTER — Telehealth: Payer: Self-pay

## 2018-12-17 LAB — PSA: Prostate Specific Ag, Serum: 0.8 ng/mL (ref 0.0–4.0)

## 2018-12-17 NOTE — Telephone Encounter (Signed)
Called patient but no voicemail set up. Will try again later.

## 2018-12-17 NOTE — Telephone Encounter (Signed)
Pt called office and I read results from [facility].

## 2018-12-17 NOTE — Telephone Encounter (Signed)
-----   Message from Harle BattiestShannon A McGowan, PA-C sent at 12/17/2018  8:42 AM EST ----- Please let Mr. Fayrene FearingJames know that his PSA was stable at 0.8.  We will see him next year.

## 2018-12-21 ENCOUNTER — Encounter: Payer: Self-pay | Admitting: *Deleted

## 2019-06-30 ENCOUNTER — Ambulatory Visit
Admission: EM | Admit: 2019-06-30 | Discharge: 2019-06-30 | Disposition: A | Discharge status: Home / Self Care | Payer: Worker's Compensation | Attending: Family Medicine | Admitting: Family Medicine

## 2019-06-30 ENCOUNTER — Ambulatory Visit (INDEPENDENT_AMBULATORY_CARE_PROVIDER_SITE_OTHER): Payer: Worker's Compensation

## 2019-06-30 DIAGNOSIS — W231XXA Caught, crushed, jammed, or pinched between stationary objects, initial encounter: Secondary | ICD-10-CM | POA: Diagnosis not present

## 2019-06-30 DIAGNOSIS — Z042 Encounter for examination and observation following work accident: Secondary | ICD-10-CM

## 2019-06-30 DIAGNOSIS — S6991XA Unspecified injury of right wrist, hand and finger(s), initial encounter: Secondary | ICD-10-CM

## 2019-06-30 DIAGNOSIS — M79644 Pain in right finger(s): Secondary | ICD-10-CM | POA: Diagnosis not present

## 2019-06-30 MED ORDER — MELOXICAM 15 MG PO TABS: 15.0000 mg | ORAL_TABLET | Freq: Every day | ORAL | 0 refills | Status: DC | PRN

## 2019-06-30 NOTE — Discharge Instructions (Signed)
Ice as needed.  Medication as needed.  May return to work.  Dr. Lacinda Axon

## 2019-06-30 NOTE — ED Triage Notes (Signed)
Pt states he smashed his right middle finger in the lid at work and it is currently swollen and red. States he can move it but it is stiff and painful.

## 2019-06-30 NOTE — ED Provider Notes (Signed)
[facility]    CSN: [id number] Arrival date & time: 06/30/19  1054  History   Chief Complaint Chief Complaint  Patient presents with  . Work Related Injury   HPI  48 year old male presents with a Workmen's Comp. Injury.  Patient reports that he smashed his right middle finger in a lid of a trash receptacle yesterday afternoon.  Reports pain, swelling, redness at the DIP joint and extending distally.  He is able to move it but it is painful.  He is 5/10 in severity.  Worse with activity.  No relieving factors.  No other associated symptoms.  No other complaints.  PMH, Surgical Hx, Family Hx, Social History reviewed and updated as below.  Past Medical History:  Diagnosis Date  . Burning with urination 01-10-16  . Constipation   . Dysrhythmia    "OCCASSIONAL SPEEDING UP OF HEART RATE" PT THINKING IT MAY BE DUE TO HERNIA  . Headache    MIGRAINES  . Inguinal hernia    left     Patient Active Problem List   Diagnosis Date Noted  . Pancreas divisum 10/09/2018  . Constipation 10/09/2018  . Ventral hernia without obstruction or gangrene 10/09/2018  . Prostate cancer screening 09/16/2017  . Abdominal pain, periumbilical 12/20/2016  . Decreased frequency of bowel movements 01/10/2016  . Left paraspinal back pain 12/08/2015  . Dizziness, nonspecific 10/31/2015  . Abnormal TSH 10/31/2015  . Chronic pain of left groin 10/31/2015  . Abnormal EKG 10/31/2015  . Testicular pain, left 09/20/2015  . S/P unilateral inguinal hernia repair 09/20/2015    Past Surgical History:  Procedure Laterality Date  . HERNIA REPAIR Left 1998   AND UMBILICAL  . HERNIA REPAIR     48 years of age  . INGUINAL HERNIA REPAIR Right 01/11/2016   Procedure: HERNIA REPAIR INGUINAL ADULT;  Surgeon: Kieth BrightlySeeplaputhur G Sankar, MD;  Location: [facility] ORS;  Service: General;  Laterality: Right;  . TONSILLECTOMY     childhood   Home Medications    Prior to Admission medications   Medication Sig Start  Date End Date Taking? Authorizing Provider  acetaminophen (TYLENOL) 500 MG tablet Take 500 mg by mouth every 6 (six) hours as needed for mild pain.    [provider]  meloxicam (MOBIC) 15 MG tablet Take 1 tablet (15 mg total) by mouth daily as needed for pain. 06/30/19   Tommie Samsook, Vikki Gains G, DO  Multiple Vitamin (MULTIVITAMIN) tablet Take 1 tablet by mouth daily.    [provider]    Family History Family History  Problem Relation Age of Onset  . Hypertension Maternal Grandmother   . Prostate cancer Neg Hx   . Kidney cancer Neg Hx   . Bladder Cancer Neg Hx     Social History Social History   Tobacco Use  . Smoking status: Light Tobacco Smoker    Types: Cigars, Cigarettes  . Smokeless tobacco: Never Used  Substance Use Topics  . Alcohol use: Yes    Alcohol/week: 0.0 standard drinks    Comment: occasional  . Drug use: No     Allergies   Patient has no known allergies.   Review of Systems Review of Systems  Constitutional: Negative.   Musculoskeletal:       Right middle finger injury.   Physical Exam Triage Vital Signs ED Triage Vitals  Enc Vitals Group     BP 06/30/19 1122 111/77     Pulse Rate 06/30/19 1122 71     Resp 06/30/19  1122 18     Temp 06/30/19 1122 98 F (36.7 C)     Temp Source 06/30/19 1122 Oral     SpO2 06/30/19 1122 99 %     Weight 06/30/19 1124 185 lb (83.9 kg)     Height 06/30/19 1124 6' (1.829 m)     Head Circumference --      Peak Flow --      Pain Score 06/30/19 1123 5     Pain Loc --      Pain Edu? --      Excl. in [place]? --    Updated Vital Signs BP 111/77 (BP Location: Right Arm)   Pulse 71   Temp 98 F (36.7 C) (Oral)   Resp 18   Ht 6' (1.829 m)   Wt 83.9 kg   SpO2 99%   BMI 25.09 kg/m   Visual Acuity Right Eye Distance:   Left Eye Distance:   Bilateral Distance:    Right Eye Near:   Left Eye Near:    Bilateral Near:     Physical Exam Vitals signs and nursing note reviewed.  Constitutional:       General: He is not in acute distress.    Appearance: Normal appearance.  HENT:     Head: Normocephalic and atraumatic.  Eyes:     General:        Right eye: No discharge.        Left eye: No discharge.     Conjunctiva/sclera: Conjunctivae normal.  Cardiovascular:     Rate and Rhythm: Normal rate and regular rhythm.  Pulmonary:     Effort: Pulmonary effort is normal. No respiratory distress.  Musculoskeletal:     Comments: Right middle finger -patient with tenderness to palpation at the DIP joint.  Mild swelling.  Mild erythema.  Neurological:     Mental Status: He is alert.  Psychiatric:        Mood and Affect: Mood normal.        Behavior: Behavior normal.    UC Treatments / Results  Labs (all labs ordered are listed, but only abnormal results are displayed) Labs Reviewed - No data to display  EKG   Radiology Dg Finger Middle Right  Result Date: 06/30/2019 CLINICAL DATA:  Smashing injury. EXAM: RIGHT MIDDLE FINGER 2+V COMPARISON:  No recent. FINDINGS: No acute bony or joint abnormality. No evidence of fracture or dislocation. IMPRESSION: No acute abnormality. Electronically Signed   By: Marcello Moores  Register   On: 06/30/2019 12:06    Procedures Procedures (including critical care time)  Medications Ordered in UC Medications - No data to display  Initial Impression / Assessment and Plan / UC Course  I have reviewed the triage vital signs and the nursing notes.  Pertinent labs & imaging results that were available during my care of the patient were reviewed by me and considered in my medical decision making (see chart for details).    48 year old male presents with an injury to his DIP joint of the right middle finger.  X-ray negative.  Meloxicam as directed.  Workmen's Comp. form filled out.  May return to work tomorrow.  Final Clinical Impressions(s) / UC Diagnoses   Final diagnoses:  Injury of finger of right hand, initial encounter     Discharge Instructions      Ice as needed.  Medication as needed.  May return to work.  Dr. Lacinda Axon     ED Prescriptions    Medication Sig Dispense Auth.  Provider   meloxicam (MOBIC) 15 MG tablet Take 1 tablet (15 mg total) by mouth daily as needed for pain. 30 tablet Tommie Samsook, Dyshon Philbin G, DO     Controlled Substance Prescriptions [place] Controlled Substance Registry consulted? Not Applicable   Tommie SamsCook, Jathen Sudano G, DO 06/30/19 1244

## 2019-07-14 ENCOUNTER — Telehealth: Payer: Self-pay | Admitting: Gastroenterology

## 2019-07-14 NOTE — Telephone Encounter (Signed)
Pt is calling to schedule a colonoscopy please call after 4PM or 4:30PM cb  [phone]

## 2019-07-15 NOTE — Telephone Encounter (Signed)
Attempted to return patients call to schedule his colonoscopy, however "Call could not be completed at this time".  Thanks,  Sharyn Lull

## 2019-12-08 ENCOUNTER — Other Ambulatory Visit: Payer: Self-pay

## 2019-12-08 DIAGNOSIS — N138 Other obstructive and reflux uropathy: Secondary | ICD-10-CM

## 2019-12-09 ENCOUNTER — Other Ambulatory Visit: Payer: 59

## 2019-12-09 ENCOUNTER — Encounter: Payer: Self-pay | Admitting: Urology

## 2019-12-13 ENCOUNTER — Encounter: Payer: 59 | Admitting: Family Medicine

## 2019-12-15 NOTE — Progress Notes (Deleted)
12/16/2018  10:28 PM   Joseph Strong 1970-12-27 [id number]  Referring provider: Kerman Passey, MD [address] [place],  [place] [address]  No chief complaint on file.   HPI: Joseph Strong is a 49 y.o. male with BPH with LU TS, chronic pain and dysuria who presents for a IPSS, PSA and exam.  BPH WITH LUTS  (prostate and/or bladder) IPSS score: *** PVR: ***   Previous score: 11/1   Previous PVR: 0 mL  Major complaint(s):  x *** years. Denies any dysuria, hematuria or suprapubic pain.   Currently taking: ***.  Cystoscopy performed on 09/16/2017 with Dr. Berneice Heinrich was negative.    Denies any recent fevers, chills, nausea or vomiting.  He has a family history of PCa, with ***.   He does not have a family history of PCa.***    Score:  1-7 Mild 8-19 Moderate 20-35 Severe    Chronic pelvic pain/dysuria Patient was referred by Dr. [name] for dysuria and chronic groin pain.  He has been having pain in his left suprapubic region for greater than one year.  8-9/10 pain.  "Pain that feels numb at the same time, it's hard to describe."   Pain comes and goes.  It can stay for one hour and a half.  Drinking fluids makes it worse.  Multi vitamin makes the pain better.   He states the pain does not cause nausea or vomiting.  He states the pain moves around his abdomen.  He states he sometime has cramping pain.  He admits to having constipation.  His UA was unremarkable.   He states he is sexually active.  He complains of a rash on his arm that occurs intermittently.   CT Renal stone study performed on 09/03/2017 noted small recurrent ventral hernia at the site of prior repair, with a small (less than 4 cubic cm) volume of herniated omental adipose tissue.  Findings in both inguinal regions suggesting prior inguinal hernia repairs. Dextroconvex thoracic scoliosis and mild levoconvex lumbar scoliosis.  Prominent stool throughout the colon favors constipation.  A specific cause for  the patient's left lower abdominal/groin pain and flank pain is not identified.     PMH: Past Medical History:  Diagnosis Date  . Burning with urination 01-10-16  . Constipation   . Dysrhythmia    "OCCASSIONAL SPEEDING UP OF HEART RATE" PT THINKING IT MAY BE DUE TO HERNIA  . Headache    MIGRAINES  . Inguinal hernia    left     Surgical History: Past Surgical History:  Procedure Laterality Date  . HERNIA REPAIR Left 1998   AND UMBILICAL  . HERNIA REPAIR     49 years of age  . INGUINAL HERNIA REPAIR Right 01/11/2016   Procedure: HERNIA REPAIR INGUINAL ADULT;  Surgeon: Kieth Brightly, MD;  Location: [facility] ORS;  Service: General;  Laterality: Right;  . TONSILLECTOMY     childhood    Home Medications:  Allergies as of 12/16/2019   No Known Allergies     Medication List       Accurate as of December 15, 2019 10:28 PM. If you have any questions, ask your nurse or doctor.        acetaminophen 500 MG tablet Commonly known as: TYLENOL Take 500 mg by mouth every 6 (six) hours as needed for mild pain.   meloxicam 15 MG tablet Commonly known as: MOBIC Take 1 tablet (15 mg total) by mouth daily as needed  for pain.   multivitamin tablet Take 1 tablet by mouth daily.       Allergies: No Known Allergies  Family History: Family History  Problem Relation Age of Onset  . Hypertension Maternal Grandmother   . Prostate cancer Neg Hx   . Kidney cancer Neg Hx   . Bladder Cancer Neg Hx     Social History:  reports that he has been smoking cigars and cigarettes. He has never used smokeless tobacco. He reports current alcohol use. He reports that he does not use drugs.  ROS:                                        Physical Exam: There were no vitals taken for this visit.  Constitutional:  Well nourished. Alert and oriented, No acute distress. HEENT: [place] AT, moist mucus membranes.  Trachea midline, no masses. Cardiovascular: No clubbing,  cyanosis, or edema. Respiratory: Normal respiratory effort, no increased work of breathing. GI: Abdomen is soft, non tender, non distended, no abdominal masses. Liver and spleen not palpable.  No hernias appreciated.  Stool sample for occult testing is not indicated.   GU: No CVA tenderness.  No bladder fullness or masses.  Patient with circumcised/uncircumcised phallus. ***Foreskin easily retracted***  Urethral meatus is patent.  No penile discharge. No penile lesions or rashes. Scrotum without lesions, cysts, rashes and/or edema.  Testicles are located scrotally bilaterally. No masses are appreciated in the testicles. Left and right epididymis are normal. Rectal: Patient with  normal sphincter tone. Anus and perineum without scarring or rashes. No rectal masses are appreciated. Prostate is approximately *** grams, *** nodules are appreciated. Seminal vesicles are normal. Skin: No rashes, bruises or suspicious lesions. Lymph: No cervical or inguinal adenopathy. Neurologic: Grossly intact, no focal deficits, moving all 4 extremities. Psychiatric: Normal mood and affect.  Laboratory Data: Lab Results  Component Value Date   WBC 4.0 12/10/2018   HGB 16.2 12/10/2018   HCT 48.2 12/10/2018   MCV 85.3 12/10/2018   PLT 301 12/10/2018    Lab Results  Component Value Date   CREATININE 1.38 (H) 12/10/2018   PSA  0.8 in 08/2015 Lab Results  Component Value Date   PSA 0.7 11/05/2016  PSA 0.8 in 03/2017 - started finasteride, but he has not taken the finasteride in 4 months PSA 1.1 in 03/2018 Lab Results  Component Value Date   HGBA1C 5.4 11/07/2016     Lab Results  Component Value Date   AST 15 12/10/2018   Lab Results  Component Value Date   ALT 15 12/10/2018   I have reviewed the labs.   Pertinent Imaging: Results for [name], [facility]" (MRN [id number]) as of 05/06/2018 14:58  Ref. Range 05/06/2018 14:36  Scan Result Unknown 0    Assessment & Plan:    1. Chronic Groin  Pain/PFD - Likely genitofemoral nerve irritation from prior hernia repair - was recommended to have a colonoscopy  2. BPH with LUTS - IPSS score is 11/1, it is worsening - Continue conservative management, avoiding bladder irritants and timed voiding's - PSA today - RTC in 1 year for I PSS, PSA and exam   No follow-ups on file.  These notes generated with voice recognition software. I apologize for typographical errors.  Zara Council, PA-C  [facility] [address] [place],  [address] [phone]

## 2019-12-16 ENCOUNTER — Encounter: Payer: Self-pay | Admitting: Urology

## 2019-12-16 ENCOUNTER — Ambulatory Visit: Payer: 59 | Admitting: Urology

## 2019-12-29 ENCOUNTER — Other Ambulatory Visit: Payer: Self-pay

## 2019-12-29 ENCOUNTER — Ambulatory Visit (INDEPENDENT_AMBULATORY_CARE_PROVIDER_SITE_OTHER): Payer: PRIVATE HEALTH INSURANCE | Admitting: Family Medicine

## 2019-12-29 ENCOUNTER — Encounter: Payer: Self-pay | Admitting: Family Medicine

## 2019-12-29 VITALS — BP 130/82 | HR 96 | Temp 97.5°F | Resp 14 | Ht 72.0 in | Wt 184.0 lb

## 2019-12-29 DIAGNOSIS — R7989 Other specified abnormal findings of blood chemistry: Secondary | ICD-10-CM | POA: Diagnosis not present

## 2019-12-29 DIAGNOSIS — Z13220 Encounter for screening for lipoid disorders: Secondary | ICD-10-CM

## 2019-12-29 DIAGNOSIS — Z130 Encounter for screening for diseases of the blood and blood-forming organs and certain disorders involving the immune mechanism: Secondary | ICD-10-CM

## 2019-12-29 DIAGNOSIS — Z13228 Encounter for screening for other metabolic disorders: Secondary | ICD-10-CM

## 2019-12-29 DIAGNOSIS — M419 Scoliosis, unspecified: Secondary | ICD-10-CM

## 2019-12-29 DIAGNOSIS — N401 Enlarged prostate with lower urinary tract symptoms: Secondary | ICD-10-CM

## 2019-12-29 DIAGNOSIS — Z1329 Encounter for screening for other suspected endocrine disorder: Secondary | ICD-10-CM

## 2019-12-29 DIAGNOSIS — Z1211 Encounter for screening for malignant neoplasm of colon: Secondary | ICD-10-CM

## 2019-12-29 DIAGNOSIS — Z0000 Encounter for general adult medical examination without abnormal findings: Secondary | ICD-10-CM | POA: Diagnosis not present

## 2019-12-29 NOTE — Patient Instructions (Signed)
Preventive Care 41-49 Years Old, Male Preventive care refers to lifestyle choices and visits with your health care provider that can promote health and wellness. This includes:  A yearly physical exam. This is also called an annual well check.  Regular dental and eye exams.  Immunizations.  Screening for certain conditions.  Healthy lifestyle choices, such as eating a healthy diet, getting regular exercise, not using drugs or products that contain nicotine and tobacco, and limiting alcohol use. What can I expect for my preventive care visit? Physical exam Your health care provider will check:  Height and weight. These may be used to calculate body mass index (BMI), which is a measurement that tells if you are at a healthy weight.  Heart rate and blood pressure.  Your skin for abnormal spots. Counseling Your health care provider may ask you questions about:  Alcohol, tobacco, and drug use.  Emotional well-being.  Home and relationship well-being.  Sexual activity.  Eating habits.  Work and work Statistician. What immunizations do I need?  Influenza (flu) vaccine  This is recommended every year. Tetanus, diphtheria, and pertussis (Tdap) vaccine  You may need a Td booster every 10 years. Varicella (chickenpox) vaccine  You may need this vaccine if you have not already been vaccinated. Zoster (shingles) vaccine  You may need this after age 64. Measles, mumps, and rubella (MMR) vaccine  You may need at least one dose of MMR if you were born in 1957 or later. You may also need a second dose. Pneumococcal conjugate (PCV13) vaccine  You may need this if you have certain conditions and were not previously vaccinated. Pneumococcal polysaccharide (PPSV23) vaccine  You may need one or two doses if you smoke cigarettes or if you have certain conditions. Meningococcal conjugate (MenACWY) vaccine  You may need this if you have certain conditions. Hepatitis A  vaccine  You may need this if you have certain conditions or if you travel or work in places where you may be exposed to hepatitis A. Hepatitis B vaccine  You may need this if you have certain conditions or if you travel or work in places where you may be exposed to hepatitis B. Haemophilus influenzae type b (Hib) vaccine  You may need this if you have certain risk factors. Human papillomavirus (HPV) vaccine  If recommended by your health care provider, you may need three doses over 6 months. You may receive vaccines as individual doses or as more than one vaccine together in one shot (combination vaccines). Talk with your health care provider about the risks and benefits of combination vaccines. What tests do I need? Blood tests  Lipid and cholesterol levels. These may be checked every 5 years, or more frequently if you are over 60 years old.  Hepatitis C test.  Hepatitis B test. Screening  Lung cancer screening. You may have this screening every year starting at age 43 if you have a 30-pack-year history of smoking and currently smoke or have quit within the past 15 years.  Prostate cancer screening. Recommendations will vary depending on your family history and other risks.  Colorectal cancer screening. All adults should have this screening starting at age 72 and continuing until age 2. Your health care provider may recommend screening at age 14 if you are at increased risk. You will have tests every 1-10 years, depending on your results and the type of screening test.  Diabetes screening. This is done by checking your blood sugar (glucose) after you have not eaten  for a while (fasting). You may have this done every 1-3 years.  Sexually transmitted disease (STD) testing. Follow these instructions at home: Eating and drinking  Eat a diet that includes fresh fruits and vegetables, whole grains, lean protein, and low-fat dairy products.  Take vitamin and mineral supplements as  recommended by your health care provider.  Do not drink alcohol if your health care provider tells you not to drink.  If you drink alcohol: ? Limit how much you have to 0-2 drinks a day. ? Be aware of how much alcohol is in your drink. In the [place] one drink equals one 12 oz bottle of beer (355 mL), one 5 oz glass of wine (148 mL), or one 1 oz glass of hard liquor (44 mL). Lifestyle  Take daily care of your teeth and gums.  Stay active. Exercise for at least 30 minutes on 5 or more days each week.  Do not use any products that contain nicotine or tobacco, such as cigarettes, e-cigarettes, and chewing tobacco. If you need help quitting, ask your health care provider.  If you are sexually active, practice safe sex. Use a condom or other form of protection to prevent STIs (sexually transmitted infections).  Talk with your health care provider about taking a low-dose aspirin every day starting at age 53. What's next?  Go to your health care provider once a year for a well check visit.  Ask your health care provider how often you should have your eyes and teeth checked.  Stay up to date on all vaccines. This information is not intended to replace advice given to you by your health care provider. Make sure you discuss any questions you have with your health care provider. Document Revised: 11/05/2018 Document Reviewed: 11/05/2018 Elsevier Patient Education  2020 [employer].

## 2019-12-29 NOTE — Progress Notes (Signed)
Patient: Joseph Strong, Male    DOB: 19-Dec-1970, 49 y.o.   MRN: [id number] Danelle Berry, PA-C Visit Date: 12/29/2019  Today's Provider: Danelle Berry, PA-C   Chief Complaint  Patient presents with  . Annual Exam   Subjective:   Annual physical exam:  Joseph Strong is a 49 y.o. male who presents today for health maintenance and annual & complete physical exam.  He feels well He reports exercising - works out Diet tried to eat vegetables - a good amount of eating out, but about half and half depending on how busy he is. He reports he is sleeping well.  Needs to f/up with GI   Has urology f/up for annual    USPSTF grade A and B recommendations - reviewed and addressed today  Depression:  Phq 9 completed today by patient, was reviewed by me with patient in the room, score is  negative, pt feels good moods PHQ 2/9 Scores 12/29/2019 12/10/2018 10/09/2018 04/24/2018  PHQ - 2 Score 0 0 0 0  PHQ- 9 Score 0 0 0 -   Depression screen [facility] 2/9 12/29/2019 12/10/2018 10/09/2018 04/24/2018 12/01/2017  Decreased Interest 0 0 0 0 0  Down, Depressed, Hopeless 0 0 0 0 0  PHQ - 2 Score 0 0 0 0 0  Altered sleeping 0 0 0 - -  Tired, decreased energy 0 0 0 - -  Change in appetite 0 0 0 - -  Feeling bad or failure about yourself  0 0 0 - -  Trouble concentrating 0 0 0 - -  Moving slowly or fidgety/restless 0 0 0 - -  Suicidal thoughts 0 0 0 - -  PHQ-9 Score 0 0 0 - -  Difficult doing work/chores Not difficult at all Not difficult at all - - -    Hep C Screening: n/a STD testing and prevention (HIV/chl/gon/syphilis): HIV and STDs done no new partners Prostate cancer:   Urology f/up Lab Results  Component Value Date   PSA 0.7 11/05/2016   Intimate partner violence:  Safe denies abuse  Advanced Care Planning:  A voluntary discussion about advance care planning including the explanation and discussion of advance directives.  Discussed health care proxy and Living will, and the patient was able to  identify a health care proxy as his grandmother on chart  Patient does not have a living will at present time. If patient does have living will, I have requested they bring this to the clinic to be scanned in to their chart.   Skin cancer: .  Pt reports no hx of skin cancer, suspicious lesions/biopsies in the past.  Had rashes that come and go to [facility] fossa arms, comes and goes  Colorectal cancer:  colonoscopy is - going to GI  Lung cancer:   Low Dose CT Chest recommended if Age 70-80 years, 30 pack-year currently smoking OR have quit w/in 15years. Patient does not qualify.   Social History   Tobacco Use  . Smoking status: Light Tobacco Smoker    Types: Cigars, Cigarettes  . Smokeless tobacco: Never Used  Substance Use Topics  . Alcohol use: Yes    Alcohol/week: 0.0 standard drinks    Comment: occasional     Alcohol screening:   Office Visit from 12/29/2019 in [facility]  AUDIT-C Score  1      AAA:  The USPSTF recommends one-time screening with ultrasonography in men ages 42 to 62 years who have ever smoked  VOZ:DGUY  today  Blood pressure/Hypertension: BP Readings from Last 3 Encounters:  12/29/19 130/82  06/30/19 111/77  12/16/18 130/86   Weight/Obesity: Wt Readings from Last 3 Encounters:  12/29/19 184 lb (83.5 kg)  06/30/19 185 lb (83.9 kg)  12/16/18 183 lb (83 kg)   BMI Readings from Last 3 Encounters:  12/29/19 24.95 kg/m  06/30/19 25.09 kg/m  12/16/18 27.02 kg/m    Lipids:  Lab Results  Component Value Date   CHOL 167 12/10/2018   CHOL 169 12/01/2017   CHOL 161 11/05/2016   Lab Results  Component Value Date   HDL 57 12/10/2018   HDL 67 12/01/2017   HDL 69 11/05/2016   Lab Results  Component Value Date   LDLCALC 95 12/10/2018   LDLCALC 88 12/01/2017   LDLCALC 84 11/05/2016   Lab Results  Component Value Date   TRIG 61 12/10/2018   TRIG 46 12/01/2017   TRIG 38 11/05/2016   Lab Results  Component Value Date   CHOLHDL  2.9 12/10/2018   CHOLHDL 2.5 12/01/2017   CHOLHDL 2.3 11/05/2016   No results found for: LDLDIRECT Based on the results of lipid panel his/her cardiovascular risk factor ( using [facility] )  in the next 10 years is : The 10-year ASCVD risk score Denman George DC Montez Hageman., et al., 2013) is: 7.7%   Values used to calculate the score:     Age: 89 years     Sex: Male     Is Non-Hispanic African American: Yes     Diabetic: No     Tobacco smoker: Yes     Systolic Blood Pressure: 130 mmHg     Is BP treated: No     HDL Cholesterol: 57 mg/dL     Total Cholesterol: 167 mg/dL Glucose:  Glucose  Date Value Ref Range Status  07/10/2017 81 65 - 99 mg/dL Final    Comment:    Specimen received in contact with cells. No visible hemolysis present. However GLUC may be decreased and K increased. Clinical correlation indicated.   09/30/2014 92 65 - 99 mg/dL Final  16/08/9603 83 65 - 99 mg/dL Final  54/07/8118 56 (L) 65 - 99 mg/dL Final   Glucose, Bld  Date Value Ref Range Status  12/10/2018 74 65 - 99 mg/dL Final    Comment:    .            Fasting reference interval .   12/01/2017 87 65 - 99 mg/dL Final    Comment:    .            Fasting reference interval .   12/20/2016 88 65 - 99 mg/dL Final    Social History      He  reports that he has been smoking cigars and cigarettes. He has never used smokeless tobacco. He reports current alcohol use. He reports that he does not use drugs.       Social History   Socioeconomic History  . Marital status: Single    Spouse name: Not on file  . Number of children: 1  . Years of education: 61  . Highest education level: 11th grade  Occupational History  . Not on file  Tobacco Use  . Smoking status: Light Tobacco Smoker    Types: Cigars, Cigarettes  . Smokeless tobacco: Never Used  Substance and Sexual Activity  . Alcohol use: Yes    Alcohol/week: 0.0 standard drinks    Comment: occasional  . Drug use: No  .  Sexual activity: Yes  Other  Topics Concern  . Not on file  Social History Narrative  . Not on file   Social Determinants of Health   Financial Resource Strain:   . Difficulty of Paying Living Expenses: Not on file  Food Insecurity:   . Worried About Programme researcher, broadcasting/film/video in the Last Year: Not on file  . Ran Out of Food in the Last Year: Not on file  Transportation Needs:   . Lack of Transportation (Medical): Not on file  . Lack of Transportation (Non-Medical): Not on file  Physical Activity:   . Days of Exercise per Week: Not on file  . Minutes of Exercise per Session: Not on file  Stress:   . Feeling of Stress : Not on file  Social Connections:   . Frequency of Communication with Friends and Family: Not on file  . Frequency of Social Gatherings with Friends and Family: Not on file  . Attends Religious Services: Not on file  . Active Member of Clubs or Organizations: Not on file  . Attends Banker Meetings: Not on file  . Marital Status: Not on file         Social History   Socioeconomic History  . Marital status: Single    Spouse name: Not on file  . Number of children: 1  . Years of education: 76  . Highest education level: 11th grade  Occupational History  . Not on file  Tobacco Use  . Smoking status: Light Tobacco Smoker    Types: Cigars, Cigarettes  . Smokeless tobacco: Never Used  Substance and Sexual Activity  . Alcohol use: Yes    Alcohol/week: 0.0 standard drinks    Comment: occasional  . Drug use: No  . Sexual activity: Yes  Other Topics Concern  . Not on file  Social History Narrative  . Not on file   Social Determinants of Health   Financial Resource Strain:   . Difficulty of Paying Living Expenses: Not on file  Food Insecurity:   . Worried About Programme researcher, broadcasting/film/video in the Last Year: Not on file  . Ran Out of Food in the Last Year: Not on file  Transportation Needs:   . Lack of Transportation (Medical): Not on file  . Lack of Transportation (Non-Medical): Not  on file  Physical Activity:   . Days of Exercise per Week: Not on file  . Minutes of Exercise per Session: Not on file  Stress:   . Feeling of Stress : Not on file  Social Connections:   . Frequency of Communication with Friends and Family: Not on file  . Frequency of Social Gatherings with Friends and Family: Not on file  . Attends Religious Services: Not on file  . Active Member of Clubs or Organizations: Not on file  . Attends Banker Meetings: Not on file  . Marital Status: Not on file  Intimate Partner Violence:   . Fear of Current or Ex-Partner: Not on file  . Emotionally Abused: Not on file  . Physically Abused: Not on file  . Sexually Abused: Not on file    Family History        Family Status  Relation Name Status  . Mother  Deceased  . Father  Deceased  . MGM  Alive  . Neg Hx  (Not Specified)        His family history includes Hypertension in his maternal grandmother. There is no history of  Prostate cancer, Kidney cancer, or Bladder Cancer.       Family History  Problem Relation Age of Onset  . Hypertension Maternal Grandmother   . Prostate cancer Neg Hx   . Kidney cancer Neg Hx   . Bladder Cancer Neg Hx     Patient Active Problem List   Diagnosis Date Noted  . Pancreas divisum 10/09/2018  . Constipation 10/09/2018  . Ventral hernia without obstruction or gangrene 10/09/2018  . Prostate cancer screening 09/16/2017  . Abdominal pain, periumbilical 12/20/2016  . Decreased frequency of bowel movements 01/10/2016  . Left paraspinal back pain 12/08/2015  . Dizziness, nonspecific 10/31/2015  . Abnormal TSH 10/31/2015  . Chronic pain of left groin 10/31/2015  . Abnormal EKG 10/31/2015  . Testicular pain, left 09/20/2015  . S/P unilateral inguinal hernia repair 09/20/2015    Past Surgical History:  Procedure Laterality Date  . HERNIA REPAIR Left 1998   AND UMBILICAL  . HERNIA REPAIR     48 years of age  . INGUINAL HERNIA REPAIR Right  01/11/2016   Procedure: HERNIA REPAIR INGUINAL ADULT;  Surgeon: Kieth Brightly, MD;  Location: [facility] ORS;  Service: General;  Laterality: Right;  . TONSILLECTOMY     childhood     Current Outpatient Medications:  .  acetaminophen (TYLENOL) 500 MG tablet, Take 500 mg by mouth every 6 (six) hours as needed for mild pain., Disp: , Rfl:  .  Multiple Vitamin (MULTIVITAMIN) tablet, Take 1 tablet by mouth daily., Disp: , Rfl:   No Known Allergies  Patient Care Team: Danelle Berry, PA-C as PCP - General (Family Medicine) Pasty Spillers, MD as Consulting Physician (Gastroenterology)  I personally reviewed active problem list, medication list, allergies, family history, social history, health maintenance, notes from last encounter, lab results, imaging with the patient/caregiver today.   Review of Systems  Constitutional: Negative.   HENT: Negative.   Eyes: Negative.   Respiratory: Negative.   Cardiovascular: Negative.   Gastrointestinal: Negative.   Endocrine: Negative.   Genitourinary: Negative.   Musculoskeletal: Negative.   Skin: Negative.   Allergic/Immunologic: Negative.   Neurological: Negative.   Hematological: Negative.   Psychiatric/Behavioral: Negative.   All other systems reviewed and are negative.         Objective:   Vitals:  Vitals:   12/29/19 1501  BP: 130/82  Pulse: 96  Resp: 14  Temp: (!) 97.5 F (36.4 C)  SpO2: 98%  Weight: 184 lb (83.5 kg)  Height: 6' (1.829 m)    Body mass index is 24.95 kg/m.  Physical Exam Vitals and nursing note reviewed.  Constitutional:      General: He is not in acute distress.    Appearance: Normal appearance. He is well-developed. He is not ill-appearing, toxic-appearing or diaphoretic.     Interventions: Face mask in place.  HENT:     Head: Normocephalic and atraumatic.     Jaw: No trismus.     Right Ear: External ear normal.     Left Ear: External ear normal.  Eyes:     General: Lids are normal. No  scleral icterus.    Conjunctiva/sclera: Conjunctivae normal.     Pupils: Pupils are equal, round, and reactive to light.  Neck:     Thyroid: No thyroid mass, thyromegaly or thyroid tenderness.     Trachea: Trachea and phonation normal. No tracheal deviation.  Cardiovascular:     Rate and Rhythm: Normal rate and regular rhythm.     Pulses:  Normal pulses.          Radial pulses are 2+ on the right side and 2+ on the left side.       Posterior tibial pulses are 2+ on the right side and 2+ on the left side.     Heart sounds: Normal heart sounds. No murmur. No friction rub. No gallop.   Pulmonary:     Effort: Pulmonary effort is normal. No respiratory distress.     Breath sounds: Normal breath sounds. No stridor. No wheezing, rhonchi or rales.  Abdominal:     General: Bowel sounds are normal. There is no distension.     Palpations: Abdomen is soft.     Tenderness: There is no abdominal tenderness. There is no guarding or rebound.  Musculoskeletal:        General: Normal range of motion.     Cervical back: Normal range of motion and neck supple.     Right lower leg: No edema.     Left lower leg: No edema.     Comments: Significant scoliosis to thoracic back  Skin:    General: Skin is warm and dry.     Capillary Refill: Capillary refill takes less than 2 seconds.     Coloration: Skin is not jaundiced.     Findings: No rash.     Nails: There is no clubbing.  Neurological:     Mental Status: He is alert.     Cranial Nerves: No dysarthria or facial asymmetry.     Motor: No tremor or abnormal muscle tone.     Gait: Gait normal.  Psychiatric:        Mood and Affect: Mood normal.        Speech: Speech normal.        Behavior: Behavior normal. Behavior is cooperative.      No results found for this or any previous visit (from the past 2160 hour(s)).  PHQ2/9: Depression screen [facility] 2/9 12/29/2019 12/10/2018 10/09/2018 04/24/2018 12/01/2017  Decreased Interest 0 0 0 0 0  Down, Depressed,  Hopeless 0 0 0 0 0  PHQ - 2 Score 0 0 0 0 0  Altered sleeping 0 0 0 - -  Tired, decreased energy 0 0 0 - -  Change in appetite 0 0 0 - -  Feeling bad or failure about yourself  0 0 0 - -  Trouble concentrating 0 0 0 - -  Moving slowly or fidgety/restless 0 0 0 - -  Suicidal thoughts 0 0 0 - -  PHQ-9 Score 0 0 0 - -  Difficult doing work/chores Not difficult at all Not difficult at all - - -    Fall Risk: Fall Risk  12/29/2019 12/10/2018 10/09/2018 04/24/2018 12/01/2017  Falls in the past year? 0 0 0 No No  Number falls in past yr: 0 0 - - -  Injury with Fall? 0 0 - - -    Functional Status Survey: Is the patient deaf or have difficulty hearing?: No Does the patient have difficulty seeing, even when wearing glasses/contacts?: No Does the patient have difficulty concentrating, remembering, or making decisions?: No Does the patient have difficulty walking or climbing stairs?: No Does the patient have difficulty dressing or bathing?: No Does the patient have difficulty doing errands alone such as visiting a doctor's office or shopping?: No   Assessment & Plan:    CPE completed today   . Prostate cancer screening and PSA options (with potential risks and  benefits of testing vs not testing) were discussed along with recent recs/guidelines, shared decision making and handout/information given to pt today  . USPSTF grade A and B recommendations reviewed with patient; age-appropriate recommendations, preventive care, screening tests, etc discussed and encouraged; healthy living encouraged; see AVS for patient education given to patient  . Discussed importance of 150 minutes of physical activity weekly, AHA exercise recommendations given to pt in AVS/handout  . Discussed importance of healthy diet:  eating lean meats and proteins, avoiding trans fats and saturated fats, avoid simple sugars and excessive carbs in diet, eat 6 servings of fruit/vegetables daily and drink plenty of water and  avoid sweet beverages.  DASH diet reviewed if pt has HTN  . Recommended pt to do annual eye exam and routine dental exams/cleanings  . Reviewed Health Maintenance: Health Maintenance  Topic Date Due  . INFLUENZA VACCINE  02/23/2020 (Originally 06/26/2019)  . TETANUS/TDAP  07/18/2027  . HIV Screening  Completed    . Immunizations: Immunization History  Administered Date(s) Administered  . Influenza Inj Mdck Quad With Preservative 09/12/2018  . Influenza,inj,Quad PF,6+ Mos 12/08/2015  . Influenza-Unspecified 08/20/2016  . Pneumococcal Polysaccharide-23 03/01/2016  . Tdap 07/17/2017     ICD-10-CM   1. Adult general medical exam  Z00.00 CBC with Differential/Platelet    COMPLETE METABOLIC PANEL WITH GFR    Lipid panel  2. Screening for lipoid disorders  Z13.220 CBC with Differential/Platelet    COMPLETE METABOLIC PANEL WITH GFR    Lipid panel  3. Screening for endocrine, metabolic and immunity disorder  Z13.29 CBC with Differential/Platelet   Z13.228 COMPLETE METABOLIC PANEL WITH GFR   Z13.0 Lipid panel    TSH  4. Abnormal TSH  R79.89 TSH   recheck  5. Benign prostatic hyperplasia with lower urinary tract symptoms, symptom details unspecified  N40.1    per urology due for f/up  6. Encounter for screening colonoscopy  Z12.11 Ambulatory referral to Gastroenterology  7. Scoliosis, unspecified scoliosis type, unspecified spinal region  M41.9 Ambulatory referral to Physical Therapy    Ambulatory referral to Spine Surgery       Danelle Berry, PA-C 12/29/19 3:31 PM  [facility]

## 2019-12-30 LAB — CBC WITH DIFFERENTIAL/PLATELET
Absolute Monocytes: 581 cells/uL (ref 200–950)
Basophils Absolute: 41 cells/uL (ref 0–200)
Basophils Relative: 0.9 %
Eosinophils Absolute: 122 cells/uL (ref 15–500)
Eosinophils Relative: 2.7 %
HCT: 47.9 % (ref 38.5–50.0)
Hemoglobin: 16 g/dL (ref 13.2–17.1)
Lymphs Abs: 1535 cells/uL (ref 850–3900)
MCH: 28.6 pg (ref 27.0–33.0)
MCHC: 33.4 g/dL (ref 32.0–36.0)
MCV: 85.5 fL (ref 80.0–100.0)
MPV: 10.3 fL (ref 7.5–12.5)
Monocytes Relative: 12.9 %
Neutro Abs: 2223 cells/uL (ref 1500–7800)
Neutrophils Relative %: 49.4 %
Platelets: 258 10*3/uL (ref 140–400)
RBC: 5.6 10*6/uL (ref 4.20–5.80)
RDW: 12.2 % (ref 11.0–15.0)
Total Lymphocyte: 34.1 %
WBC: 4.5 10*3/uL (ref 3.8–10.8)

## 2019-12-30 LAB — COMPLETE METABOLIC PANEL WITH GFR
AG Ratio: 1.5 (calc) (ref 1.0–2.5)
ALT: 22 U/L (ref 9–46)
AST: 19 U/L (ref 10–40)
Albumin: 4.1 g/dL (ref 3.6–5.1)
Alkaline phosphatase (APISO): 51 U/L (ref 36–130)
BUN: 13 mg/dL (ref 7–25)
CO2: 26 mmol/L (ref 20–32)
Calcium: 9.4 mg/dL (ref 8.6–10.3)
Chloride: 106 mmol/L (ref 98–110)
Creat: 1.24 mg/dL (ref 0.60–1.35)
GFR, Est African American: 79 mL/min/{1.73_m2} (ref >=60)
GFR, Est Non African American: 68 mL/min/{1.73_m2} (ref >=60)
Globulin: 2.8 g/dL (calc) (ref 1.9–3.7)
Glucose, Bld: 84 mg/dL (ref 65–99)
Potassium: 4.5 mmol/L (ref 3.5–5.3)
Sodium: 138 mmol/L (ref 135–146)
Total Bilirubin: 0.4 mg/dL (ref 0.2–1.2)
Total Protein: 6.9 g/dL (ref 6.1–8.1)

## 2019-12-30 LAB — LIPID PANEL
Cholesterol: 160 mg/dL (ref <200)
HDL: 57 mg/dL (ref >=40)
LDL Cholesterol (Calc): 90 mg/dL (calc)
Non-HDL Cholesterol (Calc): 103 mg/dL (calc) (ref <130)
Total CHOL/HDL Ratio: 2.8 (calc) (ref <5.0)
Triglycerides: 44 mg/dL (ref <150)

## 2019-12-30 LAB — TSH: TSH: 0.44 mIU/L (ref 0.40–4.50)

## 2020-01-04 ENCOUNTER — Other Ambulatory Visit: Payer: Self-pay

## 2020-01-04 ENCOUNTER — Ambulatory Visit (INDEPENDENT_AMBULATORY_CARE_PROVIDER_SITE_OTHER): Payer: 59 | Admitting: Urology

## 2020-01-04 ENCOUNTER — Encounter: Payer: Self-pay | Admitting: Urology

## 2020-01-04 ENCOUNTER — Other Ambulatory Visit: Payer: Self-pay | Admitting: Family Medicine

## 2020-01-04 VITALS — BP 123/82 | HR 83 | Ht 72.0 in | Wt 183.0 lb

## 2020-01-04 DIAGNOSIS — N138 Other obstructive and reflux uropathy: Secondary | ICD-10-CM

## 2020-01-04 DIAGNOSIS — R1032 Left lower quadrant pain: Secondary | ICD-10-CM

## 2020-01-04 DIAGNOSIS — N401 Enlarged prostate with lower urinary tract symptoms: Secondary | ICD-10-CM

## 2020-01-04 DIAGNOSIS — G8929 Other chronic pain: Secondary | ICD-10-CM

## 2020-01-04 NOTE — Progress Notes (Signed)
12/16/2018  3:21 PM   Joseph Strong 1971-05-29 [id number]  Referring provider: Arnetha Courser, MD [address] [place],  [place] [address]  Chief Complaint  Patient presents with  . Benign Prostatic Hypertrophy    HPI: Joseph Strong is a 49 y.o. male with BPH with LU TS, chronic pain and dysuria who presents for a IPSS, PSA and exam.  BPH WITH LUTS  (prostate and/or bladder) IPSS score: 9/0   Previous score: 11/1   Previous PVR: 0 mL  Major complaint(s):  Nocturia x 2-3 for a few years.  Denies any dysuria, hematuria or suprapubic pain.   Cystoscopy performed on 09/16/2017 with Dr. Tresa Moore was negative.    Denies any recent fevers, chills, nausea or vomiting.  He does not have a family history of PCa.  IPSS    Row Name 01/04/20 1500         International Prostate Symptom Score   How often have you had the sensation of not emptying your bladder?  Less than 1 in 5     How often have you had to urinate less than every two hours?  Less than 1 in 5 times     How often have you found you stopped and started again several times when you urinated?  Less than half the time     How often have you found it difficult to postpone urination?  Less than 1 in 5 times     How often have you had a weak urinary stream?  Less than 1 in 5 times     How often have you had to strain to start urination?  Less than 1 in 5 times     How many times did you typically get up at night to urinate?  2 Times     Total IPSS Score  9       Quality of Life due to urinary symptoms   If you were to spend the rest of your life with your urinary condition just the way it is now how would you feel about that?  Delighted        Score:  1-7 Mild 8-19 Moderate 20-35 Severe   Chronic pelvic pain/dysuria CT Renal stone study performed on 09/03/2017 noted small recurrent ventral hernia at the site of prior repair, with a small (less than 4 cubic cm) volume of herniated omental adipose tissue.   Findings in both inguinal regions suggesting prior inguinal hernia repairs. Dextroconvex thoracic scoliosis and mild levoconvex lumbar scoliosis.  Prominent stool throughout the colon favors constipation.  A specific cause for the patient's left lower abdominal/groin pain and flank pain is not identified.  Cysto 08/2017 NED.  He has not had his colonoscopy due to transportation issues.  He drinks mainly [place] energy drink and Pepsi's.  He is asymptomatic at this visit.   PMH: Past Medical History:  Diagnosis Date  . Burning with urination 01-10-16  . Constipation   . Dysrhythmia    "OCCASSIONAL SPEEDING UP OF HEART RATE" PT THINKING IT MAY BE DUE TO HERNIA  . Headache    MIGRAINES  . Inguinal hernia    left     Surgical History: Past Surgical History:  Procedure Laterality Date  . HERNIA REPAIR Left [id number]   AND UMBILICAL  . HERNIA REPAIR     49 years of age  . INGUINAL HERNIA REPAIR Right 01/11/2016   Procedure: HERNIA REPAIR INGUINAL ADULT;  Surgeon: Christene Lye, MD;  Location: [facility] ORS;  Service: General;  Laterality: Right;  . TONSILLECTOMY     childhood    Home Medications:  Allergies as of 01/04/2020   No Known Allergies     Medication List       Accurate as of January 04, 2020  3:21 PM. If you have any questions, ask your nurse or doctor.        acetaminophen 500 MG tablet Commonly known as: TYLENOL Take 500 mg by mouth every 6 (six) hours as needed for mild pain.   multivitamin tablet Take 1 tablet by mouth daily.       Allergies: No Known Allergies  Family History: Family History  Problem Relation Age of Onset  . Hypertension Maternal Grandmother   . Prostate cancer Neg Hx   . Kidney cancer Neg Hx   . Bladder Cancer Neg Hx     Social History:  reports that he has been smoking cigars and cigarettes. He has never used smokeless tobacco. He reports current alcohol use. He reports that he does not use drugs.  ROS: UROLOGY Frequent  Urination?: No Hard to postpone urination?: No Burning/pain with urination?: No Get up at night to urinate?: Yes Leakage of urine?: No Urine stream starts and stops?: No Trouble starting stream?: No Do you have to strain to urinate?: No Blood in urine?: No Urinary tract infection?: No Sexually transmitted disease?: No Injury to kidneys or bladder?: No Painful intercourse?: No Weak stream?: No Erection problems?: No Penile pain?: No  Gastrointestinal Nausea?: Yes Vomiting?: No Indigestion/heartburn?: Yes Diarrhea?: No Constipation?: Yes  Constitutional Fever: No Night sweats?: No Weight loss?: No Fatigue?: Yes  Skin Skin rash/lesions?: Yes Itching?: No  Eyes Blurred vision?: No Double vision?: No  Ears/Nose/Throat Sore throat?: No Sinus problems?: No  Hematologic/Lymphatic Swollen glands?: No Easy bruising?: No  Cardiovascular Leg swelling?: No Chest pain?: No  Respiratory Cough?: No Shortness of breath?: No  Endocrine Excessive thirst?: No  Musculoskeletal Back pain?: No Joint pain?: No  Neurological Headaches?: Yes Dizziness?: Yes  Psychologic Depression?: No Anxiety?: No  Physical Exam: BP 123/82   Pulse 83   Ht 6' (1.829 m)   Wt 183 lb (83 kg)   BMI 24.82 kg/m   Constitutional:  Well nourished. Alert and oriented, No acute distress. HEENT: [place] AT, mask in place.  Trachea midline, no masses. Cardiovascular: No clubbing, cyanosis, or edema. Respiratory: Normal respiratory effort, no increased work of breathing. GI: Abdomen is soft, non tender, non distended, no abdominal masses. Liver and spleen not palpable.  No hernias appreciated.  Stool sample for occult testing is not indicated.   GU: No CVA tenderness.  No bladder fullness or masses.  Patient with circumcised phallus.   Urethral meatus is patent.  No penile discharge. No penile lesions or rashes. Scrotum without lesions, cysts, rashes and/or edema.  Testicles are located scrotally  bilaterally. No masses are appreciated in the testicles. Left and right epididymis are normal. Rectal: Patient with  normal sphincter tone. Anus and perineum without scarring or rashes. No rectal masses are appreciated. Prostate is approximately 60 + grams, could only palpate the apex and the midportion of the gland, no nodules are appreciated. Seminal vesicles could not be palpated.  Skin: No rashes, bruises or suspicious lesions. Lymph: No inguinal adenopathy. Neurologic: Grossly intact, no focal deficits, moving all 4 extremities. Psychiatric: Normal mood and affect.  Laboratory Data: Lab Results  Component Value Date   WBC 4.5 12/29/2019   HGB 16.0 12/29/2019  HCT 47.9 12/29/2019   MCV 85.5 12/29/2019   PLT 258 12/29/2019    Lab Results  Component Value Date   CREATININE 1.24 12/29/2019    Lab Results  Component Value Date   HGBA1C 5.4 11/07/2016    Lab Results  Component Value Date   AST 19 12/29/2019   Lab Results  Component Value Date   ALT 22 12/29/2019   Component     Latest Ref Rng & Units 09/20/2015 04/08/2017 04/09/2018 12/16/2018  Prostate Specific Ag, Serum     0.0 - 4.0 ng/mL 0.8 0.8 1.1 0.8   Component     Latest Ref Rng & Units 01/04/2020  Prostate Specific Ag, Serum     0.0 - 4.0 ng/mL 0.8   I have reviewed the labs.   Pertinent Imaging: Results for [facility]" (MRN [id number]) as of 05/06/2018 14:58  Ref. Range 05/06/2018 14:36  Scan Result Unknown 0    Assessment & Plan:    1. BPH with LUTS - IPSS score is 9/0, it is improving - Continue conservative management, avoiding bladder irritants and timed voiding's - Most bothersome symptoms are frequency and nocturia, but he does not want any medications or surgery to address this at this time  - PSA today - RTC in 1 year for I PSS, PSA and exam - if PSA is stable   2. Chronic pelvic pain Asymptomatic at this visit    Return for IPSS, PSA and exam.  These notes generated with  voice recognition software. I apologize for typographical errors.  Michiel Cowboy, PA-C  [facility] [address] [place], [place] [address] [phone]

## 2020-01-05 ENCOUNTER — Telehealth: Payer: Self-pay | Admitting: Family Medicine

## 2020-01-05 LAB — PSA: Prostate Specific Ag, Serum: 0.8 ng/mL (ref 0.0–4.0)

## 2020-01-05 NOTE — Telephone Encounter (Signed)
Unable to leave message no voice mail set up. 

## 2020-01-05 NOTE — Telephone Encounter (Signed)
-----   Message from Harle Battiest, PA-C sent at 01/05/2020  8:18 AM EST ----- Please let Joseph Strong know that his PSA is stable at 0.8.

## 2020-01-19 ENCOUNTER — Encounter: Payer: Self-pay | Admitting: *Deleted

## 2020-02-28 ENCOUNTER — Telehealth: Payer: Self-pay | Admitting: Gastroenterology

## 2020-02-29 NOTE — Telephone Encounter (Signed)
Error

## 2020-03-02 ENCOUNTER — Ambulatory Visit: Payer: 59

## 2020-03-02 ENCOUNTER — Encounter: Payer: Self-pay | Admitting: *Deleted

## 2020-03-03 ENCOUNTER — Ambulatory Visit (INDEPENDENT_AMBULATORY_CARE_PROVIDER_SITE_OTHER): Payer: Self-pay | Admitting: Gastroenterology

## 2020-03-03 DIAGNOSIS — Z1211 Encounter for screening for malignant neoplasm of colon: Secondary | ICD-10-CM

## 2020-03-03 NOTE — Progress Notes (Signed)
Colonoscopy triage completed.  Patient has had two previous no shows in the past due to transportation.  I asked him if he has someone he can depend on to bring him for his colonoscopy.  He does not have anyone readily available.  I've informed him that I will send him the instructions for his colonoscopy along with the rx for the bowel prep, and he can call me back to schedule a date after he arranges transportation.  He appreciated this.  Gastroenterology Pre-Procedure Review  Request Date: PENDING Requesting Physician: Dr. TO BE DETERMINED  PATIENT REVIEW QUESTIONS: The patient responded to the following health history questions as indicated:    1. Are you having any GI issues? no 2. Do you have a personal history of Polyps? no 3. Do you have a family history of Colon Cancer or Polyps? no 4. Diabetes Mellitus? no 5. Joint replacements in the past 12 months?no 6. Major health problems in the past 3 months?no 7. Any artificial heart valves, MVP, or defibrillator?no    MEDICATIONS & ALLERGIES:    Patient reports the following regarding taking any anticoagulation/antiplatelet therapy:   Plavix, Coumadin, Eliquis, Xarelto, Lovenox, Pradaxa, Brilinta, or Effient? no Aspirin? no  Patient confirms/reports the following medications:  Current Outpatient Medications  Medication Sig Dispense Refill  . acetaminophen (TYLENOL) 500 MG tablet Take 500 mg by mouth every 6 (six) hours as needed for mild pain.    . Multiple Vitamin (MULTIVITAMIN) tablet Take 1 tablet by mouth daily.     No current facility-administered medications for this visit.    Patient confirms/reports the following allergies:  No Known Allergies  No orders of the defined types were placed in this encounter.   AUTHORIZATION INFORMATION Primary Insurance: 1D#: Group #:  Secondary Insurance: 1D#: Group #:  SCHEDULE INFORMATION: Date: PENDING Time: Location:[facility]

## 2020-03-07 ENCOUNTER — Encounter: Payer: Self-pay | Admitting: *Deleted

## 2020-07-07 ENCOUNTER — Emergency Department
Admission: EM | Admit: 2020-07-07 | Discharge: 2020-07-07 | Disposition: A | Discharge status: Home / Self Care | Payer: 59 | Source: Home / Self Care | Attending: Emergency Medicine | Admitting: Emergency Medicine

## 2020-07-07 ENCOUNTER — Emergency Department
Admission: EM | Admit: 2020-07-07 | Discharge: 2020-07-07 | Disposition: A | Discharge status: Home / Self Care | Payer: 59 | Attending: Emergency Medicine | Admitting: Emergency Medicine

## 2020-07-07 ENCOUNTER — Other Ambulatory Visit: Payer: Self-pay

## 2020-07-07 DIAGNOSIS — R112 Nausea with vomiting, unspecified: Secondary | ICD-10-CM | POA: Insufficient documentation

## 2020-07-07 DIAGNOSIS — Z720 Tobacco use: Secondary | ICD-10-CM | POA: Insufficient documentation

## 2020-07-07 DIAGNOSIS — R110 Nausea: Secondary | ICD-10-CM | POA: Insufficient documentation

## 2020-07-07 DIAGNOSIS — T881XXA Other complications following immunization, not elsewhere classified, initial encounter: Secondary | ICD-10-CM

## 2020-07-07 DIAGNOSIS — R42 Dizziness and giddiness: Secondary | ICD-10-CM | POA: Diagnosis not present

## 2020-07-07 DIAGNOSIS — Z79899 Other long term (current) drug therapy: Secondary | ICD-10-CM | POA: Insufficient documentation

## 2020-07-07 DIAGNOSIS — K429 Umbilical hernia without obstruction or gangrene: Secondary | ICD-10-CM

## 2020-07-07 DIAGNOSIS — Z5321 Procedure and treatment not carried out due to patient leaving prior to being seen by health care provider: Secondary | ICD-10-CM | POA: Diagnosis not present

## 2020-07-07 DIAGNOSIS — M791 Myalgia, unspecified site: Secondary | ICD-10-CM | POA: Insufficient documentation

## 2020-07-07 LAB — BASIC METABOLIC PANEL
Anion gap: 8 (ref 5–15)
BUN: 11 mg/dL (ref 6–20)
CO2: 23 mmol/L (ref 22–32)
Calcium: 9.1 mg/dL (ref 8.9–10.3)
Chloride: 105 mmol/L (ref 98–111)
Creatinine, Ser: 1.09 mg/dL (ref 0.61–1.24)
GFR calc Af Amer: >60 mL/min (ref >60)
GFR calc non Af Amer: >60 mL/min (ref >60)
Glucose, Bld: 117 mg/dL — ABNORMAL HIGH (ref 70–99)
Potassium: 3.4 mmol/L — ABNORMAL LOW (ref 3.5–5.1)
Sodium: 136 mmol/L (ref 135–145)

## 2020-07-07 LAB — URINALYSIS, COMPLETE (UACMP) WITH MICROSCOPIC
Bacteria, UA: NONE SEEN
Bilirubin Urine: NEGATIVE
Glucose, UA: NEGATIVE mg/dL
Hgb urine dipstick: NEGATIVE
Ketones, ur: 5 mg/dL — AB
Leukocytes,Ua: NEGATIVE
Nitrite: NEGATIVE
Protein, ur: NEGATIVE mg/dL
Specific Gravity, Urine: 1.026 (ref 1.005–1.030)
Squamous Epithelial / HPF: NONE SEEN (ref 0–5)
pH: 5 (ref 5.0–8.0)

## 2020-07-07 LAB — CBC
HCT: 47.4 % (ref 39.0–52.0)
Hemoglobin: 16.5 g/dL (ref 13.0–17.0)
MCH: 29 pg (ref 26.0–34.0)
MCHC: 34.8 g/dL (ref 30.0–36.0)
MCV: 83.5 fL (ref 80.0–100.0)
Platelets: 246 10*3/uL (ref 150–400)
RBC: 5.68 MIL/uL (ref 4.22–5.81)
RDW: 13.4 % (ref 11.5–15.5)
WBC: 4.6 10*3/uL (ref 4.0–10.5)
nRBC: 0 % (ref 0.0–0.2)

## 2020-07-07 NOTE — Discharge Instructions (Signed)
Your labs look good. No medications needed.  Follow up with the surgeon for your hernia concerns.

## 2020-07-07 NOTE — ED Triage Notes (Signed)
Patient reports received 2nd covid vaccine on 8/10.  Reports bodyaches, vomiting and chills.  Reports continued symptoms.

## 2020-07-07 NOTE — ED Provider Notes (Signed)
[facility] Emergency Department Provider Note  ____________________________________________  Time seen: Approximately 11:13 PM  I have reviewed the triage vital signs and the nursing notes.   HISTORY  Chief Complaint Medication Reaction   HPI Joseph Strong is a 49 y.o. male presenting to the emergency department for treatment and evaluation of body aches, chills, and several episodes of vomiting after receiving his second Covid vaccination.  He is also concerned about periumbilical hernia that seems to have gotten worse since the vaccination.  Past Medical History:  Diagnosis Date  . Burning with urination 01-10-16  . Constipation   . Dysrhythmia    "OCCASSIONAL SPEEDING UP OF HEART RATE" PT THINKING IT MAY BE DUE TO HERNIA  . Headache    MIGRAINES  . Inguinal hernia    left     Patient Active Problem List   Diagnosis Date Noted  . Pancreas divisum 10/09/2018  . Constipation 10/09/2018  . Ventral hernia without obstruction or gangrene 10/09/2018  . Prostate cancer screening 09/16/2017  . Abdominal pain, periumbilical 12/20/2016  . Decreased frequency of bowel movements 01/10/2016  . Left paraspinal back pain 12/08/2015  . Dizziness, nonspecific 10/31/2015  . Abnormal TSH 10/31/2015  . Chronic pain of left groin 10/31/2015  . Abnormal EKG 10/31/2015  . Testicular pain, left 09/20/2015  . S/P unilateral inguinal hernia repair 09/20/2015    Past Surgical History:  Procedure Laterality Date  . HERNIA REPAIR Left 1998   AND UMBILICAL  . HERNIA REPAIR     49 years of age  . INGUINAL HERNIA REPAIR Right 01/11/2016   Procedure: HERNIA REPAIR INGUINAL ADULT;  Surgeon: Kieth Brightly, MD;  Location: [facility] ORS;  Service: General;  Laterality: Right;  . TONSILLECTOMY     childhood    Prior to Admission medications   Medication Sig Start Date End Date Taking? Authorizing Provider  acetaminophen (TYLENOL) 500 MG tablet Take 500 mg by mouth  every 6 (six) hours as needed for mild pain.    [provider]  Multiple Vitamin (MULTIVITAMIN) tablet Take 1 tablet by mouth daily.    [provider]    Allergies Patient has no known allergies.  Family History  Problem Relation Age of Onset  . Hypertension Maternal Grandmother   . Prostate cancer Neg Hx   . Kidney cancer Neg Hx   . Bladder Cancer Neg Hx     Social History Social History   Tobacco Use  . Smoking status: Light Tobacco Smoker    Types: Cigars, Cigarettes  . Smokeless tobacco: Never Used  Vaping Use  . Vaping Use: Never used  Substance Use Topics  . Alcohol use: Yes    Alcohol/week: 0.0 standard drinks    Comment: occasional  . Drug use: No    Review of Systems Constitutional: Negative for fever. ENT: Negative for sore throat. Respiratory: Negative cough Gastrointestinal: No abdominal pain.  Positive for nausea and vomiting.  No diarrhea.  Musculoskeletal: Positive for generalized body aches. Skin: Negative for rash/lesion/wound. Neurological: Negative for headaches, focal weakness or numbness.  ____________________________________________   PHYSICAL EXAM:  VITAL SIGNS: ED Triage Vitals  Enc Vitals Group     BP 07/07/20 1949 (!) 126/94     Pulse Rate 07/07/20 1949 84     Resp 07/07/20 1949 18     Temp 07/07/20 1949 98.9 F (37.2 C)     Temp Source 07/07/20 1949 Oral     SpO2 07/07/20 1949 98 %  Weight 07/07/20 1950 182 lb 15.7 oz (83 kg)     Height 07/07/20 1950 6' (1.829 m)     Head Circumference --      Peak Flow --      Pain Score 07/07/20 1950 0     Pain Loc --      Pain Edu? --      Excl. in GC? --     Constitutional: Alert and oriented. Well appearing and in no acute distress. Eyes: Conjunctivae are normal. PERRL. EOMI. Head: Atraumatic. Nose: No congestion/rhinnorhea. Mouth/Throat: Mucous membranes are moist. Neck: No stridor.  Gastrointestinal: Palpable umbilical hernia without any indication of  strangulation.  Area is soft, normal skin color overlying the area, nontender to palpation. Cardiovascular: Normal rate, regular rhythm. Good peripheral circulation. Respiratory: Normal respiratory effort. Musculoskeletal: Full ROM throughout.  Neurologic:  Normal speech and language. No gross focal neurologic deficits are appreciated. Speech is normal. No gait instability. Skin:  Skin is warm, dry and intact. No rash noted. Psychiatric: Mood and affect are normal. Speech and behavior are normal.  ____________________________________________   LABS (all labs ordered are listed, but only abnormal results are displayed)  Labs Reviewed - No data to display ____________________________________________  EKG  Not indicated ____________________________________________  RADIOLOGY  Not indicated ____________________________________________   PROCEDURES  None ____________________________________________   INITIAL IMPRESSION / ASSESSMENT AND PLAN / ED COURSE   49 year old male presenting to the emergency department for evaluation after receiving his second Covid vaccination.  He had some symptoms of concern that resolved within 2 days of the vaccination.  Main concern tonight was being umbilical hernia.  He was afraid that the vomiting had caused some sort of "backup" that would make him sick.  His exam is very benign.  No indication of bowel obstruction or strangulated hernia.  Bowel sounds are active and present in all 4 quadrants.  Patient felt reassured.  He also had some labs drawn earlier today those were reviewed without any findings of concern.  He will be discharged home with a work excuse that allows him to return tomorrow.  Pertinent labs & imaging results that were available during my care of the patient were reviewed by me and considered in my medical decision making (see chart for details).  Patient was advised to follow-up with primary care provider of his choice for  symptoms of concern.  He was also given a referral to see the surgeon.. ____________________________________________   FINAL CLINICAL IMPRESSION(S) / ED DIAGNOSES  Final diagnoses:  Umbilical hernia without obstruction and without gangrene  Vaccination complication, initial encounter       Chinita Pester, FNP 07/07/20 2346    Shaune Pollack, MD 07/13/20 1202

## 2020-07-07 NOTE — ED Triage Notes (Signed)
Pt states that he received his sec moderna vaccine on tues 10th. Pt states he woke up the next day feeling bad, body aches, nausea, and dizziness, pt states that he cont to feel dizzy

## 2020-07-07 NOTE — ED Notes (Signed)
Pt provided with fluids PO and crackers, per request.

## 2020-07-07 NOTE — ED Notes (Signed)
Pt called for vital recheck x 1. Pt not seen in lobby or outside

## 2020-07-10 NOTE — Progress Notes (Deleted)
12/16/2018  12:18 PM   Joseph Strong 1971-04-10 [id number]  Referring provider: Danelle Berry, PA-C [address] [place],  [place] [address]  No chief complaint on file.   HPI: Joseph Strong is a 49 y.o. male with BPH with LU TS, chronic pain and dysuria who presents for a IPSS, PSA and exam.  BPH WITH LUTS  (prostate and/or bladder) IPSS score: ***        Previous score: 9/0   Previous PVR: 0 mL  Major complaint(s):  Nocturia x 2-3 for a few years.  Denies any dysuria, hematuria or suprapubic pain.   Cystoscopy performed on 09/16/2017 with Dr. Berneice Heinrich was negative.    Denies any recent fevers, chills, nausea or vomiting.  He does not have a family history of PCa.    Score:  1-7 Mild 8-19 Moderate 20-35 Severe   Chronic pelvic pain/dysuria CT Renal stone study performed on 09/03/2017 noted small recurrent ventral hernia at the site of prior repair, with a small (less than 4 cubic cm) volume of herniated omental adipose tissue.  Findings in both inguinal regions suggesting prior inguinal hernia repairs. Dextroconvex thoracic scoliosis and mild levoconvex lumbar scoliosis.  Prominent stool throughout the colon favors constipation.  A specific cause for the patient's left lower abdominal/groin pain and flank pain is not identified.  Cysto 08/2017 NED.  He has not had his colonoscopy due to transportation issues.  He drinks mainly [place] energy drink and Pepsi's.    PMH: Past Medical History:  Diagnosis Date  . Burning with urination 01-10-16  . Constipation   . Dysrhythmia    "OCCASSIONAL SPEEDING UP OF HEART RATE" PT THINKING IT MAY BE DUE TO HERNIA  . Headache    MIGRAINES  . Inguinal hernia    left     Surgical History: Past Surgical History:  Procedure Laterality Date  . HERNIA REPAIR Left 1998   AND UMBILICAL  . HERNIA REPAIR     49 years of age  . INGUINAL HERNIA REPAIR Right 01/11/2016   Procedure: HERNIA REPAIR INGUINAL ADULT;  Surgeon:  Kieth Brightly, MD;  Location: [facility] ORS;  Service: General;  Laterality: Right;  . TONSILLECTOMY     childhood    Home Medications:  Allergies as of 07/11/2020   No Known Allergies     Medication List       Accurate as of July 10, 2020 12:18 PM. If you have any questions, ask your nurse or doctor.        acetaminophen 500 MG tablet Commonly known as: TYLENOL Take 500 mg by mouth every 6 (six) hours as needed for mild pain.   multivitamin tablet Take 1 tablet by mouth daily.       Allergies: No Known Allergies  Family History: Family History  Problem Relation Age of Onset  . Hypertension Maternal Grandmother   . Prostate cancer Neg Hx   . Kidney cancer Neg Hx   . Bladder Cancer Neg Hx     Social History:  reports that he has been smoking cigars and cigarettes. He has never used smokeless tobacco. He reports current alcohol use. He reports that he does not use drugs.  ROS: For pertinent review of systems please refer to history of present illness  Physical Exam: There were no vitals taken for this visit.  Constitutional:  Well nourished. Alert and oriented, No acute distress. HEENT: [place] AT, moist mucus membranes.  Trachea midline Cardiovascular: No clubbing, cyanosis, or edema.  Respiratory: Normal respiratory effort, no increased work of breathing. GI: Abdomen is soft, non tender, non distended, no abdominal masses. Liver and spleen not palpable.  No hernias appreciated.  Stool sample for occult testing is not indicated.   GU: No CVA tenderness.  No bladder fullness or masses.  Patient with circumcised/uncircumcised phallus. ***Foreskin easily retracted***  Urethral meatus is patent.  No penile discharge. No penile lesions or rashes. Scrotum without lesions, cysts, rashes and/or edema.  Testicles are located scrotally bilaterally. No masses are appreciated in the testicles. Left and right epididymis are normal. Rectal: Patient with  normal sphincter tone. Anus  and perineum without scarring or rashes. No rectal masses are appreciated. Prostate is approximately *** grams, *** nodules are appreciated. Seminal vesicles are normal. Skin: No rashes, bruises or suspicious lesions. Lymph: No inguinal adenopathy. Neurologic: Grossly intact, no focal deficits, moving all 4 extremities. Psychiatric: Normal mood and affect.  Laboratory Data: Lab Results  Component Value Date   WBC 4.6 07/07/2020   HGB 16.5 07/07/2020   HCT 47.4 07/07/2020   MCV 83.5 07/07/2020   PLT 246 07/07/2020    Lab Results  Component Value Date   CREATININE 1.09 07/07/2020    Lab Results  Component Value Date   HGBA1C 5.4 11/07/2016    Lab Results  Component Value Date   AST 19 12/29/2019   Lab Results  Component Value Date   ALT 22 12/29/2019   Component     Latest Ref Rng & Units 09/20/2015 04/08/2017 04/09/2018 12/16/2018  Prostate Specific Ag, Serum     0.0 - 4.0 ng/mL 0.8 0.8 1.1 0.8   Component     Latest Ref Rng & Units 01/04/2020  Prostate Specific Ag, Serum     0.0 - 4.0 ng/mL 0.8   I have reviewed the labs.   Pertinent Imaging: ***   Assessment & Plan:    1. BPH with LUTS - IPSS score is 9/0, it is improving - Continue conservative management, avoiding bladder irritants and timed voiding's - Most bothersome symptoms are frequency and nocturia, but he does not want any medications or surgery to address this at this time  - PSA today - RTC in 1 year for I PSS, PSA and exam - if PSA is stable   2. Chronic pelvic pain Asymptomatic at this visit    No follow-ups on file.  These notes generated with voice recognition software. I apologize for typographical errors.  Michiel Cowboy, PA-C  [facility] [address] [place], [place] [address] [phone]

## 2020-07-11 ENCOUNTER — Telehealth: Payer: Self-pay | Admitting: Urology

## 2020-07-11 ENCOUNTER — Ambulatory Visit: Payer: 59 | Admitting: Urology

## 2020-07-11 NOTE — Telephone Encounter (Signed)
Pt called to cancel appt scheduled for today, pt also asks for Rx for 2 medications discussed at his last visit to be called in to [facility] on [address]. Pt does not remember name of medication.  Please advise.

## 2020-07-13 ENCOUNTER — Other Ambulatory Visit: Payer: Self-pay | Admitting: Urology

## 2020-07-13 MED ORDER — MIRABEGRON ER 25 MG PO TB24
25.0000 mg | ORAL_TABLET | Freq: Every day | ORAL | 0 refills | Status: DC
Start: 2020-07-13 — End: 2021-02-12

## 2020-07-13 NOTE — Progress Notes (Signed)
I have sent in a script for Myrbetriq 25 mg daily, but I will need to see him in one month for follow up.   Unable to leave message, voicemail has not been set up.

## 2020-07-17 ENCOUNTER — Other Ambulatory Visit: Payer: Self-pay

## 2020-07-17 ENCOUNTER — Ambulatory Visit (INDEPENDENT_AMBULATORY_CARE_PROVIDER_SITE_OTHER): Payer: 59 | Admitting: Surgery

## 2020-07-17 ENCOUNTER — Encounter: Payer: Self-pay | Admitting: Surgery

## 2020-07-17 DIAGNOSIS — R109 Unspecified abdominal pain: Secondary | ICD-10-CM

## 2020-07-17 NOTE — Progress Notes (Signed)
07/17/2020  History of Present Illness: Joseph Strong is a 49 y.o. male presenting for follow-up of abdominal pain.  Patient has a history of open umbilical hernia and left inguinal hernia repair in 1998.  He is also status post right inguinal hernia repair in 2017 with Dr. Evette Cristal.  He was seen last July 2019 by Dr. Excell Seltzer but his exam did not seem to suggest a recurrence of his umbilical hernia or left inguinal hernia.  Patient did not follow-up further and he now presents because of continued abdominal pain.  He also has a history of constipation and his last CT scan in 2018 shows considerable stool burden in his colon.  Patient reports that he still having abdominal pain that varies in location and radiates from his umbilical area to bilateral groins or from the groin to the side and then to his back.  Denies any nausea or vomiting.  Reports some constipation still and was constipated last week but the last few days has been having daily bowel movements.  Past Medical History: Past Medical History:  Diagnosis Date  . Burning with urination 01-10-16  . Constipation   . Dysrhythmia    "OCCASSIONAL SPEEDING UP OF HEART RATE" PT THINKING IT MAY BE DUE TO HERNIA  . Headache    MIGRAINES  . Inguinal hernia    left      Past Surgical History: Past Surgical History:  Procedure Laterality Date  . HERNIA REPAIR Left 1998   AND UMBILICAL  . HERNIA REPAIR     49 years of age  . INGUINAL HERNIA REPAIR Right 01/11/2016   Procedure: HERNIA REPAIR INGUINAL ADULT;  Surgeon: Kieth Brightly, MD;  Location: [facility] ORS;  Service: General;  Laterality: Right;  . TONSILLECTOMY     childhood    Home Medications: Prior to Admission medications   Medication Sig Start Date End Date Taking? Authorizing Provider  acetaminophen (TYLENOL) 500 MG tablet Take 500 mg by mouth every 6 (six) hours as needed for mild pain.   Yes [provider]  mirabegron ER (MYRBETRIQ) 25 MG TB24 tablet Take 1  tablet (25 mg total) by mouth daily. 07/13/20  Yes McGowan, Carollee Herter A, PA-C  Multiple Vitamin (MULTIVITAMIN) tablet Take 1 tablet by mouth daily.   Yes [provider]    Allergies: No Known Allergies  Review of Systems: Review of Systems  Constitutional: Negative for chills and fever.  Respiratory: Negative for shortness of breath.   Cardiovascular: Negative for chest pain.  Gastrointestinal: Positive for abdominal pain and constipation. Negative for diarrhea, nausea and vomiting.  Skin: Negative for rash.    Physical Exam BP 123/88   Pulse 77   Temp 98 F (36.7 C) (Oral)   Ht 6' (1.829 m)   Wt 181 lb 6.4 oz (82.3 kg)   SpO2 97%   BMI 24.60 kg/m  CONSTITUTIONAL: No acute distress HEENT:  Normocephalic, atraumatic, extraocular motion intact. RESPIRATORY:  Lungs are clear, and breath sounds are equal bilaterally. Normal respiratory effort without pathologic use of accessory muscles. CARDIOVASCULAR: Heart is regular without murmurs, gallops, or rubs. GI: The abdomen is soft, nondistended, nontender to palpation.  Patient has well-healed scars from umbilical, right groin, and left groin hernias.  At the supraumbilical area the patient has what appears to be diastases recti rather than a true hernia defect.  I am unable to discern any recurrent hernias at the umbilicus itself, or either groin.   NEUROLOGIC:  Motor and sensation is grossly normal.  Cranial nerves are grossly intact. PSYCH:  Alert and oriented to person, place and time. Affect is normal.  Labs/Imaging: None recently  Assessment and Plan: This is a 49 y.o. male with intermittent abdominal pain that is somewhat diffuse and radiating from different places towards different places.  -Discussed with the patient that I am unable to discern a true hernia defect in any of the 3 areas of prior repair.  I think for now it would be best for [place] to order a repeat CT scan of the abdomen and pelvis with contrast to  evaluate the anatomy better.  I will call him with the results.  If any findings come up from the CT scan that would require surgery, I will have the patient follow-up with [place] in the office to further discuss any surgical plans.  Face-to-face time spent with the patient and care providers was 25 minutes, with more than 50% of the time spent counseling, educating, and coordinating care of the patient.     [name], MD [facility]

## 2020-07-17 NOTE — Patient Instructions (Addendum)
Patient is scheduled for CT Abdomen Pelvis with Contrast on September 9th, 2021 at 12:30pm. Patient instruction patient is to arrive at 12:15pm. Patient is NOT to have anything to eat or drink 4 hours prior. Dr.Piscoya will contact patient with imaging results. [facility] [address] Joseph Strong [place], [place] [address] [phone]  Diastasis Recti  Diastasis recti is when the muscles of the abdomen (rectus abdominis muscles) become thin and separate. The result is a wider space between the right and left abdomen (abdominal) muscles. This wider space between the muscles may cause a bulge in the middle of your abdomen. You may notice this bulge when you are straining or when you sit up from a lying down position. Diastasis recti can affect men and women. It is most common among pregnant women, infants, people who are obese, and people who have had abdominal surgery. Exercise or surgical treatment may help correct it. What are the causes? Common causes of this condition include:  Pregnancy. The growing uterus puts pressure on the abdominal muscles, which causes the muscles to separate.  Obesity. Excess fat puts pressure on abdominal muscles.  Weightlifting.  Some abdomen exercises.  Advanced age.  Genetics.  Prior abdominal surgery. What increases the risk? This condition is more likely to develop in:  Women.  Newborns, especially newborns who are born early (prematurely). What are the signs or symptoms? Common symptoms of this condition include:  A bulge in the middle of the abdomen. You will notice it most when you sit up or strain.  Pain in the low back, pelvis, or hips.  Constipation.  Inability to control when you urinate (urinary incontinence).  Bloating.  Poor posture. How is this diagnosed? This condition is diagnosed with a physical exam. Your health care provider will ask you to lie flat on your back and do a crunch or half  sit-up. If you have diastasis recti, a vertical bulge will appear between your abdominal muscles in the center of your abdomen. Your health care provider will measure the gap between your muscles with one of the following:  A medical device used to measure the space between two objects (caliper).  A tape measure.  CT scan.  Ultrasound.  Finger spaces. Your health care provider will measure the space using their fingers. How is this treated? If your muscle separation is not too large, you may not need treatment. However, if you are a woman who plans to become pregnant again, you should treat this condition before your next pregnancy. Treatment may include:  Physical therapy to strengthen and tighten your abdominal muscles.  Lifestyle changes such as weight loss and exercise.  Over-the-counter pain medicines as needed.  Surgery to correct the separation. Follow these instructions at home: Activity  Return to your normal activities as told by your health care provider. Ask your health care provider what activities are safe for you.  When lifting weights or doing exercises using your abdominal muscles or the muscles in the center of your body that give stability (core muscles), make sure you are doing your exercises and movements correctly. Proper form can help to prevent the condition from happening again. General instructions  If you are overweight, ask your health care provider for help with weight loss. Losing even a small amount of weight can help to improve your diastasis recti.  Take over-the-counter or prescription medicines only as told by your health care provider.  Do not strain. Straining can make the separation worse. Examples  of straining include: ? Pushing hard to have a bowel movement, such as due to constipation. ? Lifting heavy objects, including children. ? Standing up and sitting down.  Take steps to prevent constipation: ? Drink enough fluid to keep your urine  clear or pale yellow. ? Take over-the-counter or prescription medicines only as directed. ? Eat foods that are high in fiber, such as fresh fruits and vegetables, whole grains, and beans. ? Limit foods that are high in fat and processed sugars, such as fried and sweet foods. Contact a health care provider if:  You notice a new bulge in your abdomen. Get help right away if:  You experience severe discomfort in your abdomen.  You develop severe abdominal pain along with nausea, vomiting, or fever. Summary  Diastasis recti is when the abdomen (abdominal) muscles become thin and separate. Your abdomen will stick out because the space between your right and left abdomen muscles has widened.  The most common symptom is a bulge in your abdomen. You will notice it most when you sit up or are straining.  This condition is diagnosed during a physical exam.  If the abdomen separation is not too big, you may choose not to have treatment. Otherwise, you may need to undergo physical therapy or surgery. This information is not intended to replace advice given to you by your health care provider. Make sure you discuss any questions you have with your health care provider. Document Revised: 10/24/2017 Document Reviewed: 01/06/2017 Elsevier Patient Education  2020 [employer].

## 2020-07-18 NOTE — Progress Notes (Signed)
Tried to call patient again no answer 

## 2020-08-03 ENCOUNTER — Ambulatory Visit: Payer: 59

## 2020-08-07 ENCOUNTER — Ambulatory Visit
Admission: RE | Admit: 2020-08-07 | Discharge: 2020-08-07 | Disposition: A | Discharge status: Home / Self Care | Payer: 59 | Source: Ambulatory Visit | Attending: Surgery | Admitting: Surgery

## 2020-08-07 ENCOUNTER — Other Ambulatory Visit: Payer: Self-pay

## 2020-08-07 DIAGNOSIS — R109 Unspecified abdominal pain: Secondary | ICD-10-CM | POA: Insufficient documentation

## 2020-08-07 MED ORDER — IOHEXOL 300 MG/ML  SOLN
100.0000 mL | Freq: Once | INTRAMUSCULAR | Status: AC | PRN
  Administered 2020-08-07: 100 mL via INTRAVENOUS

## 2020-08-08 ENCOUNTER — Telehealth: Payer: Self-pay

## 2020-08-08 NOTE — Telephone Encounter (Signed)
-----   Message from Henrene Dodge, MD sent at 08/08/2020  2:33 PM EDT ----- Regarding: follow up Hi,  I saw this patient on 8/23 with abdominal pain.  Has a history of umbilical and left inguinal hernia repair back in 1998, and a right inguinal hernia repair in 2017.  He had a CT scan yesterday which shows a recurrent hernia just above the umbilicus.  I've tried to reach him yesterday and today multiple times to inform him of the results, but there's no answer and his voicemail is not set up.  Would y'all be able to try reaching him and set up an appointment so we can discuss surgery?  Thanks,  [employer]

## 2020-08-08 NOTE — Telephone Encounter (Signed)
Attempted to contact patient twice and unable to reach or leave a detailed message for the patient due to the voicemail box not being set-up.

## 2020-08-08 NOTE — Progress Notes (Signed)
08/08/20  CT scan images personally viewed and agree with findings.  He has prior umbilical hernia repair and appears to have a small recurrence just superior to it.  I have tried reaching the patient multiple times without answer and his voicemail is not set up.  Have asked our office to try reach him to set up an appointment to discuss the findings and schedule surgery.  Henrene Dodge, MD

## 2020-08-09 ENCOUNTER — Encounter: Payer: Self-pay | Admitting: Surgery

## 2020-08-09 ENCOUNTER — Telehealth: Payer: Self-pay | Admitting: *Deleted

## 2020-08-09 NOTE — Telephone Encounter (Signed)
Patient called back and is scheduled for Friday September 17th with Dr Aleen Campi

## 2020-08-11 ENCOUNTER — Ambulatory Visit: Payer: 59 | Admitting: Surgery

## 2020-08-14 ENCOUNTER — Other Ambulatory Visit: Payer: Self-pay

## 2020-08-14 ENCOUNTER — Encounter: Payer: Self-pay | Admitting: Emergency Medicine

## 2020-08-14 ENCOUNTER — Encounter: Payer: Self-pay | Admitting: Surgery

## 2020-08-14 ENCOUNTER — Ambulatory Visit (INDEPENDENT_AMBULATORY_CARE_PROVIDER_SITE_OTHER): Payer: 59 | Admitting: Surgery

## 2020-08-14 VITALS — BP 121/80 | HR 89 | Temp 98.4°F | Resp 12 | Ht 72.0 in | Wt 184.6 lb

## 2020-08-14 DIAGNOSIS — K432 Incisional hernia without obstruction or gangrene: Secondary | ICD-10-CM | POA: Diagnosis not present

## 2020-08-14 NOTE — Progress Notes (Signed)
08/14/2020  History of Present Illness: Joseph Strong is a 49 y.o. male presenting for follow up of abdominal pain.  He was last seen on 8/23 for abdominal pain around the umbilicus with radiation towards the groin areas.  CT scan was done on 9/13 which shows a small recurrence just superior to the umbilicus.  There is otherwise no hernia on either groin.  I have personally viewed the images and agree with the findings.  Today, he reports that he still has some pain issues, but he reports they're mainly focused on the lower abdomen and left groin area.  Denies any nausea or vomiting.  He reports that the pain sometimes radiates from low abdomen to the umbilical area, sometimes it's in the left side under the ribs, and sometimes it's at the umbilicus itself.  He does report some constipation.  Past Medical History: Past Medical History:  Diagnosis Date  . Burning with urination 01-10-16  . Constipation   . Dysrhythmia    "OCCASSIONAL SPEEDING UP OF HEART RATE" PT THINKING IT MAY BE DUE TO HERNIA  . Headache    MIGRAINES  . Inguinal hernia    left      Past Surgical History: Past Surgical History:  Procedure Laterality Date  . HERNIA REPAIR Left 1998   AND UMBILICAL  . HERNIA REPAIR     49 years of age  . INGUINAL HERNIA REPAIR Right 01/11/2016   Procedure: HERNIA REPAIR INGUINAL ADULT;  Surgeon: Kieth Brightly, MD;  Location: [facility] ORS;  Service: General;  Laterality: Right;  . TONSILLECTOMY     childhood    Home Medications: Prior to Admission medications   Medication Sig Start Date End Date Taking? Authorizing Provider  acetaminophen (TYLENOL) 500 MG tablet Take 500 mg by mouth every 6 (six) hours as needed for mild pain.    [provider]  mirabegron ER (MYRBETRIQ) 25 MG TB24 tablet Take 1 tablet (25 mg total) by mouth daily. 07/13/20   Michiel Cowboy A, PA-C  Multiple Vitamin (MULTIVITAMIN) tablet Take 1 tablet by mouth daily.    [provider]     Allergies: No Known Allergies  Review of Systems: Review of Systems  Constitutional: Negative for chills and fever.  Respiratory: Negative for shortness of breath.   Cardiovascular: Negative for chest pain.  Gastrointestinal: Positive for abdominal pain. Negative for nausea and vomiting.  Skin: Negative for rash.    Physical Exam BP 121/80   Pulse 89   Temp 98.4 F (36.9 C)   Resp 12   Ht 6' (1.829 m)   Wt 184 lb 9.6 oz (83.7 kg)   SpO2 98%   BMI 25.04 kg/m  CONSTITUTIONAL: No acute distress HEENT:  Normocephalic, atraumatic, extraocular motion intact. RESPIRATORY:  Lungs are clear, and breath sounds are equal bilaterally. Normal respiratory effort without pathologic use of accessory muscles. CARDIOVASCULAR: Heart is regular without murmurs, gallops, or rubs. GI: The abdomen is soft, non-distended, with some discomfort to palpation in the umbilical area and bilateral groin areas.  No hernia at the groin, and unable to deeply palpate around the umbilicus due to some discomfort.  NEUROLOGIC:  Motor and sensation is grossly normal.  Cranial nerves are grossly intact. PSYCH:  Alert and oriented to person, place and time. Affect is normal.  Labs/Imaging: CT scan abdomen/pelvis 08/07/20: IMPRESSION: 1. No acute abdominal/pelvic findings, mass lesions or adenopathy. 2. Surgical changes from prior anterior abdominal wall hernia repair. A small recurrent hernia is noted in the  midline. 3. Stable surgical changes from a right inguinal hernia repair. No recurrent hernia.  Assessment and Plan: This is a 49 y.o. male with a recurrent periumbilical hernia.  --Discussed with the patient that this small recurrence could be contributing to his symptoms, particularly if there is scar tissue in the surrounding area that could be pulling on it.  There is no other evidence of hernia at either groin.  Discussed with him that given the symptoms, we could proceed with incisional/umbilical  hernia repair.  The patient appears to be very hesitant about surgery, and is wondering if he has to have surgery, but also says that he does not want it to get worse.  Discussed with him the different types of hernias ranging from reducible to strangulated.  Unfortunately I cannot tell him if or when his hernia would become strangulated, but there's a risk of it.  I think at this point he's mostly just not sure about what to do.  Given that he's had these issues for a few years now, this seems to be a slow process. --Discussed with him also that some of his pain could be related to constipation.  His CT scan does show stool burden in the colon, which could potentially contribute to the pain being in different locations.  For now I think it's appropriate to start with MiraLax bowel regimen daily to help with his constipation.  He will then follow up with me in three months to check on his progress and his symptoms.  We can then determine if we should schedule surgery or not.  Patient agrees with this plan and understands it.  Return precautions given as well.  Face-to-face time spent with the patient and care providers was 25 minutes, with more than 50% of the time spent counseling, educating, and coordinating care of the patient.     [name], MD  [facility]

## 2020-08-14 NOTE — Patient Instructions (Addendum)
Begin taking miralax once a day. See your appointment below.   Call the office if you have any questions or concerns.   Hernia, Adult     A hernia happens when tissue inside your body pushes out through a weak spot in your belly muscles (abdominal wall). This makes a round lump (bulge). The lump may be:  In a scar from surgery that was done in your belly (incisional hernia).  Near your belly button (umbilical hernia).  In your groin (inguinal hernia). Your groin is the area where your leg meets your lower belly (abdomen). This kind of hernia could also be: ? In your scrotum, if you are male. ? In folds of skin around your vagina, if you are male.  In your upper thigh (femoral hernia).  Inside your belly (hiatal hernia). This happens when your stomach slides above the muscle between your belly and your chest (diaphragm). If your hernia is small and it does not cause pain, you may not need treatment. If your hernia is large or it causes pain, you may need surgery. Follow these instructions at home: Activity  Avoid stretching or overusing (straining) the muscles near your hernia. Straining can happen when you: ? Lift something heavy. ? Poop (have a bowel movement).  Do not lift anything that is heavier than 10 lb (4.5 kg), or the limit that you are told, until your doctor says that it is safe.  Use the strength of your legs when you lift something heavy. Do not use only your back muscles to lift. General instructions  Do these things if told by your doctor so you do not have trouble pooping (constipation): ? Drink enough fluid to keep your pee (urine) pale yellow. ? Eat foods that are high in fiber. These include fresh fruits and vegetables, whole grains, and beans. ? Limit foods that are high in fat and processed sugars. These include foods that are fried or sweet. ? Take medicine for trouble pooping.  When you cough, try to cough gently.  You may try to push your hernia in  by very gently pressing on it when you are lying down. Do not try to force the bulge back in if it will not push in easily.  If you are overweight, work with your doctor to lose weight safely.  Do not use any products that have nicotine or tobacco in them. These include cigarettes and e-cigarettes. If you need help quitting, ask your doctor.  If you will be having surgery (hernia repair), watch your hernia for changes in shape, size, or color. Tell your doctor if you see any changes.  Take over-the-counter and prescription medicines only as told by your doctor.  Keep all follow-up visits as told by your doctor. Contact a doctor if:  You get new pain, swelling, or redness near your hernia.  You poop fewer times in a week than normal.  You have trouble pooping.  You have poop (stool) that is more dry than normal.  You have poop that is harder or larger than normal. Get help right away if:  You have a fever.  You have belly pain that gets worse.  You feel sick to your stomach (nauseous).  You throw up (vomit).  Your hernia cannot be pushed in by very gently pressing on it when you are lying down. Do not try to force the bulge back in if it will not push in easily.  Your hernia: ? Changes in shape or size. ? Changes  color. ? Feels hard or it hurts when you touch it. These symptoms may represent a serious problem that is an emergency. Do not wait to see if the symptoms will go away. Get medical help right away. Call your local emergency services (911 in the [place] Summary  A hernia happens when tissue inside your body pushes out through a weak spot in the belly muscles. This creates a bulge.  If your hernia is small and it does not hurt, you may not need treatment. If your hernia is large or it hurts, you may need surgery.  If you will be having surgery, watch your hernia for changes in shape, size, or color. Tell your doctor about any changes. This information is not  intended to replace advice given to you by your health care provider. Make sure you discuss any questions you have with your health care provider. Document Revised: 03/04/2019 Document Reviewed: 08/13/2017 Elsevier Patient Education  2020 [employer].

## 2020-10-23 NOTE — Progress Notes (Signed)
12/16/2018  12:36 PM   [name] [birth year] [id number]  Referring provider: Danelle Berry, PA-C [address] [place],  [place] [address]  Chief Complaint  Patient presents with  . Benign Prostatic Hypertrophy    HPI: Joseph Strong is a 49 y.o. male with BPH with LU TS, chronic pain and dysuria who presents for a IPSS, PSA and exam.  BPH WITH LUTS  (prostate and/or bladder) IPSS score: 14/2   PVR: 2 mL    Previous score: 9/0   Previous PVR: 0 mL  Major complaint(s): Burning/painful urination and a weak/split urinary stream    Patient denies any modifying or aggravating factors.  Patient denies any gross hematuria, dysuria or suprapubic/flank pain.  Patient denies any fevers, chills, nausea or vomiting.   Cystoscopy performed on 09/16/2017 with Dr. Berneice Heinrich was negative.    He does not have a family history of PCa.   IPSS    Row Name 10/24/20 1500         International Prostate Symptom Score   How often have you had the sensation of not emptying your bladder? Less than half the time     How often have you had to urinate less than every two hours? Less than half the time     How often have you found you stopped and started again several times when you urinated? Less than half the time     How often have you found it difficult to postpone urination? Less than half the time     How often have you had a weak urinary stream? Less than half the time     How often have you had to strain to start urination? Less than half the time     How many times did you typically get up at night to urinate? 2 Times     Total IPSS Score 14           Quality of Life due to urinary symptoms   If you were to spend the rest of your life with your urinary condition just the way it is now how would you feel about that? Mostly Satisfied            Score:  1-7 Mild 8-19 Moderate 20-35 Severe   Chronic pelvic pain/dysuria Patient has been having issues with chronic pain for  several years.  Cysto 2018 NED.  He has had scrotal ultrasounds x2 that were NED.  He has had CT scans x2 that have been NED.  He had an MRI of the abdomen and pelvis which was NED.  He does have lumbar scoliosis as noted on one of the CT scans.  He has been advised to undergo colonoscopy which she has not been able to complete due to transportation issues.  We have also referred him to physical therapy, but he is having issues getting time off work.   PMH: Past Medical History:  Diagnosis Date  . Burning with urination 01-10-16  . Constipation   . Dysrhythmia    "OCCASSIONAL SPEEDING UP OF HEART RATE" PT THINKING IT MAY BE DUE TO HERNIA  . Headache    MIGRAINES  . Inguinal hernia    left     Surgical History: Past Surgical History:  Procedure Laterality Date  . HERNIA REPAIR Left 1998   AND UMBILICAL  . HERNIA REPAIR     49 years of age  . INGUINAL HERNIA REPAIR Right 01/11/2016   Procedure: HERNIA REPAIR INGUINAL ADULT;  Surgeon: Kieth Brightly, MD;  Location: [facility] ORS;  Service: General;  Laterality: Right;  . TONSILLECTOMY     childhood    Home Medications:  Allergies as of 10/24/2020   No Known Allergies     Medication List       Accurate as of October 24, 2020 11:59 PM. If you have any questions, ask your nurse or doctor.        acetaminophen 500 MG tablet Commonly known as: TYLENOL Take 500 mg by mouth every 6 (six) hours as needed for mild pain.   mirabegron ER 25 MG Tb24 tablet Commonly known as: MYRBETRIQ Take 1 tablet (25 mg total) by mouth daily.   multivitamin tablet Take 1 tablet by mouth daily.   tadalafil 5 MG tablet Commonly known as: CIALIS Take 1 tablet (5 mg total) by mouth daily as needed for erectile dysfunction. Started by: Michiel Cowboy, PA-C       Allergies: No Known Allergies  Family History: Family History  Problem Relation Age of Onset  . Hypertension Maternal Grandmother   . Prostate cancer Neg Hx   . Kidney  cancer Neg Hx   . Bladder Cancer Neg Hx     Social History:  reports that he has been smoking cigars and cigarettes. He has never used smokeless tobacco. He reports current alcohol use. He reports that he does not use drugs.  ROS: For pertinent review of systems please refer to history of present illness  Physical Exam: BP 118/87   Pulse 85   Ht 6' (1.829 m)   Wt 183 lb (83 kg)   BMI 24.82 kg/m   Constitutional:  Well nourished. Alert and oriented, No acute distress. HEENT: [place] AT, mask in place.  Trachea midline Cardiovascular: No clubbing, cyanosis, or edema. Respiratory: Normal respiratory effort, no increased work of breathing. GU: No CVA tenderness.  No bladder fullness or masses.  Patient with circumcised phallus. Urethral meatus is patent.  No penile discharge. No penile lesions or rashes. Scrotum without lesions, cysts, rashes and/or edema.  Testicles are located scrotally bilaterally. No masses are appreciated in the testicles. Left and right epididymis are normal. Rectal: Patient with  normal sphincter tone. Anus and perineum without scarring or rashes. No rectal masses are appreciated. Prostate is approximately 60 + grams, could only palpate the apex and midportion of the gland, no nodules are appreciated. Seminal vesicles could not be palpated Neurologic: Grossly intact, no focal deficits, moving all 4 extremities. Psychiatric: Normal mood and affect.  Laboratory Data: Lab Results  Component Value Date   WBC 4.6 07/07/2020   HGB 16.5 07/07/2020   HCT 47.4 07/07/2020   MCV 83.5 07/07/2020   PLT 246 07/07/2020    Lab Results  Component Value Date   CREATININE 1.09 07/07/2020    Lab Results  Component Value Date   HGBA1C 5.4 11/07/2016    Lab Results  Component Value Date   AST 19 12/29/2019   Lab Results  Component Value Date   ALT 22 12/29/2019   Component     Latest Ref Rng & Units 09/20/2015 04/08/2017 04/09/2018 12/16/2018  Prostate Specific Ag,  Serum     0.0 - 4.0 ng/mL 0.8 0.8 1.1 0.8   Component     Latest Ref Rng & Units 01/04/2020  Prostate Specific Ag, Serum     0.0 - 4.0 ng/mL 0.8   Component     Latest Ref Rng & Units 10/24/2020  Specific Gravity, UA  1.005 - 1.030 1.020  pH, UA     5.0 - 7.5 6.5  Color, UA     Yellow Yellow  Appearance Ur     Clear Clear  Leukocytes,UA     Negative Trace (A)  Protein,UA     Negative/Trace Negative  Glucose, UA     NEGATIVE mg/dL Negative  Ketones, UA     Negative Negative  RBC, UA     Negative Negative  Bilirubin, UA     Negative Negative  Urobilinogen, Ur     0.2 - 1.0 mg/dL 1.0  Nitrite, UA     Negative Negative  Microscopic Examination      See below:   Component     Latest Ref Rng & Units 10/24/2020  WBC, UA     0 - 5 WBC/hpf 6-10 (A)  RBC     0 - 2 /hpf 0-2  Epithelial Cells (non renal)     0 - 10 /hpf None seen  Bacteria, UA     NONE SEEN None seen   I have reviewed the labs.   Pertinent Imaging: Results for [facility] (MRN [id number]) as of 11/30/2020 12:33  Ref. Range 10/24/2020 15:15  Scan Result Unknown 2     Assessment & Plan:    1. BPH with LUTS IPSS score is 14/2, it is worsening Continue conservative management, avoiding bladder irritants and timed voiding's Most bothersome symptoms is/are dysuria and weak urinary stream Initiate Cialis 5 mg daily  PSA pending RTC in 12 months for IPSS, PSA, PVR and exam   2. Chronic pelvic pain Likely secondary to genitofemoral nerve irritation Cannot see PT due to work constraints    Return for Pending PSA results .  These notes generated with voice recognition software. I apologize for typographical errors.  Michiel Cowboy, PA-C  [facility] [address] [place], [place] [address] [phone]

## 2020-10-24 ENCOUNTER — Ambulatory Visit (INDEPENDENT_AMBULATORY_CARE_PROVIDER_SITE_OTHER): Payer: 59 | Admitting: Urology

## 2020-10-24 ENCOUNTER — Other Ambulatory Visit: Payer: Self-pay

## 2020-10-24 ENCOUNTER — Encounter: Payer: Self-pay | Admitting: Urology

## 2020-10-24 VITALS — BP 118/87 | HR 85 | Ht 72.0 in | Wt 183.0 lb

## 2020-10-24 DIAGNOSIS — R1032 Left lower quadrant pain: Secondary | ICD-10-CM

## 2020-10-24 DIAGNOSIS — G8929 Other chronic pain: Secondary | ICD-10-CM | POA: Diagnosis not present

## 2020-10-24 DIAGNOSIS — N401 Enlarged prostate with lower urinary tract symptoms: Secondary | ICD-10-CM

## 2020-10-24 DIAGNOSIS — N138 Other obstructive and reflux uropathy: Secondary | ICD-10-CM | POA: Diagnosis not present

## 2020-10-24 LAB — BLADDER SCAN AMB NON-IMAGING: Scan Result: 2

## 2020-10-24 MED ORDER — TADALAFIL 5 MG PO TABS: 5.0000 mg | ORAL_TABLET | Freq: Every day | ORAL | 3 refills | Status: DC | PRN

## 2020-10-25 ENCOUNTER — Telehealth: Payer: Self-pay | Admitting: Family Medicine

## 2020-10-25 LAB — PSA: Prostate Specific Ag, Serum: 1.1 ng/mL (ref 0.0–4.0)

## 2020-10-25 NOTE — Telephone Encounter (Signed)
Patient notified and voiced understanding.

## 2020-10-25 NOTE — Telephone Encounter (Signed)
Unable to leave message, Voice mail not set up 

## 2020-10-25 NOTE — Telephone Encounter (Signed)
Pt returned missed call, I gave him his normal results, pt now has questions about the Cialas Rx that was given to him yesterday. Pt can take calls between 1-2p today.

## 2020-10-25 NOTE — Telephone Encounter (Signed)
-----   Message from Harle Battiest, PA-C sent at 10/25/2020  8:10 AM EST ----- Please let Joseph Strong know that his PSA and his urine were normal.

## 2020-10-26 ENCOUNTER — Other Ambulatory Visit: Payer: Self-pay

## 2020-10-26 ENCOUNTER — Emergency Department
Admission: EM | Admit: 2020-10-26 | Discharge: 2020-10-26 | Disposition: A | Discharge status: Home / Self Care | Payer: 59 | Attending: Emergency Medicine | Admitting: Emergency Medicine

## 2020-10-26 ENCOUNTER — Encounter: Payer: Self-pay | Admitting: *Deleted

## 2020-10-26 ENCOUNTER — Other Ambulatory Visit: Payer: Self-pay | Admitting: Family Medicine

## 2020-10-26 DIAGNOSIS — R369 Urethral discharge, unspecified: Secondary | ICD-10-CM | POA: Diagnosis not present

## 2020-10-26 DIAGNOSIS — F17210 Nicotine dependence, cigarettes, uncomplicated: Secondary | ICD-10-CM | POA: Diagnosis not present

## 2020-10-26 DIAGNOSIS — R300 Dysuria: Secondary | ICD-10-CM | POA: Diagnosis present

## 2020-10-26 DIAGNOSIS — Z202 Contact with and (suspected) exposure to infections with a predominantly sexual mode of transmission: Secondary | ICD-10-CM | POA: Diagnosis not present

## 2020-10-26 LAB — URINALYSIS, COMPLETE (UACMP) WITH MICROSCOPIC
Bacteria, UA: NONE SEEN
Bilirubin Urine: NEGATIVE
Glucose, UA: NEGATIVE mg/dL
Hgb urine dipstick: NEGATIVE
Ketones, ur: NEGATIVE mg/dL
Leukocytes,Ua: NEGATIVE
Nitrite: NEGATIVE
Protein, ur: NEGATIVE mg/dL
Specific Gravity, Urine: 1.003 — ABNORMAL LOW (ref 1.005–1.030)
Squamous Epithelial / HPF: NONE SEEN (ref 0–5)
pH: 5 (ref 5.0–8.0)

## 2020-10-26 LAB — CHLAMYDIA/NGC RT PCR (ARMC ONLY)
Chlamydia Tr: NOT DETECTED
N gonorrhoeae: NOT DETECTED

## 2020-10-26 MED ORDER — CEFTRIAXONE SODIUM 250 MG IJ SOLR
250.0000 mg | Freq: Once | INTRAMUSCULAR | Status: AC
  Administered 2020-10-26: 250 mg via INTRAMUSCULAR
  Filled 2020-10-26: qty 250

## 2020-10-26 MED ORDER — TADALAFIL 5 MG PO TABS: 5.0000 mg | ORAL_TABLET | Freq: Every day | ORAL | 3 refills | Status: DC | PRN

## 2020-10-26 MED ORDER — AZITHROMYCIN 500 MG PO TABS
1000.0000 mg | ORAL_TABLET | Freq: Once | ORAL | Status: AC
  Administered 2020-10-26: 1000 mg via ORAL
  Filled 2020-10-26: qty 2

## 2020-10-26 MED ORDER — LIDOCAINE HCL (PF) 1 % IJ SOLN
5.0000 mL | Freq: Once | INTRAMUSCULAR | Status: AC
  Administered 2020-10-26: 0.9 mL via INTRADERMAL
  Filled 2020-10-26: qty 5

## 2020-10-26 NOTE — Discharge Instructions (Signed)
Follow-up with [facility] department as needed.  Return emergency department if worsening.  If your test is positive you will need to notify any partners.  You also should refrain from sex for 7 to 10 days.  Always use a condom so you do not pass STDs back-and-forth.  Do not have sex with the same person until he also have been treated.

## 2020-10-26 NOTE — ED Provider Notes (Signed)
[facility] Emergency Department Provider Note  ____________________________________________   First MD Initiated Contact with Patient 10/26/20 2130     (approximate)  I have reviewed the triage vital signs and the nursing notes.   HISTORY  Chief Complaint Dysuria and Penile Discharge    HPI Joseph Strong is a 49 y.o. male presents emergency department complaint of dysuria and penile discharge.  Patient had sex with a new partner over the weekend.  States he has burning when he pees at this time.  Some discharge.  History of prostatitis and inguinal hernias.    Past Medical History:  Diagnosis Date   Burning with urination 01-10-16   Constipation    Dysrhythmia    "OCCASSIONAL SPEEDING UP OF HEART RATE" PT THINKING IT MAY BE DUE TO HERNIA   Headache    MIGRAINES   Inguinal hernia    left     Patient Active Problem List   Diagnosis Date Noted   Pancreas divisum 10/09/2018   Constipation 10/09/2018   Ventral hernia without obstruction or gangrene 10/09/2018   Prostate cancer screening 09/16/2017   Abdominal pain, periumbilical 12/20/2016   Decreased frequency of bowel movements 01/10/2016   Left paraspinal back pain 12/08/2015   Dizziness, nonspecific 10/31/2015   Abnormal TSH 10/31/2015   Chronic pain of left groin 10/31/2015   Abnormal EKG 10/31/2015   Testicular pain, left 09/20/2015   S/P unilateral inguinal hernia repair 09/20/2015    Past Surgical History:  Procedure Laterality Date   HERNIA REPAIR Left 1998   AND UMBILICAL   HERNIA REPAIR     49 years of age   INGUINAL HERNIA REPAIR Right 01/11/2016   Procedure: HERNIA REPAIR INGUINAL ADULT;  Surgeon: Kieth Brightly, MD;  Location: [facility] ORS;  Service: General;  Laterality: Right;   TONSILLECTOMY     childhood    Prior to Admission medications   Medication Sig Start Date End Date Taking? Authorizing Provider  acetaminophen (TYLENOL) 500 MG tablet  Take 500 mg by mouth every 6 (six) hours as needed for mild pain.    [provider]  mirabegron ER (MYRBETRIQ) 25 MG TB24 tablet Take 1 tablet (25 mg total) by mouth daily. Patient not taking: Reported on 10/24/2020 07/13/20   Michiel Cowboy A, PA-C  Multiple Vitamin (MULTIVITAMIN) tablet Take 1 tablet by mouth daily.    [provider]  tadalafil (CIALIS) 5 MG tablet Take 1 tablet (5 mg total) by mouth daily as needed for erectile dysfunction. 10/26/20   Michiel Cowboy A, PA-C    Allergies Patient has no known allergies.  Family History  Problem Relation Age of Onset   Hypertension Maternal Grandmother    Prostate cancer Neg Hx    Kidney cancer Neg Hx    Bladder Cancer Neg Hx     Social History Social History   Tobacco Use   Smoking status: Light Tobacco Smoker    Types: Cigars, Cigarettes   Smokeless tobacco: Never Used  Vaping Use   Vaping Use: Never used  Substance Use Topics   Alcohol use: Yes    Alcohol/week: 0.0 standard drinks    Comment: occasional   Drug use: No    Review of Systems  Constitutional: No fever/chills Eyes: No visual changes. ENT: No sore throat. Respiratory: Denies cough Gastrointestinal: Denies abdominal pain Genitourinary: Positive for dysuria and penile discharge  musculoskeletal: Negative for back pain. Skin: Negative for rash. Psychiatric: no mood changes,     ____________________________________________  PHYSICAL EXAM:  VITAL SIGNS: ED Triage Vitals  Enc Vitals Group     BP 10/26/20 2040 136/84     Pulse Rate 10/26/20 2040 (!) 56     Resp 10/26/20 2040 18     Temp 10/26/20 2040 97.6 F (36.4 C)     Temp Source 10/26/20 2040 Oral     SpO2 10/26/20 2040 100 %     Weight 10/26/20 2041 183 lb (83 kg)     Height 10/26/20 2041 6' (1.829 m)     Head Circumference --      Peak Flow --      Pain Score 10/26/20 2041 8     Pain Loc --      Pain Edu? --      Excl. in GC? --     Constitutional:  Alert and oriented. Well appearing and in no acute distress. Eyes: Conjunctivae are normal.  Head: Atraumatic. Nose: No congestion/rhinnorhea. Mouth/Throat: Mucous membranes are moist.   Neck:  supple no lymphadenopathy noted Cardiovascular: Normal rate, regular rhythm.  Respiratory: Normal respiratory effort.  No retractions,  Abd: soft nontender bs normal all 4 quad GU: deferred by the patient Musculoskeletal: FROM all extremities, warm and well perfused Neurologic:  Normal speech and language.  Skin:  Skin is warm, dry and intact. No rash noted. Psychiatric: Mood and affect are normal. Speech and behavior are normal.  ____________________________________________   LABS (all labs ordered are listed, but only abnormal results are displayed)  Labs Reviewed  URINALYSIS, COMPLETE (UACMP) WITH MICROSCOPIC - Abnormal; Notable for the following components:      Result Value   Color, Urine COLORLESS (*)    APPearance CLEAR (*)    Specific Gravity, Urine 1.003 (*)    All other components within normal limits  CHLAMYDIA/NGC RT PCR ([facility] ONLY)   ____________________________________________   ____________________________________________  RADIOLOGY    ____________________________________________   PROCEDURES  Procedure(s) performed: No  Procedures    ____________________________________________   INITIAL IMPRESSION / ASSESSMENT AND PLAN / ED COURSE  Pertinent labs & imaging results that were available during my care of the patient were reviewed by me and considered in my medical decision making (see chart for details).   Patient is a 49 year old male presents with a questionable STD exposure.  See HPI.  Physical exam is unremarkable  Urinalysis is normal.  I did add a chlamydia and gonorrhea test.  Patient would like to be treated prior to discharge as the results will take more than 2 hours.  He was given Rocephin 250 mg IM along with Zithromax 1 g p.o.  If  his test is positive he is to notify any partners.  Refrain from sex for 10 days.  Once all partners have been treated they may return to sexual activity along with using condoms.  He states he understands.  Is discharged stable condition.  Return if worsening.     Joseph Strong was evaluated in Emergency Department on 10/26/2020 for the symptoms described in the history of present illness. He was evaluated in the context of the global COVID-19 pandemic, which necessitated consideration that the patient might be at risk for infection with the SARS-CoV-2 virus that causes COVID-19. Institutional protocols and algorithms that pertain to the evaluation of patients at risk for COVID-19 are in a state of rapid change based on information released by regulatory bodies including the CDC and federal and state organizations. These policies and algorithms were followed during the patient's care in the  ED.    As part of my medical decision making, I reviewed the following data within the electronic MEDICAL RECORD NUMBER Nursing notes reviewed and incorporated, Labs reviewed , Old chart reviewed, Notes from prior ED visits and [place] Controlled Substance Database  ____________________________________________   FINAL CLINICAL IMPRESSION(S) / ED DIAGNOSES  Final diagnoses:  STD exposure  Dysuria      NEW MEDICATIONS STARTED DURING THIS VISIT:  New Prescriptions   No medications on file     Note:  This document was prepared using Dragon voice recognition software and may include unintentional dictation errors.    Faythe Ghee, PA-C 10/26/20 2215    Dionne Bucy, MD 10/26/20 [phone]

## 2020-10-26 NOTE — ED Triage Notes (Signed)
Pt ambulatory to triage.  Pt has dysuria and penile discharge. No v/d  Pt alert  Speech clear.

## 2020-10-27 LAB — URINALYSIS, COMPLETE
Bilirubin, UA: NEGATIVE
Glucose, UA: NEGATIVE
Ketones, UA: NEGATIVE
Nitrite, UA: NEGATIVE
Protein,UA: NEGATIVE
RBC, UA: NEGATIVE
Specific Gravity, UA: 1.02 (ref 1.005–1.030)
Urobilinogen, Ur: 1 mg/dL (ref 0.2–1.0)
pH, UA: 6.5 (ref 5.0–7.5)

## 2020-10-27 LAB — MICROSCOPIC EXAMINATION
Bacteria, UA: NONE SEEN
Epithelial Cells (non renal): NONE SEEN /hpf (ref 0–10)

## 2020-11-08 ENCOUNTER — Encounter: Payer: Self-pay | Admitting: Surgery

## 2020-11-08 ENCOUNTER — Other Ambulatory Visit: Payer: Self-pay

## 2020-11-08 ENCOUNTER — Ambulatory Visit (INDEPENDENT_AMBULATORY_CARE_PROVIDER_SITE_OTHER): Payer: 59 | Admitting: Surgery

## 2020-11-08 VITALS — BP 134/83 | HR 75 | Temp 97.6°F | Ht 72.0 in | Wt 185.0 lb

## 2020-11-08 DIAGNOSIS — R1032 Left lower quadrant pain: Secondary | ICD-10-CM | POA: Diagnosis not present

## 2020-11-08 DIAGNOSIS — R1033 Periumbilical pain: Secondary | ICD-10-CM

## 2020-11-08 DIAGNOSIS — G8929 Other chronic pain: Secondary | ICD-10-CM | POA: Diagnosis not present

## 2020-11-08 NOTE — Patient Instructions (Addendum)
Dr.Piscoya advised patient to avoid any heavy lifting, pushing or pulling or bending of 20 pounds or more. Dr.Piscoya suggested patient to try exercises such as jogging, walking and etc. Dr.Piscoya discussed the surgical repair and risk factors with patient at today's visit.  Referral was sent to Pain Clinic. Patient will follow up in 3 months.   Umbilical Hernia, Adult  A hernia is a bulge of tissue that pushes through an opening between muscles. An umbilical hernia happens in the abdomen, near the belly button (umbilicus). The hernia may contain tissues from the small intestine, large intestine, or fatty tissue covering the intestines (omentum). Umbilical hernias in adults tend to get worse over time, and they require surgical treatment. There are several types of umbilical hernias. You may have:  A hernia located just above or below the umbilicus (indirect hernia). This is the most common type of umbilical hernia in adults.  A hernia that forms through an opening formed by the umbilicus (direct hernia).  A hernia that comes and goes (reducible hernia). A reducible hernia may be visible only when you strain, lift something heavy, or cough. This type of hernia can be pushed back into the abdomen (reduced).  A hernia that traps abdominal tissue inside the hernia (incarcerated hernia). This type of hernia cannot be reduced.  A hernia that cuts off blood flow to the tissues inside the hernia (strangulated hernia). The tissues can start to die if this happens. This type of hernia requires emergency treatment. What are the causes? An umbilical hernia happens when tissue inside the abdomen presses on a weak area of the abdominal muscles. What increases the risk? You may have a greater risk of this condition if you:  Are obese.  Have had several pregnancies.  Have a buildup of fluid inside your abdomen (ascites).  Have had surgery that weakens the abdominal muscles. What are the signs or  symptoms? The main symptom of this condition is a painless bulge at or near the belly button. A reducible hernia may be visible only when you strain, lift something heavy, or cough. Other symptoms may include:  Dull pain.  A feeling of pressure. Symptoms of a strangulated hernia may include:  Pain that gets increasingly worse.  Nausea and vomiting.  Pain when pressing on the hernia.  Skin over the hernia becoming red or purple.  Constipation.  Blood in the stool. How is this diagnosed? This condition may be diagnosed based on:  A physical exam. You may be asked to cough or strain while standing. These actions increase the pressure inside your abdomen and force the hernia through the opening in your muscles. Your health care provider may try to reduce the hernia by pressing on it.  Your symptoms and medical history. How is this treated? Surgery is the only treatment for an umbilical hernia. Surgery for a strangulated hernia is done as soon as possible. If you have a small hernia that is not incarcerated, you may need to lose weight before having surgery. Follow these instructions at home:  Lose weight, if told by your health care provider.  Do not try to push the hernia back in.  Watch your hernia for any changes in color or size. Tell your health care provider if any changes occur.  You may need to avoid activities that increase pressure on your hernia.  Do not lift anything that is heavier than 10 lb (4.5 kg) until your health care provider says that this is safe.  Take over-the-counter  and prescription medicines only as told by your health care provider.  Keep all follow-up visits as told by your health care provider. This is important. Contact a health care provider if:  Your hernia gets larger.  Your hernia becomes painful. Get help right away if:  You develop sudden, severe pain near the area of your hernia.  You have pain as well as nausea or vomiting.  You  have pain and the skin over your hernia changes color.  You develop a fever. This information is not intended to replace advice given to you by your health care provider. Make sure you discuss any questions you have with your health care provider. Document Revised: 12/24/2017 Document Reviewed: 05/12/2017 Elsevier Patient Education  [place].

## 2020-11-08 NOTE — Progress Notes (Signed)
11/08/2020  History of Present Illness: Joseph Strong is a 49 y.o. male presenting for follow-up of a recurrent umbilical hernia. Patient has a history of open left inguinal and umbilical hernia repair in 1998. Patient also is status post open right inguinal hernia repair in 2017. He had a CT scan on 9/13 which showed a recurrent umbilical hernia just superior to the umbilicus. He was seen following the CT scan in the office but he was hesitant about surgery. He presents today for follow-up.  Patient reports that he still experiencing left groin pain and occasional right groin pain. He also reports that he feels that the left groin pain shoots upwards into his abdomen and also low towards his knee. He also has some discomfort at the umbilicus and periumbilical area particularly when he is straining for a bowel movement or when he is lifting something heavy. Does not report any worsening of his symptoms since I last saw him.  Past Medical History: Past Medical History:  Diagnosis Date  . Burning with urination 01-10-16  . Constipation   . Dysrhythmia    "OCCASSIONAL SPEEDING UP OF HEART RATE" PT THINKING IT MAY BE DUE TO HERNIA  . Headache    MIGRAINES  . Inguinal hernia    left      Past Surgical History: Past Surgical History:  Procedure Laterality Date  . HERNIA REPAIR Left 1998   AND UMBILICAL  . HERNIA REPAIR     49 years of age  . INGUINAL HERNIA REPAIR Right 01/11/2016   Procedure: HERNIA REPAIR INGUINAL ADULT;  Surgeon: Kieth Brightly, MD;  Location: [facility] ORS;  Service: General;  Laterality: Right;  . TONSILLECTOMY     childhood    Home Medications: Prior to Admission medications   Medication Sig Start Date End Date Taking? Authorizing Provider  acetaminophen (TYLENOL) 500 MG tablet Take 500 mg by mouth every 6 (six) hours as needed for mild pain.   Yes [provider]  Multiple Vitamin (MULTIVITAMIN) tablet Take 1 tablet by mouth daily.   Yes [provider]  mirabegron ER (MYRBETRIQ) 25 MG TB24 tablet Take 1 tablet (25 mg total) by mouth daily. Patient not taking: No sig reported 07/13/20   Michiel Cowboy A, PA-C  tadalafil (CIALIS) 5 MG tablet Take 1 tablet (5 mg total) by mouth daily as needed for erectile dysfunction. Patient not taking: Reported on 11/08/2020 10/26/20   Michiel Cowboy A, PA-C    Allergies: No Known Allergies  Review of Systems: Review of Systems  Constitutional: Negative for chills and fever.  Respiratory: Negative for shortness of breath.   Cardiovascular: Negative for chest pain.  Gastrointestinal: Positive for abdominal pain and constipation. Negative for diarrhea, nausea and vomiting.    Physical Exam BP 134/83   Pulse 75   Temp 97.6 F (36.4 C) (Oral)   Ht 6' (1.829 m)   Wt 185 lb (83.9 kg)   SpO2 97%   BMI 25.09 kg/m  CONSTITUTIONAL: No acute distress HEENT:  Normocephalic, atraumatic, extraocular motion intact. RESPIRATORY:  Normal respiratory effort without pathologic use of accessory muscles. CARDIOVASCULAR: Regular rhythm and rate. GI: The abdomen is soft, nondistended, with some discomfort to palpation over both right and left groins and also periumbilical area. There is no evidence of a recurrent right inguinal fold left inguinal hernia. Both incisions are well-healed without any seroma, induration, or breakdown. The umbilical incision is well-healed but the patient does have a small recurrence just superior to the umbilicus.  Area does have some discomfort when palpating.  NEUROLOGIC:  Motor and sensation is grossly normal.  Cranial nerves are grossly intact. PSYCH:  Alert and oriented to person, place and time. Affect is normal.  Labs/Imaging: None recently  Assessment and Plan: This is a 49 y.o. male with a recurrent umbilical hernia and chronic left and right groin pain.  -Discussed with the patient again that there is no hernia recurrence on either left or right groin. His  chronic pain is worse on the left groin compared to the right but again there is no evidence of any recurrence and the wounds have healed well. Discussed with him that this could be chronic pain related to scar tissue depending on how that may be irritating the inguinal nerves. I think the periumbilical discomfort that he is having is truly because of the very small umbilical hernia that he has still discussed with him that I would recommend surgery to repair it. At this point, the patient is more worried about his chronic pain that his periumbilical pain. In light of that, I discussed with him that we could refer him to the pain clinic first to help manage the chronic pain from his groin areas particularly the left and we can follow-up in 3 more months to see his progress and discuss whether he wants surgery for the recurrent umbilical hernia or not. He is in agreement with this. --Follow up in 3 months.  Face-to-face time spent with the patient and care providers was 25 minutes, with more than 50% of the time spent counseling, educating, and coordinating care of the patient.     [name], MD [facility]

## 2020-11-30 ENCOUNTER — Ambulatory Visit: Payer: Self-pay | Admitting: Urology

## 2020-12-01 ENCOUNTER — Encounter: Payer: Self-pay | Admitting: Urology

## 2020-12-26 ENCOUNTER — Other Ambulatory Visit: Payer: Self-pay | Admitting: Family Medicine

## 2020-12-26 DIAGNOSIS — N401 Enlarged prostate with lower urinary tract symptoms: Secondary | ICD-10-CM

## 2020-12-26 DIAGNOSIS — N138 Other obstructive and reflux uropathy: Secondary | ICD-10-CM

## 2020-12-27 ENCOUNTER — Other Ambulatory Visit: Payer: Self-pay

## 2020-12-27 ENCOUNTER — Encounter: Payer: Self-pay | Admitting: Urology

## 2020-12-27 ENCOUNTER — Other Ambulatory Visit: Payer: 59

## 2020-12-27 DIAGNOSIS — N138 Other obstructive and reflux uropathy: Secondary | ICD-10-CM

## 2020-12-28 LAB — PSA: Prostate Specific Ag, Serum: 0.7 ng/mL (ref 0.0–4.0)

## 2021-01-02 NOTE — Progress Notes (Signed)
12/16/2018  4:02 PM   Joseph Strong Oct 14, 1971 [id number]  Referring provider: Danelle Berry, PA-C [address] [place],  [place] [address]  Chief Complaint  Patient presents with  . Benign Prostatic Hypertrophy   Urological history: 1. BPH with LU TS - PSA 0.7 in 12/2020 - I PSS 12/1 - PVR 9 mL - cysto in 2018 NED - managed with tadalafil 5 mg daily  2. Chronic pelvic pain/dysuria - scrotal US x 2 - NED - CT scans x 2 - NED - MRI NED - still waiting on colonoscopy  3. ED - SHIM 25  HPI: Joseph Strong is a 50 y.o. male with BPH with LU TS, chronic pain and dysuria who presents for a IPSS, PSA and exam.  He has no urinary complaints at this time.  He has not been taking his Cialis 5 mg daily as he was under the impression it would contribute to erectile dysfunction.  Patient denies any modifying or aggravating factors.  Patient denies any gross hematuria, dysuria or suprapubic/flank pain.  Patient denies any fevers, chills, nausea or vomiting.   PVR 9 mL    IPSS    Row Name 01/03/21 1500         International Prostate Symptom Score   How often have you had the sensation of not emptying your bladder? Less than 1 in 5     How often have you had to urinate less than every two hours? Less than half the time     How often have you found you stopped and started again several times when you urinated? Less than half the time     How often have you found it difficult to postpone urination? Less than 1 in 5 times     How often have you had a weak urinary stream? Less than half the time     How often have you had to strain to start urination? Less than half the time     How many times did you typically get up at night to urinate? 2 Times     Total IPSS Score 12           Quality of Life due to urinary symptoms   If you were to spend the rest of your life with your urinary condition just the way it is now how would you feel about that? Pleased             Score:  1-7 Mild 8-19 Moderate 20-35 Severe    Patient still having spontaneous erections.   He denies any pain or curvature with erections.    SHIM    Row Name 01/03/21 1525         SHIM: Over the last 6 months:   How do you rate your confidence that you could get and keep an erection? Very High     When you had erections with sexual stimulation, how often were your erections hard enough for penetration (entering your partner)? Almost Always or Always     During sexual intercourse, how often were you able to maintain your erection after you had penetrated (entered) your partner? Almost Always or Always     During sexual intercourse, how difficult was it to maintain your erection to completion of intercourse? Not Difficult     When you attempted sexual intercourse, how often was it satisfactory for you? Almost Always or Always           SHIM Total Score  SHIM 25            Score: 1-7 Severe ED 8-11 Moderate ED 12-16 Mild-Moderate ED 17-21 Mild ED 22-25 No ED   PMH: Past Medical History:  Diagnosis Date  . Burning with urination 01-10-16  . Constipation   . Dysrhythmia    "OCCASSIONAL SPEEDING UP OF HEART RATE" PT THINKING IT MAY BE DUE TO HERNIA  . Headache    MIGRAINES  . Inguinal hernia    left     Surgical History: Past Surgical History:  Procedure Laterality Date  . HERNIA REPAIR Left 1998   AND UMBILICAL  . HERNIA REPAIR     50 years of age  . INGUINAL HERNIA REPAIR Right 01/11/2016   Procedure: HERNIA REPAIR INGUINAL ADULT;  Surgeon: Joseph Brightly, MD;  Location: [facility] ORS;  Service: General;  Laterality: Right;  . TONSILLECTOMY     childhood    Home Medications:  Allergies as of 01/03/2021   No Known Allergies     Medication List       Accurate as of January 03, 2021  4:02 PM. If you have any questions, ask your nurse or doctor.        acetaminophen 500 MG tablet Commonly known as: TYLENOL Take 500 mg by mouth every 6 (six)  hours as needed for mild pain.   mirabegron ER 25 MG Tb24 tablet Commonly known as: MYRBETRIQ Take 1 tablet (25 mg total) by mouth daily.   multivitamin tablet Take 1 tablet by mouth daily.   tadalafil 5 MG tablet Commonly known as: CIALIS Take 1 tablet (5 mg total) by mouth daily as needed for erectile dysfunction.       Allergies: No Known Allergies  Family History: Family History  Problem Relation Age of Onset  . Hypertension Maternal Grandmother   . Prostate cancer Neg Hx   . Kidney cancer Neg Hx   . Bladder Cancer Neg Hx     Social History:  reports that he has been smoking cigars and cigarettes. He has never used smokeless tobacco. He reports current alcohol use. He reports that he does not use drugs.  ROS: For pertinent review of systems please refer to history of present illness  Physical Exam: BP 135/89   Pulse 67   Ht 6' (1.829 m)   Wt 185 lb (83.9 kg)   BMI 25.09 kg/m   Constitutional:  Well nourished. Alert and oriented, No acute distress. HEENT: [place] AT, mask in place.  Trachea midline Cardiovascular: No clubbing, cyanosis, or edema. Respiratory: Normal respiratory effort, no increased work of breathing. Neurologic: Grossly intact, no focal deficits, moving all 4 extremities. Psychiatric: Normal mood and affect.  Laboratory Data: Component     Latest Ref Rng & Units 04/08/2017 04/09/2018 12/16/2018 01/04/2020  Prostate Specific Ag, Serum     0.0 - 4.0 ng/mL 0.8 1.1 0.8 0.8   Component     Latest Ref Rng & Units 10/24/2020 12/27/2020  Prostate Specific Ag, Serum     0.0 - 4.0 ng/mL 1.1 0.7   I have reviewed the labs.   Pertinent Imaging: [facility] ([facility] [id number]) as of 01/03/2021 15:30  Ref. Range 01/03/2021 15:25  Scan Result Unknown 87mL    Assessment & Plan:    1. BPH with LUTS IPSS score is 12/1, it is worsening Continue conservative management, avoiding bladder irritants and timed voiding's I encourage the patient to  take his Cialis 5 mg daily as it is  actually a treatment for erectile dysfunction as well as a treatment for BPH RTC in 12 months for IPSS, PSA, PVR and exam   2. Chronic pelvic pain Likely secondary to genitofemoral nerve irritation Cannot see PT due to work constraints    Return in about 1 year (around 01/03/2022) for IPSS, SHIM, PSA and exam.  These notes generated with voice recognition software. I apologize for typographical errors.  Michiel Cowboy, PA-C  [facility] [address] [place], [place] [address] [phone]

## 2021-01-03 ENCOUNTER — Encounter: Payer: Self-pay | Admitting: Urology

## 2021-01-03 ENCOUNTER — Other Ambulatory Visit: Payer: Self-pay

## 2021-01-03 ENCOUNTER — Ambulatory Visit (INDEPENDENT_AMBULATORY_CARE_PROVIDER_SITE_OTHER): Payer: 59 | Admitting: Urology

## 2021-01-03 VITALS — BP 135/89 | HR 67 | Ht 72.0 in | Wt 185.0 lb

## 2021-01-03 DIAGNOSIS — N138 Other obstructive and reflux uropathy: Secondary | ICD-10-CM

## 2021-01-03 DIAGNOSIS — N401 Enlarged prostate with lower urinary tract symptoms: Secondary | ICD-10-CM

## 2021-01-03 LAB — BLADDER SCAN AMB NON-IMAGING

## 2021-02-07 ENCOUNTER — Ambulatory Visit: Payer: 59 | Admitting: Surgery

## 2021-02-12 ENCOUNTER — Ambulatory Visit (INDEPENDENT_AMBULATORY_CARE_PROVIDER_SITE_OTHER): Payer: 59 | Admitting: Surgery

## 2021-02-12 ENCOUNTER — Other Ambulatory Visit: Payer: Self-pay

## 2021-02-12 ENCOUNTER — Encounter: Payer: Self-pay | Admitting: Surgery

## 2021-02-12 VITALS — BP 116/78 | HR 71 | Temp 97.8°F | Ht 72.0 in | Wt 186.4 lb

## 2021-02-12 DIAGNOSIS — K432 Incisional hernia without obstruction or gangrene: Secondary | ICD-10-CM | POA: Diagnosis not present

## 2021-02-12 NOTE — Progress Notes (Signed)
02/12/2021  History of Present Illness: Joseph Strong is a 50 y.o. male presenting for follow up of groin pain and umbilical pain.  The patient has a history of open umbilical hernia repair and open bilateral inguinal hernia repair.  He has been having mostly left groin that is stable and also periumbilical discomfort.  He does feel that the periumbilical hernia has gotten perhaps a bit bigger.  Denies that the pain is worse but is still there.  He had an appointment with the pain clinic for evaluation next month.    Of note, he reports that his girlfriend was just diagnosed with what appears to be multiple myeloma.  Past Medical History: Past Medical History:  Diagnosis Date  . Burning with urination 01-10-16  . Constipation   . Dysrhythmia    "OCCASSIONAL SPEEDING UP OF HEART RATE" PT THINKING IT MAY BE DUE TO HERNIA  . Headache    MIGRAINES  . Inguinal hernia    left      Past Surgical History: Past Surgical History:  Procedure Laterality Date  . HERNIA REPAIR Left [id number]   AND UMBILICAL  . HERNIA REPAIR     50 years of age  . INGUINAL HERNIA REPAIR Right 01/11/2016   Procedure: HERNIA REPAIR INGUINAL ADULT;  Surgeon: Christene Lye, MD;  Location: [facility] ORS;  Service: General;  Laterality: Right;  . TONSILLECTOMY     childhood    Home Medications: Prior to Admission medications   Medication Sig Start Date End Date Taking? Authorizing Provider  acetaminophen (TYLENOL) 500 MG tablet Take 500 mg by mouth every 6 (six) hours as needed for mild pain.   Yes [provider]  Multiple Vitamin (MULTIVITAMIN) tablet Take 1 tablet by mouth daily.   Yes [provider]  tadalafil (CIALIS) 5 MG tablet Take 1 tablet (5 mg total) by mouth daily as needed for erectile dysfunction. 10/26/20  Yes McGowan, Larene [place] A, PA-C    Allergies: No Known Allergies  Review of Systems: Review of Systems  Constitutional: Negative for chills and fever.  Respiratory: Negative for  shortness of breath.   Cardiovascular: Negative for chest pain.  Gastrointestinal: Positive for abdominal pain. Negative for nausea and vomiting.  Skin: Negative for rash.    Physical Exam BP 116/78   Pulse 71   Temp 97.8 F (36.6 C) (Oral)   Ht 6' (1.829 m)   Wt 186 lb 6.4 oz (84.6 kg)   SpO2 98%   BMI 25.28 kg/m  CONSTITUTIONAL: No acute distress HEENT:  Normocephalic, atraumatic, extraocular motion intact. RESPIRATORY:  Normal respiratory effort without pathologic use of accessory muscles. CARDIOVASCULAR:  Regular rhythm and rate. GI: The abdomen is soft, non-distended, with some mild discomfort particularly in the supraumbilical area where the hernia recurrence is.  This is overall small but I do agree that has gotten a bit larger compared to before.  The defect is still small though, about 1 cm. No recurrence in either groin. NEUROLOGIC:  Motor and sensation is grossly normal.  Cranial nerves are grossly intact. PSYCH:  Alert and oriented to person, place and time. Affect is normal.   Assessment and Plan: This is a 50 y.o. male with an incisional hernia  --Discussed with the patient the goal for minimally invasive, robotic approach to repairing the incisional hernia.  Discussed with him the post-operative activity restrictions of no heavy lifting.  He mentioned that his girlfriend was just diagnosed with multiple myeloma.  I think at this point, his  symptoms are overall stable, and although the hernia defect is a bit bigger, his symptoms have not increased.  Given the current situation, I think it's ok to defer surgical treatment so that he is able to help his girlfriend with her treatments and be more present.  He would like to revisit things in May to see if surgery can be scheduled at that point.  I think that's reasonable and gave him return precautions. --Follow up in May.  Face-to-face time spent with the patient and care providers was 25 minutes, with more than 50% of the  time spent counseling, educating, and coordinating care of the patient.     Melvyn Neth, [facility]

## 2021-02-12 NOTE — Patient Instructions (Addendum)
Please see your follow up appointment listed below.  °

## 2021-02-20 ENCOUNTER — Encounter: Payer: Self-pay | Admitting: Family Medicine

## 2021-02-20 ENCOUNTER — Ambulatory Visit (INDEPENDENT_AMBULATORY_CARE_PROVIDER_SITE_OTHER): Payer: 59 | Admitting: Family Medicine

## 2021-02-20 ENCOUNTER — Other Ambulatory Visit: Payer: Self-pay

## 2021-02-20 ENCOUNTER — Other Ambulatory Visit (HOSPITAL_COMMUNITY)
Admission: RE | Admit: 2021-02-20 | Discharge: 2021-02-20 | Disposition: A | Discharge status: Home / Self Care | Payer: 59 | Source: Ambulatory Visit | Attending: Family Medicine | Admitting: Family Medicine

## 2021-02-20 VITALS — BP 128/86 | HR 87 | Temp 97.5°F | Resp 16 | Ht 72.0 in | Wt 188.0 lb

## 2021-02-20 DIAGNOSIS — R300 Dysuria: Secondary | ICD-10-CM | POA: Insufficient documentation

## 2021-02-20 DIAGNOSIS — Z130 Encounter for screening for diseases of the blood and blood-forming organs and certain disorders involving the immune mechanism: Secondary | ICD-10-CM

## 2021-02-20 DIAGNOSIS — Z13228 Encounter for screening for other metabolic disorders: Secondary | ICD-10-CM

## 2021-02-20 DIAGNOSIS — Z13220 Encounter for screening for lipoid disorders: Secondary | ICD-10-CM

## 2021-02-20 DIAGNOSIS — Z0000 Encounter for general adult medical examination without abnormal findings: Secondary | ICD-10-CM

## 2021-02-20 DIAGNOSIS — Z1159 Encounter for screening for other viral diseases: Secondary | ICD-10-CM

## 2021-02-20 DIAGNOSIS — Z1211 Encounter for screening for malignant neoplasm of colon: Secondary | ICD-10-CM | POA: Diagnosis not present

## 2021-02-20 DIAGNOSIS — R7989 Other specified abnormal findings of blood chemistry: Secondary | ICD-10-CM

## 2021-02-20 DIAGNOSIS — Z1329 Encounter for screening for other suspected endocrine disorder: Secondary | ICD-10-CM

## 2021-02-20 LAB — POCT URINALYSIS DIPSTICK
Bilirubin, UA: NEGATIVE
Blood, UA: NEGATIVE
Glucose, UA: NEGATIVE
Ketones, UA: NEGATIVE
Leukocytes, UA: NEGATIVE
Nitrite, UA: NEGATIVE
Odor: NORMAL
Protein, UA: NEGATIVE
Spec Grav, UA: 1.02 (ref 1.010–1.025)
Urobilinogen, UA: 0.2 E.U./dL
pH, UA: 5 (ref 5.0–8.0)

## 2021-02-20 NOTE — Progress Notes (Signed)
Patient: Joseph Strong, Male    DOB: 03/07/1971, 50 y.o.   MRN: [id number], [place] Visit Date: 02/20/2021  Today's Provider: Danelle Berry, PA-C   Chief Complaint  Patient presents with  . Annual Exam   Subjective:   Annual physical exam:  Joseph Strong is a 50 y.o. male who presents today for health maintenance and annual & complete physical exam.   Exercise/Activity:  Exercising daily about 20 min  Diet/nutrition:  Eats pretty well, needs to eat more fruits and veggies and drink more water Sleep:  No problems   Wants to check urine due to some burning - some abd pain -he is not taking Cialis as prescribed he was encouraged to try it daily for his urinary symptoms, he denies any suspected STD exposure he has had no blood in his urine but continues to have urinary frequency nocturia and urgency would like his urine checked today  USPSTF grade A and B recommendations - reviewed and addressed today  Depression:   Phq 9 completed today by patient, was reviewed by me with patient in the room, score is neg, pt feels good PHQ 2/9 Scores 02/20/2021 12/29/2019 12/10/2018 10/09/2018  PHQ - 2 Score 0 0 0 0  PHQ- 9 Score - 0 0 0   Depression screen [facility] 2/9 02/20/2021 12/29/2019 12/10/2018 10/09/2018 04/24/2018  Decreased Interest 0 0 0 0 0  Down, Depressed, Hopeless 0 0 0 0 0  PHQ - 2 Score 0 0 0 0 0  Altered sleeping - 0 0 0 -  Tired, decreased energy - 0 0 0 -  Change in appetite - 0 0 0 -  Feeling bad or failure about yourself  - 0 0 0 -  Trouble concentrating - 0 0 0 -  Moving slowly or fidgety/restless - 0 0 0 -  Suicidal thoughts - 0 0 0 -  PHQ-9 Score - 0 0 0 -  Difficult doing work/chores - Not difficult at all Not difficult at all - -    Hep C Screening: today STD testing and prevention (HIV/chl/gon/syphilis):  Screen - one partner  Intimate partner violence:  safe  Prostate cancer:  Per urology Prostate cancer screening with PSA: Discussed risks and  benefits of PSA testing and provided handout. Pt declines to have PSA drawn today.  Lab Results  Component Value Date   PSA 0.7 11/05/2016      Advanced Care Planning:  A voluntary discussion about advance care planning including the explanation and discussion of advance directives. chart.  Health Maintenance  Topic Date Due  . Hepatitis C Screening  Never done  . COVID-19 Vaccine (1) Never done  . COLONOSCOPY (Pts 45-74yrs Insurance coverage will need to be confirmed)  Never done  . INFLUENZA VACCINE  06/25/2020  . TETANUS/TDAP  07/18/2027  . HIV Screening  Completed  . HPV VACCINES  Aged Out     Skin cancer:    Pt reports no hx of skin cancer, suspicious lesions/biopsies in the past.  Colorectal cancer:  colonoscopy is due   Pt denies melena hematochezia   Lung cancer:  Low Dose CT Chest recommended if Age 80-80 years, 20 pack-year currently smoking OR have quit w/in 15years. Patient does qualify.   Social History   Tobacco Use  . Smoking status: Light Tobacco Smoker    Types: Cigars, Cigarettes  . Smokeless tobacco: Never Used  Substance Use Topics  . Alcohol use: Yes    Alcohol/week: 0.0 standard  drinks    Comment: occasional     Alcohol screening: Flowsheet Row Office Visit from 12/29/[facility]  AUDIT-C Score 1      AAA:  The USPSTF recommends one-time screening with ultrasonography in men ages 38 to 70 years who have ever smoked  LNL:GXQJ indicated  Blood pressure/Hypertension: BP Readings from Last 3 Encounters:  02/20/21 128/86  02/12/21 116/78  01/03/21 135/89   Weight/Obesity: Wt Readings from Last 3 Encounters:  02/20/21 188 lb (85.3 kg)  02/12/21 186 lb 6.4 oz (84.6 kg)  01/03/21 185 lb (83.9 kg)   BMI Readings from Last 3 Encounters:  02/20/21 25.50 kg/m  02/12/21 25.28 kg/m  01/03/21 25.09 kg/m    Lipids:  Lab Results  Component Value Date   CHOL 160 12/29/2019   CHOL 167 12/10/2018   CHOL 169  12/01/2017   Lab Results  Component Value Date   HDL 57 12/29/2019   HDL 57 12/10/2018   HDL 67 12/01/2017   Lab Results  Component Value Date   LDLCALC 90 12/29/2019   LDLCALC 95 12/10/2018   LDLCALC 88 12/01/2017   Lab Results  Component Value Date   TRIG 44 12/29/2019   TRIG 61 12/10/2018   TRIG 46 12/01/2017   Lab Results  Component Value Date   CHOLHDL 2.8 12/29/2019   CHOLHDL 2.9 12/10/2018   CHOLHDL 2.5 12/01/2017   No results found for: LDLDIRECT Based on the results of lipid panel his/her cardiovascular risk factor ( using [place] Cohort )  in the next 10 years is : The 10-year ASCVD risk score Denman George DC Montez Hageman., et al., 2013) is: 7.8%   Values used to calculate the score:     Age: 47 years     Sex: Male     Is Non-Hispanic African American: Yes     Diabetic: No     Tobacco smoker: Yes     Systolic Blood Pressure: 128 mmHg     Is BP treated: No     HDL Cholesterol: 57 mg/dL     Total Cholesterol: 160 mg/dL Glucose:  Glucose  Date Value Ref Range Status  09/30/2014 92 65 - 99 mg/dL Final  19/41/7408 83 65 - 99 mg/dL Final  14/48/1856 56 (L) 65 - 99 mg/dL Final   Glucose, Bld  Date Value Ref Range Status  07/07/2020 117 (H) 70 - 99 mg/dL Final    Comment:    Glucose reference range applies only to samples taken after fasting for at least 8 hours.  12/29/2019 84 65 - 99 mg/dL Final    Comment:    .            Fasting reference interval .   12/10/2018 74 65 - 99 mg/dL Final    Comment:    .            Fasting reference interval .     Social History      He  reports that he has been smoking cigars and cigarettes. He has never used smokeless tobacco. He reports current alcohol use. He reports that he does not use drugs.       Social History   Socioeconomic History  . Marital status: Single    Spouse name: Not on file  . Number of children: 1  . Years of education: 40  . Highest education level: 11th grade  Occupational History  . Not on file   Tobacco Use  . Smoking status:  Light Tobacco Smoker    Types: Cigars, Cigarettes  . Smokeless tobacco: Never Used  Vaping Use  . Vaping Use: Never used  Substance and Sexual Activity  . Alcohol use: Yes    Alcohol/week: 0.0 standard drinks    Comment: occasional  . Drug use: No  . Sexual activity: Yes  Other Topics Concern  . Not on file  Social History Narrative  . Not on file   Social Determinants of Health   Financial Resource Strain: Low Risk   . Difficulty of Paying Living Expenses: Not hard at all  Food Insecurity: No Food Insecurity  . Worried About Programme researcher, broadcasting/film/video in the Last Year: Never true  . Ran Out of Food in the Last Year: Never true  Transportation Needs: No Transportation Needs  . Lack of Transportation (Medical): No  . Lack of Transportation (Non-Medical): No  Physical Activity: Insufficiently Active  . Days of Exercise per Week: 7 days  . Minutes of Exercise per Session: 20 min  Stress: No Stress Concern Present  . Feeling of Stress : Only a little  Social Connections: Moderately Isolated  . Frequency of Communication with Friends and Family: Three times a week  . Frequency of Social Gatherings with Friends and Family: Twice a week  . Attends Religious Services: More than 4 times per year  . Active Member of Clubs or Organizations: Patient refused  . Attends Banker Meetings: Never  . Marital Status: Never married     Family History        Family Status  Relation Name Status  . Mother  Deceased  . Father  Deceased  . MGM  Alive  . Neg Hx  (Not Specified)        His family history includes Hypertension in his maternal grandmother. There is no history of Prostate cancer, Kidney cancer, or Bladder Cancer.       Family History  Problem Relation Age of Onset  . Hypertension Maternal Grandmother   . Prostate cancer Neg Hx   . Kidney cancer Neg Hx   . Bladder Cancer Neg Hx     Patient Active Problem List   Diagnosis Date  Noted  . Pancreas divisum 10/09/2018  . Constipation 10/09/2018  . Ventral hernia without obstruction or gangrene 10/09/2018  . Prostate cancer screening 09/16/2017  . Abdominal pain, periumbilical 12/20/2016  . Decreased frequency of bowel movements 01/10/2016  . Left paraspinal back pain 12/08/2015  . Dizziness, nonspecific 10/31/2015  . Abnormal TSH 10/31/2015  . Chronic pain of left groin 10/31/2015  . Abnormal EKG 10/31/2015  . Testicular pain, left 09/20/2015  . S/P unilateral inguinal hernia repair 09/20/2015    Past Surgical History:  Procedure Laterality Date  . HERNIA REPAIR Left 1998   AND UMBILICAL  . HERNIA REPAIR     50 years of age  . INGUINAL HERNIA REPAIR Right 01/11/2016   Procedure: HERNIA REPAIR INGUINAL ADULT;  Surgeon: Kieth Brightly, MD;  Location: [facility] ORS;  Service: General;  Laterality: Right;  . TONSILLECTOMY     childhood     Current Outpatient Medications:  .  acetaminophen (TYLENOL) 500 MG tablet, Take 500 mg by mouth every 6 (six) hours as needed for mild pain., Disp: , Rfl:  .  Multiple Vitamin (MULTIVITAMIN) tablet, Take 1 tablet by mouth daily., Disp: , Rfl:  .  tadalafil (CIALIS) 5 MG tablet, Take 1 tablet (5 mg total) by mouth daily as needed for erectile dysfunction.,  Disp: 90 tablet, Rfl: 3  No Known Allergies  Patient Care Team: [facility], [place] as PCP - General (Family Medicine) Pasty Spillers, MD as Consulting Physician (Gastroenterology)   Chart Review: I personally reviewed active problem list, medication list, allergies, family history, social history, health maintenance, notes from last encounter, lab results, imaging with the patient/caregiver today.   Review of Systems  Constitutional: Negative.  Negative for activity change, appetite change, fatigue and unexpected weight change.  HENT: Negative.   Eyes: Negative.   Respiratory: Negative.  Negative for shortness of breath.   Cardiovascular:  Negative.  Negative for chest pain, palpitations and leg swelling.  Gastrointestinal: Negative.  Negative for abdominal pain and blood in stool.  Endocrine: Negative.   Genitourinary: Negative.  Negative for decreased urine volume, difficulty urinating, testicular pain and urgency.  Skin: Negative.  Negative for color change and pallor.  Allergic/Immunologic: Negative.   Neurological: Negative.  Negative for syncope, weakness, light-headedness and numbness.  Psychiatric/Behavioral: Negative.  Negative for confusion, dysphoric mood, self-injury and suicidal ideas. The patient is not nervous/anxious.   All other systems reviewed and are negative.         Objective:   Vitals:  Vitals:   02/20/21 1531  BP: 128/86  Pulse: 87  Resp: 16  Temp: (!) 97.5 F (36.4 C)  TempSrc: Oral  SpO2: 99%  Weight: 188 lb (85.3 kg)  Height: 6' (1.829 m)    Body mass index is 25.5 kg/m.  Physical Exam Constitutional:      General: He is not in acute distress.    Appearance: Normal appearance. He is well-developed. He is not ill-appearing or toxic-appearing.  HENT:     Head: Normocephalic and atraumatic.     Jaw: No trismus.     Right Ear: Tympanic membrane, ear canal and external ear normal.     Left Ear: Tympanic membrane, ear canal and external ear normal.     Nose: No mucosal edema or rhinorrhea.     Right Sinus: No maxillary sinus tenderness or frontal sinus tenderness.     Left Sinus: No maxillary sinus tenderness or frontal sinus tenderness.     Mouth/Throat:     Pharynx: Uvula midline. No oropharyngeal exudate, posterior oropharyngeal erythema or uvula swelling.  Eyes:     General: Lids are normal.     Conjunctiva/sclera: Conjunctivae normal.     Pupils: Pupils are equal, round, and reactive to light.  Neck:     Trachea: Trachea and phonation normal. No tracheal deviation.  Cardiovascular:     Rate and Rhythm: Regular rhythm.     Pulses: Normal pulses.          Radial pulses are  2+ on the right side and 2+ on the left side.       Posterior tibial pulses are 2+ on the right side and 2+ on the left side.     Heart sounds: Normal heart sounds. No murmur heard. No friction rub. No gallop.   Pulmonary:     Effort: Pulmonary effort is normal.     Breath sounds: Normal breath sounds. No wheezing, rhonchi or rales.  Abdominal:     General: Bowel sounds are normal. There is no distension.     Palpations: Abdomen is soft.     Tenderness: There is no abdominal tenderness. There is no guarding or rebound.  Musculoskeletal:     Cervical back: Normal range of motion and neck supple.     Comments: scoliosis  Skin:    General: Skin is warm and dry.     Capillary Refill: Capillary refill takes less than 2 seconds.     Findings: No rash.  Neurological:     Mental Status: He is alert and oriented to person, place, and time.     Gait: Gait normal.  Psychiatric:        Speech: Speech normal.        Behavior: Behavior normal.      Recent Results (from the past 2160 hour(s))  PSA     Status: None   Collection Time: 12/27/20  4:14 PM  Result Value Ref Range   Prostate Specific Ag, Serum 0.7 0.0 - 4.0 ng/mL    Comment: Roche ECLIA methodology. According to the American Urological Association, Serum PSA should decrease and remain at undetectable levels after radical prostatectomy. The AUA defines biochemical recurrence as an initial PSA value 0.2 ng/mL or greater followed by a subsequent confirmatory PSA value 0.2 ng/mL or greater. Values obtained with different assay methods or kits cannot be used interchangeably. Results cannot be interpreted as absolute evidence of the presence or absence of malignant disease.   Bladder Scan (Post Void Residual) in office     Status: None   Collection Time: 01/03/21  3:25 PM  Result Value Ref Range   Scan Result 9mL     Diabetic Foot Exam: Diabetic Foot Exam - Simple   No data filed     Fall Risk: Fall Risk  02/20/2021  02/12/2021 11/08/2020 08/14/2020 07/17/2020  Falls in the past year? 0 0 0 0 0  Number falls in past yr: 0 - 0 0 0  Injury with Fall? 0 - 0 0 0    Functional Status Survey: Is the patient deaf or have difficulty hearing?: No Does the patient have difficulty seeing, even when wearing glasses/contacts?: No Does the patient have difficulty concentrating, remembering, or making decisions?: No Does the patient have difficulty walking or climbing stairs?: No Does the patient have difficulty dressing or bathing?: No Does the patient have difficulty doing errands alone such as visiting a doctor's office or shopping?: No   Assessment & Plan:    CPE completed today  . Prostate cancer screening and PSA options (with potential risks and benefits of testing vs not testing) were discussed along with recent recs/guidelines, shared decision making and handout/information given to pt today  . USPSTF grade A and B recommendations reviewed with patient; age-appropriate recommendations, preventive care, screening tests, etc discussed and encouraged; healthy living encouraged; see AVS for patient education given to patient  . Discussed importance of 150 minutes of physical activity weekly, AHA exercise recommendations given to pt in AVS/handout  . Discussed importance of healthy diet:  eating lean meats and proteins, avoiding trans fats and saturated fats, avoid simple sugars and excessive carbs in diet, eat 6 servings of fruit/vegetables daily and drink plenty of water and avoid sweet beverages.  DASH diet reviewed if pt has HTN  . Recommended pt to do annual eye exam and routine dental exams/cleanings  . Reviewed Health Maintenance: Health Maintenance  Topic Date Due  . Hepatitis C Screening  Never done  . COVID-19 Vaccine (1) Never done  . COLONOSCOPY (Pts 45-283yrs Insurance coverage will need to be confirmed)  Never done  . INFLUENZA VACCINE  06/25/2020  . TETANUS/TDAP  07/18/2027  . HIV Screening   Completed  . HPV VACCINES  Aged Out    . Immunizations: Immunization History  Administered  Date(s) Administered  . Influenza Inj Mdck Quad With Preservative 09/12/2018  . Influenza,inj,Quad PF,6+ Mos 12/08/2015  . Influenza-Unspecified 08/20/2016  . Pneumococcal Polysaccharide-23 03/01/2016  . Tdap 07/17/2017     ICD-10-CM   1. Adult general medical exam  Z00.00 COMPLETE METABOLIC PANEL WITH GFR    CBC w/Diff/Platelet  2. Screening for lipoid disorders  Z13.220   3. Screening for endocrine, metabolic and immunity disorder  Z13.29 COMPLETE METABOLIC PANEL WITH GFR   Z13.228 CBC w/Diff/Platelet   Z13.0   4. Encounter for screening colonoscopy  Z12.11 CANCELED: Ambulatory referral to Gastroenterology  5. Abnormal TSH  R79.89 TSH    T4, free  6. Encounter for hepatitis C screening test for low risk patient  Z11.59 Hepatitis C antibody  7. Dysuria  R30.0 POCT urinalysis dipstick    Urine cytology ancillary only       Danelle Berry, PA-C 02/20/21 3:48 PM  [facility]

## 2021-02-20 NOTE — Patient Instructions (Signed)

## 2021-02-21 LAB — CBC WITH DIFFERENTIAL/PLATELET
Absolute Monocytes: 588 cells/uL (ref 200–950)
Basophils Absolute: 39 cells/uL (ref 0–200)
Basophils Relative: 0.8 %
Eosinophils Absolute: 181 cells/uL (ref 15–500)
Eosinophils Relative: 3.7 %
HCT: 50.8 % — ABNORMAL HIGH (ref 38.5–50.0)
Hemoglobin: 16.4 g/dL (ref 13.2–17.1)
Lymphs Abs: 1642 cells/uL (ref 850–3900)
MCH: 27.7 pg (ref 27.0–33.0)
MCHC: 32.3 g/dL (ref 32.0–36.0)
MCV: 86 fL (ref 80.0–100.0)
MPV: 10.6 fL (ref 7.5–12.5)
Monocytes Relative: 12 %
Neutro Abs: 2450 cells/uL (ref 1500–7800)
Neutrophils Relative %: 50 %
Platelets: 260 10*3/uL (ref 140–400)
RBC: 5.91 10*6/uL — ABNORMAL HIGH (ref 4.20–5.80)
RDW: 12.2 % (ref 11.0–15.0)
Total Lymphocyte: 33.5 %
WBC: 4.9 10*3/uL (ref 3.8–10.8)

## 2021-02-21 LAB — COMPLETE METABOLIC PANEL WITH GFR
AG Ratio: 1.6 (calc) (ref 1.0–2.5)
ALT: 17 U/L (ref 9–46)
AST: 16 U/L (ref 10–40)
Albumin: 4.3 g/dL (ref 3.6–5.1)
Alkaline phosphatase (APISO): 41 U/L (ref 36–130)
BUN: 14 mg/dL (ref 7–25)
CO2: 27 mmol/L (ref 20–32)
Calcium: 9.6 mg/dL (ref 8.6–10.3)
Chloride: 106 mmol/L (ref 98–110)
Creat: 1.25 mg/dL (ref 0.60–1.35)
GFR, Est African American: 78 mL/min/{1.73_m2} (ref >=60)
GFR, Est Non African American: 67 mL/min/{1.73_m2} (ref >=60)
Globulin: 2.7 g/dL (calc) (ref 1.9–3.7)
Glucose, Bld: 70 mg/dL (ref 65–99)
Potassium: 4.5 mmol/L (ref 3.5–5.3)
Sodium: 138 mmol/L (ref 135–146)
Total Bilirubin: 0.5 mg/dL (ref 0.2–1.2)
Total Protein: 7 g/dL (ref 6.1–8.1)

## 2021-02-21 LAB — T4, FREE: Free T4: 1.2 ng/dL (ref 0.8–1.8)

## 2021-02-21 LAB — TSH: TSH: 0.46 mIU/L (ref 0.40–4.50)

## 2021-02-21 LAB — HEPATITIS C ANTIBODY
Hepatitis C Ab: NONREACTIVE
SIGNAL TO CUT-OFF: 0.04 (ref <1.00)

## 2021-02-22 LAB — URINE CYTOLOGY ANCILLARY ONLY
Chlamydia: NEGATIVE
Comment: NEGATIVE
Comment: NEGATIVE
Comment: NORMAL
Neisseria Gonorrhea: NEGATIVE
Trichomonas: NEGATIVE

## 2021-02-23 ENCOUNTER — Telehealth: Payer: Self-pay

## 2021-02-23 NOTE — Telephone Encounter (Signed)
Tried to call no VM set up

## 2021-02-23 NOTE — Telephone Encounter (Signed)
Copied from CRM [id number]. Topic: General - Other >> Feb 22, 2021  4:30 PM Gwenlyn Fudge wrote: Reason for CRM: Pt returning call for lab results. Was advised by [facility] in office that there were no nurses. Advised pt and told him someone would reach back out to him. Please advise.

## 2021-02-25 ENCOUNTER — Emergency Department: Payer: 59

## 2021-02-25 ENCOUNTER — Emergency Department
Admission: EM | Admit: 2021-02-25 | Discharge: 2021-02-25 | Disposition: A | Discharge status: Home / Self Care | Payer: 59 | Attending: Emergency Medicine | Admitting: Emergency Medicine

## 2021-02-25 ENCOUNTER — Other Ambulatory Visit: Payer: Self-pay

## 2021-02-25 DIAGNOSIS — F17210 Nicotine dependence, cigarettes, uncomplicated: Secondary | ICD-10-CM | POA: Insufficient documentation

## 2021-02-25 DIAGNOSIS — R55 Syncope and collapse: Secondary | ICD-10-CM | POA: Diagnosis not present

## 2021-02-25 DIAGNOSIS — R42 Dizziness and giddiness: Secondary | ICD-10-CM | POA: Insufficient documentation

## 2021-02-25 DIAGNOSIS — R202 Paresthesia of skin: Secondary | ICD-10-CM | POA: Diagnosis not present

## 2021-02-25 DIAGNOSIS — M25511 Pain in right shoulder: Secondary | ICD-10-CM | POA: Insufficient documentation

## 2021-02-25 DIAGNOSIS — E876 Hypokalemia: Secondary | ICD-10-CM

## 2021-02-25 LAB — COMPREHENSIVE METABOLIC PANEL
ALT: 20 U/L (ref 0–44)
AST: 20 U/L (ref 15–41)
Albumin: 3.6 g/dL (ref 3.5–5.0)
Alkaline Phosphatase: 34 U/L — ABNORMAL LOW (ref 38–126)
Anion gap: 9 (ref 5–15)
BUN: 10 mg/dL (ref 6–20)
CO2: 20 mmol/L — ABNORMAL LOW (ref 22–32)
Calcium: 8.5 mg/dL — ABNORMAL LOW (ref 8.9–10.3)
Chloride: 109 mmol/L (ref 98–111)
Creatinine, Ser: 1.14 mg/dL (ref 0.61–1.24)
GFR, Estimated: >60 mL/min (ref >60)
Glucose, Bld: 127 mg/dL — ABNORMAL HIGH (ref 70–99)
Potassium: 3.2 mmol/L — ABNORMAL LOW (ref 3.5–5.1)
Sodium: 138 mmol/L (ref 135–145)
Total Bilirubin: 0.8 mg/dL (ref 0.3–1.2)
Total Protein: 6.4 g/dL — ABNORMAL LOW (ref 6.5–8.1)

## 2021-02-25 LAB — CBC WITH DIFFERENTIAL/PLATELET
Abs Immature Granulocytes: 0.02 10*3/uL (ref 0.00–0.07)
Basophils Absolute: 0 10*3/uL (ref 0.0–0.1)
Basophils Relative: 1 %
Eosinophils Absolute: 0.2 10*3/uL (ref 0.0–0.5)
Eosinophils Relative: 3 %
HCT: 47.2 % (ref 39.0–52.0)
Hemoglobin: 15.9 g/dL (ref 13.0–17.0)
Immature Granulocytes: 0 %
Lymphocytes Relative: 26 %
Lymphs Abs: 1.5 10*3/uL (ref 0.7–4.0)
MCH: 28.6 pg (ref 26.0–34.0)
MCHC: 33.7 g/dL (ref 30.0–36.0)
MCV: 84.9 fL (ref 80.0–100.0)
Monocytes Absolute: 0.7 10*3/uL (ref 0.1–1.0)
Monocytes Relative: 11 %
Neutro Abs: 3.4 10*3/uL (ref 1.7–7.7)
Neutrophils Relative %: 59 %
Platelets: 228 10*3/uL (ref 150–400)
RBC: 5.56 MIL/uL (ref 4.22–5.81)
RDW: 13.2 % (ref 11.5–15.5)
WBC: 5.8 10*3/uL (ref 4.0–10.5)
nRBC: 0 % (ref 0.0–0.2)

## 2021-02-25 LAB — URINALYSIS, COMPLETE (UACMP) WITH MICROSCOPIC
Bacteria, UA: NONE SEEN
Bilirubin Urine: NEGATIVE
Glucose, UA: NEGATIVE mg/dL
Hgb urine dipstick: NEGATIVE
Ketones, ur: NEGATIVE mg/dL
Leukocytes,Ua: NEGATIVE
Nitrite: NEGATIVE
Protein, ur: 30 mg/dL — AB
Specific Gravity, Urine: 1.008 (ref 1.005–1.030)
WBC, UA: NONE SEEN WBC/hpf (ref 0–5)
pH: 6 (ref 5.0–8.0)

## 2021-02-25 LAB — TROPONIN I (HIGH SENSITIVITY)
Troponin I (High Sensitivity): 3 ng/L (ref <18)
Troponin I (High Sensitivity): 3 ng/L (ref <18)

## 2021-02-25 LAB — LACTIC ACID, PLASMA: Lactic Acid, Venous: 1.8 mmol/L (ref 0.5–1.9)

## 2021-02-25 LAB — ETHANOL: Alcohol, Ethyl (B): <10 mg/dL (ref <10)

## 2021-02-25 MED ORDER — POTASSIUM CHLORIDE CRYS ER 20 MEQ PO TBCR
40.0000 meq | EXTENDED_RELEASE_TABLET | Freq: Once | ORAL | Status: AC
  Administered 2021-02-25: 40 meq via ORAL
  Filled 2021-02-25: qty 2

## 2021-02-25 MED ORDER — KETOROLAC TROMETHAMINE 30 MG/ML IJ SOLN
15.0000 mg | Freq: Once | INTRAMUSCULAR | Status: AC
  Administered 2021-02-25: 15 mg via INTRAVENOUS
  Filled 2021-02-25: qty 1

## 2021-02-25 MED ORDER — SODIUM CHLORIDE 0.9 % IV BOLUS
1000.0000 mL | Freq: Once | INTRAVENOUS | Status: AC
  Administered 2021-02-25: 1000 mL via INTRAVENOUS

## 2021-02-25 NOTE — ED Notes (Signed)
Patient taken to CT.

## 2021-02-25 NOTE — ED Provider Notes (Signed)
I assumed care of this patient approximately 0 700.  Please see outgoing providers note for full details regarding patient's initial evaluation assessment.  In brief he presents for episode of near syncope.  Initial work-up is reassuring including ECG, initial troponin, head CT and labs.  Patient is notably very slightly hypokalemic but this was repleted.  He was also given some IV fluids.  Plan is to follow-up repeat troponin if this is nonelevated patient is likely safe for discharge with outpatient follow-up.  On my assessment patient states he feels much better than he did last night.  Repeat troponin is nonelevated.  Given stable vitals otherwise reassuring work-up I believe he is safe for discharge.  Discharge stable condition.  Strict return cautions advised and discussed.    Gilles Chiquito, MD 02/25/21 1047

## 2021-02-25 NOTE — ED Notes (Signed)
Urine specimen requested, urinal at bedside.

## 2021-02-25 NOTE — ED Provider Notes (Signed)
[facility] Department Provider Note ____________________________________________   Event Date/Time   First MD Initiated Contact with Patient 02/25/21 [phone]     (approximate)  I have reviewed the triage vital signs and the nursing notes.   HISTORY  Chief Complaint Dizziness    HPI Grace Haggart is a 50 y.o. male with PMH as noted below including enlarged prostate, hernia, and migraines who presents with an episode of near syncope.  The patient states that he was going to the bathroom and started to feel dizzy, then suddenly fell forward.  He is not sure if he briefly lost consciousness but did hit his head.  He states that initially he was going to the bathroom to urinate, but then subsequently had a bowel movement.  He denies any chest pain or shortness of breath.  He has no headache.  The patient reports some left arm tingling since the syncopal event, as well as some pain in the right shoulder.  He also reports chronic suprapubic abdominal pain which is unchanged today.  He states he drank 4-5 beers last night but denies drug use.  He is prescribed Cialis but last took it yesterday afternoon.  Past Medical History:  Diagnosis Date  . Burning with urination 01-10-16  . Constipation   . Dysrhythmia    "OCCASSIONAL SPEEDING UP OF HEART RATE" PT THINKING IT MAY BE DUE TO HERNIA  . Headache    MIGRAINES  . Inguinal hernia    left     Patient Active Problem List   Diagnosis Date Noted  . Pancreas divisum 10/09/2018  . Constipation 10/09/2018  . Ventral hernia without obstruction or gangrene 10/09/2018  . Prostate cancer screening 09/16/2017  . Abdominal pain, periumbilical 12/20/2016  . Decreased frequency of bowel movements 01/10/2016  . Left paraspinal back pain 12/08/2015  . Dizziness, nonspecific 10/31/2015  . Abnormal TSH 10/31/2015  . Chronic pain of left groin 10/31/2015  . Abnormal EKG 10/31/2015  . Testicular pain, left 09/20/2015   . S/P unilateral inguinal hernia repair 09/20/2015    Past Surgical History:  Procedure Laterality Date  . HERNIA REPAIR Left 1998   AND UMBILICAL  . HERNIA REPAIR     50 years of age  . INGUINAL HERNIA REPAIR Right 01/11/2016   Procedure: HERNIA REPAIR INGUINAL ADULT;  Surgeon: Kieth Brightly, MD;  Location: [facility] ORS;  Service: General;  Laterality: Right;  . TONSILLECTOMY     childhood    Prior to Admission medications   Medication Sig Start Date End Date Taking? Authorizing Provider  acetaminophen (TYLENOL) 500 MG tablet Take 500 mg by mouth every 6 (six) hours as needed for mild pain.    [provider]  Multiple Vitamin (MULTIVITAMIN) tablet Take 1 tablet by mouth daily.    [provider]  tadalafil (CIALIS) 5 MG tablet Take 1 tablet (5 mg total) by mouth daily as needed for erectile dysfunction. 10/26/20   Michiel Cowboy A, PA-C    Allergies Patient has no known allergies.  Family History  Problem Relation Age of Onset  . Hypertension Maternal Grandmother   . Prostate cancer Neg Hx   . Kidney cancer Neg Hx   . Bladder Cancer Neg Hx     Social History Social History   Tobacco Use  . Smoking status: Light Tobacco Smoker    Types: Cigars, Cigarettes  . Smokeless tobacco: Never Used  Vaping Use  . Vaping Use: Never used  Substance Use Topics  .  Alcohol use: Yes    Alcohol/week: 0.0 standard drinks    Comment: occasional  . Drug use: No    Review of Systems  Constitutional: No fever. Eyes: No visual changes. ENT: No neck pain. Cardiovascular: Denies chest pain. Respiratory: Denies shortness of breath. Gastrointestinal: No vomiting or diarrhea. Genitourinary: Negative for dysuria.  Musculoskeletal: Negative for back pain. Skin: Negative for rash. Neurological: Negative for headaches, focal weakness or numbness.   ____________________________________________   PHYSICAL EXAM:  VITAL SIGNS: ED Triage Vitals  Enc Vitals  Group     BP 02/25/21 0432 115/82     Pulse Rate 02/25/21 0432 69     Resp 02/25/21 0432 16     Temp 02/25/21 0436 98.2 F (36.8 C)     Temp Source 02/25/21 0436 Oral     SpO2 02/25/21 0432 98 %     Weight 02/25/21 0433 187 lb 6.3 oz (85 kg)     Height 02/25/21 0433 6' (1.829 m)     Head Circumference --      Peak Flow --      Pain Score 02/25/21 0437 7     Pain Loc --      Pain Edu? --      Excl. in GC? --     Constitutional: Alert and oriented.  Relatively well appearing and in no acute distress. Eyes: Conjunctivae are normal.  EOMI.  PERRLA. Head: Atraumatic. Nose: No congestion/rhinnorhea. Mouth/Throat: Mucous membranes are moist.   Neck: Normal range of motion.  Cardiovascular: Normal rate, regular rhythm. Grossly normal heart sounds.  Good peripheral circulation. Respiratory: Normal respiratory effort.  No retractions. Lungs CTAB. Gastrointestinal: Soft and nontender. No distention.  Genitourinary: No flank tenderness. Musculoskeletal: No lower extremity edema.  Extremities warm and well perfused.  Neurologic:  Normal speech and language.  Motor and sensory intact in all extremities.  Normal coordination.  No ataxia.   Skin:  Skin is warm and dry. No rash noted. Psychiatric: Mood and affect are normal. Speech and behavior are normal.  ____________________________________________   LABS (all labs ordered are listed, but only abnormal results are displayed)  Labs Reviewed  COMPREHENSIVE METABOLIC PANEL - Abnormal; Notable for the following components:      Result Value   Potassium 3.2 (*)    CO2 20 (*)    Glucose, Bld 127 (*)    Calcium 8.5 (*)    Total Protein 6.4 (*)    Alkaline Phosphatase 34 (*)    All other components within normal limits  URINALYSIS, COMPLETE (UACMP) WITH MICROSCOPIC - Abnormal; Notable for the following components:   Color, Urine YELLOW (*)    APPearance CLEAR (*)    Protein, ur 30 (*)    All other components within normal limits   ETHANOL  CBC WITH DIFFERENTIAL/PLATELET  LACTIC ACID, PLASMA  TROPONIN I (HIGH SENSITIVITY)  TROPONIN I (HIGH SENSITIVITY)   ____________________________________________  EKG  ED ECG REPORT I, Dionne Bucy, the attending physician, personally viewed and interpreted this ECG.  Date: 02/25/2021 EKG Time: 0601 Rate: 51 Rhythm: normal sinus rhythm QRS Axis: normal Intervals: normal ST/T Wave abnormalities: Early repolarization Narrative Interpretation: no evidence of acute ischemia; no significant change when compared to EKG of 07/07/2020  ____________________________________________  RADIOLOGY  CT head: No ICH or other acute abnormality  ____________________________________________   PROCEDURES  Procedure(s) performed: No  Procedures  Critical Care performed: No ____________________________________________   INITIAL IMPRESSION / ASSESSMENT AND PLAN / ED COURSE  Pertinent labs & imaging  results that were available during my care of the patient were reviewed by me and considered in my medical decision making (see chart for details).  50 year old male with PMH as noted above presents with an apparent episode of near syncope this morning when he was going to the bathroom.  He denies fully losing consciousness and it does not appear that he hit his head or injured himself.  He subsequently developed some paresthesias to the left arm but no weakness or numbness.  He endorses alcohol use last night.  I reviewed the past medical records in Epic.  The patient was last seen in the ED last December for penile discharge and previously in August for side effects after COVID-19 vaccination.  He was last seen by primary care on 3/29 with no active issues.  On exam the patient is overall well-appearing.  His vital signs are normal.  The physical exam is overall unremarkable.  Neurologic exam is normal.  There are no deficits in the left arm.  The abdomen is soft and  nontender.  Differential for the near syncope includes vasovagal episode, hypovolemia, hypotension possibly exacerbated by his use of Cialis and/or alcohol, or less likely primary CNS cause.  The neurologic exam is normal, however given the combination of relatively sudden near syncope and unilateral paresthesias will obtain a CT head in addition to lab work-up.  I anticipate that if the labs are reassuring and the patient has no further symptoms he may be appropriate for discharge home.  ----------------------------------------- 7:06 AM on 02/25/2021 -----------------------------------------  The lab work-up is unremarkable.  Initial troponin is negative.  CT is normal.  Plan will be to repeat the troponin since the patient came in shortly after the syncopal event.  His vital signs have remained stable.  On reassessment, the patient states that he feels better.  He still has mild paresthesias in the left arm and also now reports some soreness in his left trapezius area.  I suspect that he may have pulled the trapezius muscle or developed mild radiculopathy after his syncopal event.  If the repeat troponin is negative and the patient continues to feel well, he will be appropriate for discharge home.  I signed him out to the oncoming ED physician Dr. Katrinka Blazing.  ____________________________________________   FINAL CLINICAL IMPRESSION(S) / ED DIAGNOSES  Final diagnoses:  Syncope, unspecified syncope type  Paresthesias      NEW MEDICATIONS STARTED DURING THIS VISIT:  New Prescriptions   No medications on file     Note:  This document was prepared using Dragon voice recognition software and may include unintentional dictation errors.    Dionne Bucy, MD 02/25/21 [phone]

## 2021-02-25 NOTE — ED Triage Notes (Signed)
EMS brought in from home for dizziness. Reports falling when ambulating to restroom.Denies LOC. Orthostatic changes when standing, systolic BP 96. Denies head injury. Reports drinking 4 beers tonight. Also complaining of numbness/tingling in left arm. Denies injury.   EMS gave NS 300cc en route.

## 2021-03-04 DIAGNOSIS — Z1211 Encounter for screening for malignant neoplasm of colon: Secondary | ICD-10-CM | POA: Insufficient documentation

## 2021-03-04 DIAGNOSIS — Z789 Other specified health status: Secondary | ICD-10-CM | POA: Insufficient documentation

## 2021-03-04 DIAGNOSIS — Z79899 Other long term (current) drug therapy: Secondary | ICD-10-CM | POA: Insufficient documentation

## 2021-03-04 DIAGNOSIS — G894 Chronic pain syndrome: Secondary | ICD-10-CM | POA: Insufficient documentation

## 2021-03-04 DIAGNOSIS — M899 Disorder of bone, unspecified: Secondary | ICD-10-CM | POA: Insufficient documentation

## 2021-03-04 NOTE — Progress Notes (Deleted)
No-show to initial evaluation on 03/05/2021.

## 2021-03-05 ENCOUNTER — Ambulatory Visit: Payer: 59 | Attending: Pain Medicine | Admitting: Pain Medicine

## 2021-03-05 DIAGNOSIS — G894 Chronic pain syndrome: Secondary | ICD-10-CM

## 2021-03-05 DIAGNOSIS — Z789 Other specified health status: Secondary | ICD-10-CM

## 2021-03-05 DIAGNOSIS — Z79899 Other long term (current) drug therapy: Secondary | ICD-10-CM

## 2021-03-05 DIAGNOSIS — M899 Disorder of bone, unspecified: Secondary | ICD-10-CM

## 2021-03-14 ENCOUNTER — Encounter: Payer: Self-pay | Admitting: Emergency Medicine

## 2021-03-14 ENCOUNTER — Ambulatory Visit
Admission: EM | Admit: 2021-03-14 | Discharge: 2021-03-14 | Disposition: A | Discharge status: Home / Self Care | Payer: 59 | Attending: Family Medicine | Admitting: Family Medicine

## 2021-03-14 ENCOUNTER — Other Ambulatory Visit: Payer: Self-pay

## 2021-03-14 DIAGNOSIS — R102 Pelvic and perineal pain: Secondary | ICD-10-CM

## 2021-03-14 DIAGNOSIS — M722 Plantar fascial fibromatosis: Secondary | ICD-10-CM | POA: Insufficient documentation

## 2021-03-14 DIAGNOSIS — M79672 Pain in left foot: Secondary | ICD-10-CM

## 2021-03-14 DIAGNOSIS — N401 Enlarged prostate with lower urinary tract symptoms: Secondary | ICD-10-CM | POA: Diagnosis present

## 2021-03-14 DIAGNOSIS — G8929 Other chronic pain: Secondary | ICD-10-CM | POA: Insufficient documentation

## 2021-03-14 DIAGNOSIS — R300 Dysuria: Secondary | ICD-10-CM | POA: Diagnosis not present

## 2021-03-14 LAB — URINALYSIS, COMPLETE (UACMP) WITH MICROSCOPIC
Bilirubin Urine: NEGATIVE
Glucose, UA: NEGATIVE mg/dL
Hgb urine dipstick: NEGATIVE
Ketones, ur: NEGATIVE mg/dL
Leukocytes,Ua: NEGATIVE
Nitrite: NEGATIVE
Protein, ur: NEGATIVE mg/dL
Specific Gravity, Urine: 1.015 (ref 1.005–1.030)
pH: 6 (ref 5.0–8.0)

## 2021-03-14 LAB — CHLAMYDIA/NGC RT PCR (ARMC ONLY)
Chlamydia Tr: NOT DETECTED
N gonorrhoeae: NOT DETECTED

## 2021-03-14 MED ORDER — DICLOFENAC SODIUM 75 MG PO TBEC: 75.0000 mg | DELAYED_RELEASE_TABLET | Freq: Two times a day (BID) | ORAL | 0 refills | Status: AC

## 2021-03-14 NOTE — ED Triage Notes (Signed)
Patient c/o left foot pain that started yesterday afternoon and states the pain has worsened today. Denies injury. Patient also reports burning with urination x 1 month. Patient reports he has a history of BPH. Also requesting STD testing.

## 2021-03-14 NOTE — Discharge Instructions (Signed)
The urinalysis does not show signs of UTI.  I will send the urine for culture and if bacteria grows, can send an antibiotic for you.  We have also sent testing for gonorrhea, chlamydia and trichomonas which are all STIs.  The results of that will be available through MyChart but someone will call you if anything is positive.  Your symptoms are likely related to your chronic pelvic pain and chronic dysuria.  If the symptoms continue to bother you I would advise following up with your urologist.  Your foot pain is likely due to plantar fasciitis.  Please see handout.  We discussed you purchasing over-the-counter plantar fasciitis night splints and stretching.  Also discussed use of tennis ball and rolling your arch over the tennis ball.  I have sent an anti-inflammatory medication for you.  You can also take Tylenol if you need to.  Can ice your foot.  I have given you a work note for the next couple of days.  Please follow-up with [facility] walk-in orthopedic urgent care in [facility] if the symptoms continue or worsen over the next 1 to 2 weeks.

## 2021-03-14 NOTE — ED Provider Notes (Signed)
[facility]    CSN: [id number] Arrival date & time: 03/14/21  1641      History   Chief Complaint Chief Complaint  Patient presents with  . Foot Pain  . Dysuria       . STD testing    HPI Joseph Strong is a 50 y.o. male presenting with multiple complaints.  Patient states that he has had left plantar foot pain since yesterday.  Denies any specific injury.  Patient states that he has to work/walk around on hard concrete floor all day.  He works in a factory type job.  Patient says the pain is severe.  He has taken ibuprofen.  He says that he continue to work yesterday but is having more difficulty today due to the discomfort.  Patient states he has had similar problem in the past and was told that his arch dropped.  He denies any numbness, weakness or tingling.  Additionally, patient complains of approximately 1 month history of dysuria.  Patient says about 4 months ago he had unprotected intercourse with a male.  He says he has been STI tested since then but is still concerned about possible STIs.  He requests STI testing and urinalysis today.  Patient has a significant past medical history for BPH with LUTS, chronic pelvic pain and dysuria, and bilateral hernia repair of the inguinal region.  Patient says that he deals with pain in the pelvic region and dysuria that comes and goes.  He says this has been ongoing for about 4 years.  Patient says symptoms are a little bit worse in the past month.  Denies any significant pain.  No fevers.  He denies any testicular pain or swelling.  No skin rashes or lesions.  No urethral discharge.  Patient does see [facility] in [place].  He said last saw them in February 2022.  No other complaints or concerns.  HPI  Past Medical History:  Diagnosis Date  . Burning with urination 01-10-16  . Constipation   . Dysrhythmia    "OCCASSIONAL SPEEDING UP OF HEART RATE" PT THINKING IT MAY BE DUE TO HERNIA   . Headache    MIGRAINES  . Inguinal hernia    left     Patient Active Problem List   Diagnosis Date Noted  . Chronic pain syndrome 03/04/2021  . Pharmacologic therapy 03/04/2021  . Disorder of skeletal system 03/04/2021  . Problems influencing health status 03/04/2021  . Pancreas divisum 10/09/2018  . Constipation 10/09/2018  . Ventral hernia without obstruction or gangrene 10/09/2018  . Prostate cancer screening 09/16/2017  . Abdominal pain, periumbilical 12/20/2016  . Decreased frequency of bowel movements 01/10/2016  . Left paraspinal back pain 12/08/2015  . Dizziness, nonspecific 10/31/2015  . Abnormal TSH 10/31/2015  . Chronic pain of left groin 10/31/2015  . Abnormal EKG 10/31/2015  . Testicular pain, left 09/20/2015  . S/P unilateral inguinal hernia repair 09/20/2015    Past Surgical History:  Procedure Laterality Date  . HERNIA REPAIR Left 1998   AND UMBILICAL  . HERNIA REPAIR     50 years of age  . INGUINAL HERNIA REPAIR Right 01/11/2016   Procedure: HERNIA REPAIR INGUINAL ADULT;  Surgeon: Kieth BrightlySeeplaputhur G Sankar, MD;  Location: [facility] ORS;  Service: General;  Laterality: Right;  . TONSILLECTOMY     childhood       Home Medications    Prior to Admission medications   Medication Sig Start Date End Date Taking? Authorizing Provider  diclofenac (VOLTAREN) 75 MG EC tablet Take 1 tablet (75 mg total) by mouth 2 (two) times daily for 15 days. 03/14/21 03/29/21 Yes Eusebio Friendly B, PA-C  tadalafil (CIALIS) 5 MG tablet Take 1 tablet (5 mg total) by mouth daily as needed for erectile dysfunction. 10/26/20  Yes McGowan, Carollee Herter A, PA-C  acetaminophen (TYLENOL) 500 MG tablet Take 500 mg by mouth every 6 (six) hours as needed for mild pain.    [provider]  Multiple Vitamin (MULTIVITAMIN) tablet Take 1 tablet by mouth daily.    [provider]    Family History Family History  Problem Relation Age of Onset  . Hypertension Maternal Grandmother   .  Prostate cancer Neg Hx   . Kidney cancer Neg Hx   . Bladder Cancer Neg Hx     Social History Social History   Tobacco Use  . Smoking status: Light Tobacco Smoker    Types: Cigars, Cigarettes  . Smokeless tobacco: Never Used  Vaping Use  . Vaping Use: Never used  Substance Use Topics  . Alcohol use: Yes    Alcohol/week: 0.0 standard drinks    Comment: occasional  . Drug use: No     Allergies   Patient has no known allergies.   Review of Systems Review of Systems  Constitutional: Negative for fatigue and fever.  Gastrointestinal: Negative for abdominal pain, nausea and vomiting.  Genitourinary: Positive for dysuria. Negative for difficulty urinating, flank pain, frequency, genital sores, hematuria, penile discharge, penile pain and urgency.  Musculoskeletal: Positive for arthralgias (L foot pain). Negative for gait problem and joint swelling.  Neurological: Negative for weakness and numbness.     Physical Exam Triage Vital Signs ED Triage Vitals  Enc Vitals Group     BP 03/14/21 1656 114/73     Pulse Rate 03/14/21 1656 85     Resp 03/14/21 1656 18     Temp 03/14/21 1656 98 F (36.7 C)     Temp Source 03/14/21 1656 Oral     SpO2 03/14/21 1656 99 %     Weight 03/14/21 1654 188 lb (85.3 kg)     Height 03/14/21 1654 6' (1.829 m)     Head Circumference --      Peak Flow --      Pain Score 03/14/21 1654 8     Pain Loc --      Pain Edu? --      Excl. in GC? --    No data found.  Updated Vital Signs BP 114/73 (BP Location: Right Arm)   Pulse 85   Temp 98 F (36.7 C) (Oral)   Resp 18   Ht 6' (1.829 m)   Wt 188 lb (85.3 kg)   SpO2 99%   BMI 25.50 kg/m        Physical Exam Vitals and nursing note reviewed.  Constitutional:      General: He is not in acute distress.    Appearance: Normal appearance. He is well-developed. He is not ill-appearing.  HENT:     Head: Normocephalic and atraumatic.  Eyes:     General: No scleral icterus.     Conjunctiva/sclera: Conjunctivae normal.  Cardiovascular:     Rate and Rhythm: Normal rate and regular rhythm.     Heart sounds: Normal heart sounds.  Pulmonary:     Effort: Pulmonary effort is normal. No respiratory distress.     Breath sounds: Normal breath sounds.  Abdominal:     Palpations: Abdomen is  soft.     Tenderness: There is no abdominal tenderness. There is no right CVA tenderness or left CVA tenderness.  Musculoskeletal:     Cervical back: Neck supple.     Left foot: Normal range of motion. Swelling (mild swelling plantar arch) and tenderness (TTP origin of plantar fascia ) present.  Skin:    General: Skin is warm and dry.  Neurological:     General: No focal deficit present.     Mental Status: He is alert. Mental status is at baseline.     Motor: No weakness.     Gait: Gait normal.  Psychiatric:        Mood and Affect: Mood normal.        Behavior: Behavior normal.        Thought Content: Thought content normal.      UC Treatments / Results  Labs (all labs ordered are listed, but only abnormal results are displayed) Labs Reviewed  URINALYSIS, COMPLETE (UACMP) WITH MICROSCOPIC - Abnormal; Notable for the following components:      Result Value   Bacteria, UA RARE (*)    All other components within normal limits  CHLAMYDIA/NGC RT PCR ([facility] ONLY)  TRICHOMONAS VAGINALIS, PROBE AMP  URINE CULTURE    EKG   Radiology No results found.  Procedures Procedures (including critical care time)  Medications Ordered in UC Medications - No data to display  Initial Impression / Assessment and Plan / UC Course  I have reviewed the triage vital signs and the nursing notes.  Pertinent labs & imaging results that were available during my care of the patient were reviewed by me and considered in my medical decision making (see chart for details).    50 year old male presenting with multiple complaints including left foot pain and dysuria.  Patient also  concerned about possible STIs.  Clinical presentation consistent for plantar fasciitis of the left foot.  Supportive care at this time.  I did send diclofenac sodium and advised he can also take Tylenol.  Discussed use of plantar fascia splints and stretches.  Advised to follow-up with Ortho if not improving over the next couple weeks or for any worsening pain.  I did give him a work note for the next couple of days.  Urinalysis today not consistent with UTI.  I will send the urine for culture and treat for UTI if positive.  GC/chlamydia testing as well as trichomonas testing performed.  Patient advised how to access results.  He denies any significant concern and would like to hold off on treatment for STIs at this time.  Review of urology note from 01/03/2021 notes that patient has chronic pelvic pain and dysuria.  He has a history of BPH with LUTS and bilateral inguinal hernia repair with likely genitofemoral nerve irritation.  Patient has had 2 negative scrotal ultrasounds, 2 negative CT scans, and an MRI.  He is awaiting colonoscopy. Patient had a PSA and prostate exam in February 2022. PSA normal at 0.7.   Advised patient that if his labs come back as normal then he should contact his urologist for another appointment to see if he requires any further work-up or additional treatment for his symptoms.  Suspect that his dysuria is likely due to chronic condition.  Advised him to increase rest and fluids at this time.  ED precautions for urinary symptoms reviewed with patient.   Final Clinical Impressions(s) / UC Diagnoses   Final diagnoses:  Plantar fasciitis, left  Foot pain, left  Dysuria  Chronic pelvic pain in male  Benign prostatic hyperplasia with lower urinary tract symptoms, symptom details unspecified     Discharge Instructions     The urinalysis does not show signs of UTI.  I will send the urine for culture and if bacteria grows, can send an antibiotic for you.  We have also sent  testing for gonorrhea, chlamydia and trichomonas which are all STIs.  The results of that will be available through MyChart but someone will call you if anything is positive.  Your symptoms are likely related to your chronic pelvic pain and chronic dysuria.  If the symptoms continue to bother you I would advise following up with your urologist.  Your foot pain is likely due to plantar fasciitis.  Please see handout.  We discussed you purchasing over-the-counter plantar fasciitis night splints and stretching.  Also discussed use of tennis ball and rolling your arch over the tennis ball.  I have sent an anti-inflammatory medication for you.  You can also take Tylenol if you need to.  Can ice your foot.  I have given you a work note for the next couple of days.  Please follow-up with [facility] walk-in [facility] urgent care in [facility] if the symptoms continue or worsen over the next 1 to 2 weeks.    ED Prescriptions    Medication Sig Dispense Auth. Provider   diclofenac (VOLTAREN) 75 MG EC tablet Take 1 tablet (75 mg total) by mouth 2 (two) times daily for 15 days. 30 tablet Shirlee Latch, PA-C     I have reviewed the PDMP during this encounter.   Shirlee Latch, PA-C 03/14/21 1905

## 2021-03-16 LAB — URINE CULTURE: Culture: NO GROWTH

## 2021-03-17 LAB — TRICHOMONAS VAGINALIS, PROBE AMP: Trich vag by NAA: NEGATIVE

## 2021-03-28 ENCOUNTER — Encounter: Payer: Self-pay | Admitting: Gastroenterology

## 2021-03-28 ENCOUNTER — Telehealth: Payer: 59

## 2021-03-28 ENCOUNTER — Telehealth: Payer: Self-pay

## 2021-03-28 NOTE — Telephone Encounter (Signed)
Unable to contact patient for his triage call.  No voicemail has been set up.  3 attempts were made.  Thanks,  [place], [place]

## 2021-04-03 ENCOUNTER — Telehealth (INDEPENDENT_AMBULATORY_CARE_PROVIDER_SITE_OTHER): Payer: 59 | Admitting: Gastroenterology

## 2021-04-03 ENCOUNTER — Encounter: Payer: Self-pay | Admitting: Gastroenterology

## 2021-04-03 NOTE — Progress Notes (Signed)
Attempted to contact patient three times 1130, 1136, and 1140. No answer and unable to LVM d/t voice mail not being set up.

## 2021-04-04 ENCOUNTER — Ambulatory Visit: Payer: PRIVATE HEALTH INSURANCE | Admitting: Surgery

## 2021-04-07 ENCOUNTER — Encounter: Payer: Self-pay | Admitting: Intensive Care

## 2021-04-07 ENCOUNTER — Other Ambulatory Visit: Payer: Self-pay

## 2021-04-07 ENCOUNTER — Emergency Department: Payer: 59

## 2021-04-07 ENCOUNTER — Emergency Department
Admission: EM | Admit: 2021-04-07 | Discharge: 2021-04-07 | Disposition: A | Discharge status: Home / Self Care | Payer: 59 | Attending: Emergency Medicine | Admitting: Emergency Medicine

## 2021-04-07 DIAGNOSIS — F17210 Nicotine dependence, cigarettes, uncomplicated: Secondary | ICD-10-CM | POA: Diagnosis not present

## 2021-04-07 DIAGNOSIS — Z1152 Encounter for screening for COVID-19: Secondary | ICD-10-CM

## 2021-04-07 DIAGNOSIS — M545 Low back pain, unspecified: Secondary | ICD-10-CM | POA: Diagnosis not present

## 2021-04-07 DIAGNOSIS — Z20822 Contact with and (suspected) exposure to covid-19: Secondary | ICD-10-CM | POA: Diagnosis not present

## 2021-04-07 DIAGNOSIS — R519 Headache, unspecified: Secondary | ICD-10-CM | POA: Diagnosis not present

## 2021-04-07 HISTORY — DX: Benign prostatic hyperplasia without lower urinary tract symptoms: N40.0

## 2021-04-07 LAB — URINALYSIS, COMPLETE (UACMP) WITH MICROSCOPIC
Bacteria, UA: NONE SEEN
Bilirubin Urine: NEGATIVE
Glucose, UA: NEGATIVE mg/dL
Hgb urine dipstick: NEGATIVE
Ketones, ur: NEGATIVE mg/dL
Leukocytes,Ua: NEGATIVE
Nitrite: NEGATIVE
Protein, ur: NEGATIVE mg/dL
Specific Gravity, Urine: 1.026 (ref 1.005–1.030)
pH: 5 (ref 5.0–8.0)

## 2021-04-07 LAB — CHLAMYDIA/NGC RT PCR (ARMC ONLY)
Chlamydia Tr: NOT DETECTED
N gonorrhoeae: NOT DETECTED

## 2021-04-07 MED ORDER — KETOROLAC TROMETHAMINE 30 MG/ML IJ SOLN
30.0000 mg | Freq: Once | INTRAMUSCULAR | Status: AC
  Administered 2021-04-07: 30 mg via INTRAMUSCULAR
  Filled 2021-04-07: qty 1

## 2021-04-07 MED ORDER — METHOCARBAMOL 500 MG PO TABS: 500.0000 mg | ORAL_TABLET | Freq: Four times a day (QID) | ORAL | 0 refills | Status: DC | PRN

## 2021-04-07 MED ORDER — NAPROXEN 500 MG PO TABS: 500.0000 mg | ORAL_TABLET | Freq: Two times a day (BID) | ORAL | 0 refills | Status: DC

## 2021-04-07 NOTE — ED Provider Notes (Signed)
[facility] Emergency Department Provider Note  ____________________________________________   Event Date/Time   First MD Initiated Contact with Patient 04/07/21 1137     (approximate)  I have reviewed the triage vital signs and the nursing notes.   HISTORY  Chief Complaint Back Pain   HPI Joseph Strong is a 50 y.o. male presents to the ED with complaint of lower back and buttocks pain for the last several days.  Patient states the pain radiates down into his buttocks but denies that it runs into his legs.  He states he was having difficulty standing completely straight especially this morning when he got out of bed.  There is been no history of injury.  Patient does have a history of scoliosis.  He also complains of headache for several days and is worried about COVID.  He denies any fever, chills, nausea or vomiting.  There is been no cough, change in taste or smell.  His pain as 7 out of 10.      Past Medical History:  Diagnosis Date  . Burning with urination 01-10-16  . Constipation   . Dysrhythmia    "OCCASSIONAL SPEEDING UP OF HEART RATE" PT THINKING IT MAY BE DUE TO HERNIA  . Enlarged prostate   . Headache    MIGRAINES  . Inguinal hernia    left     Patient Active Problem List   Diagnosis Date Noted  . Chronic pain syndrome 03/04/2021  . Pharmacologic therapy 03/04/2021  . Disorder of skeletal system 03/04/2021  . Problems influencing health status 03/04/2021  . Pancreas divisum 10/09/2018  . Constipation 10/09/2018  . Ventral hernia without obstruction or gangrene 10/09/2018  . Prostate cancer screening 09/16/2017  . Abdominal pain, periumbilical 12/20/2016  . Decreased frequency of bowel movements 01/10/2016  . Left paraspinal back pain 12/08/2015  . Dizziness, nonspecific 10/31/2015  . Abnormal TSH 10/31/2015  . Chronic pain of left groin 10/31/2015  . Abnormal EKG 10/31/2015  . Testicular pain, left 09/20/2015  . S/P unilateral  inguinal hernia repair 09/20/2015    Past Surgical History:  Procedure Laterality Date  . HERNIA REPAIR Left 1998   AND UMBILICAL  . HERNIA REPAIR     50 years of age  . INGUINAL HERNIA REPAIR Right 01/11/2016   Procedure: HERNIA REPAIR INGUINAL ADULT;  Surgeon: Kieth Brightly, MD;  Location: [facility] ORS;  Service: General;  Laterality: Right;  . TONSILLECTOMY     childhood    Prior to Admission medications   Medication Sig Start Date End Date Taking? Authorizing Provider  methocarbamol (ROBAXIN) 500 MG tablet Take 1 tablet (500 mg total) by mouth every 6 (six) hours as needed. 04/07/21  Yes Tommi Rumps, PA-C  naproxen (NAPROSYN) 500 MG tablet Take 1 tablet (500 mg total) by mouth 2 (two) times daily with a meal. 04/07/21  Yes Bridget Hartshorn L, PA-C  acetaminophen (TYLENOL) 500 MG tablet Take 500 mg by mouth every 6 (six) hours as needed for mild pain.    [provider]  Multiple Vitamin (MULTIVITAMIN) tablet Take 1 tablet by mouth daily.    [provider]  tadalafil (CIALIS) 5 MG tablet Take 1 tablet (5 mg total) by mouth daily as needed for erectile dysfunction. 10/26/20   Michiel Cowboy A, PA-C    Allergies Patient has no known allergies.  Family History  Problem Relation Age of Onset  . Hypertension Maternal Grandmother   . Prostate cancer Neg Hx   .  Kidney cancer Neg Hx   . Bladder Cancer Neg Hx     Social History Social History   Tobacco Use  . Smoking status: Current Every Day Smoker    Types: Cigars, Cigarettes  . Smokeless tobacco: Never Used  Vaping Use  . Vaping Use: Never used  Substance Use Topics  . Alcohol use: Yes    Alcohol/week: 0.0 standard drinks    Comment: occasional  . Drug use: No    Review of Systems Constitutional: No fever/chills Eyes: No visual changes. ENT: No sore throat. Cardiovascular: Denies chest pain. Respiratory: Denies shortness of breath. Gastrointestinal: No abdominal pain.  No nausea, no  vomiting.  No diarrhea.  No constipation. Genitourinary: Negative for dysuria. Musculoskeletal: Positive for back pain. Skin: Negative for rash. Neurological: Positive for headache, negative for focal weakness or numbness. ____________________________________________   PHYSICAL EXAM:  VITAL SIGNS: ED Triage Vitals  Enc Vitals Group     BP 04/07/21 1051 (!) 139/95     Pulse Rate 04/07/21 1051 88     Resp 04/07/21 1051 16     Temp 04/07/21 1051 98.2 F (36.8 C)     Temp Source 04/07/21 1051 Oral     SpO2 04/07/21 1051 98 %     Weight 04/07/21 1052 185 lb (83.9 kg)     Height 04/07/21 1052 6' (1.829 m)     Head Circumference --      Peak Flow --      Pain Score 04/07/21 1052 7     Pain Loc --      Pain Edu? --      Excl. in GC? --     Constitutional: Alert and oriented. Well appearing and in no acute distress. Eyes: Conjunctivae are normal. PERRL. EOMI. Head: Atraumatic. Nose: No congestion/rhinnorhea. Mouth/Throat: Mucous membranes are moist.  Oropharynx non-erythematous. Neck: No stridor.   Cardiovascular: Normal rate, regular rhythm. Grossly normal heart sounds.  Good peripheral circulation. Respiratory: Normal respiratory effort.  No retractions. Lungs CTAB. Gastrointestinal: Soft and nontender. No distention.  Musculoskeletal: No tenderness is noted on palpation of the the cervical spine or thoracic spine.  There is some curvature noted on palpation of the lumbar spine but no step-offs are appreciated.  Patient is moderately tender bilateral paravertebral muscles.  Range of motion is slow and guarded secondary to discomfort.  Good muscle strength bilaterally lower extremities 5/5.  Patient is ambulatory without any assistance is able to get from sitting to standing without any assistance. Neurologic:  Normal speech and language. No gross focal neurologic deficits are appreciated. No gait instability. Skin:  Skin is warm, dry and intact. No rash noted. Psychiatric: Mood and  affect are normal. Speech and behavior are normal.  ____________________________________________   LABS (all labs ordered are listed, but only abnormal results are displayed)  Labs Reviewed  URINALYSIS, COMPLETE (UACMP) WITH MICROSCOPIC - Abnormal; Notable for the following components:      Result Value   Color, Urine YELLOW (*)    APPearance CLEAR (*)    All other components within normal limits  CHLAMYDIA/NGC RT PCR ([facility] ONLY)  SARS CORONAVIRUS 2 (TAT 6-24 HRS)    RADIOLOGY I, Tommi Rumps, personally viewed and evaluated these images (plain radiographs) as part of my medical decision making, as well as reviewing the written report by the radiologist.  Official radiology report(s): DG Lumbar Spine 2-3 Views  Result Date: 04/07/2021 CLINICAL DATA:  Low back pain radiating into both lower extremities which began approximately  1 week ago. EXAM: LUMBAR SPINE - 2-3 VIEW COMPARISON:  None. FINDINGS: Five non-rib-bearing lumbar vertebrae with anatomic alignment. Straightening of the usual lordosis and slight thoracolumbar levoscoliosis. No fractures. Well-preserved disc spaces. No visible posterior element hypertrophy. Sacroiliac joints anatomically aligned without degenerative changes. IMPRESSION: 1. No acute or significant abnormality. 2. Straightening of the usual lordosis which may reflect positioning and/or spasm. Electronically Signed   By: Hulan Saas M.D.   On: 04/07/2021 12:45    ____________________________________________   PROCEDURES  Procedure(s) performed (including Critical Care):  Procedures   ____________________________________________   INITIAL IMPRESSION / ASSESSMENT AND PLAN / ED COURSE  As part of my medical decision making, I reviewed the following data within the electronic MEDICAL RECORD NUMBER Notes from prior ED visits and [place] Controlled Substance Database  50 year old male presents to the ED with complaint of low back pain that radiates  to his buttocks but stops before it goes down his legs as previously stated.  Patient also is concerned about having COVID and also complains of a headache.  He has multiple complaints of urinary symptoms.  Lumbar spine x-ray was negative for any acute changes other than it appears that he is having some muscle spasms.  Urinalysis was clear and no gonorrhea or chlamydia was noted.  COVID test was still pending at the time of discharge and patient is aware that he can get this information on MyChart.  Patient was given Toradol 30 mg IM and continue to be ambulatory.  He was discharged with prescription for naproxen 500 mg twice daily and methocarbamol 500 mg every 6 hours as needed for muscle spasms.  He is encouraged to follow-up with his PCP if any continued problems.  He is aware that he needs to quarantine until he receives results of his COVID test.  If he is positive he is aware that he needs to stay quarantine for 5 days and also make people that he has been around aware that of his test results.  ____________________________________________   FINAL CLINICAL IMPRESSION(S) / ED DIAGNOSES  Final diagnoses:  Acute bilateral low back pain without sciatica  Encounter for screening for COVID-19     ED Discharge Orders         Ordered    naproxen (NAPROSYN) 500 MG tablet  2 times daily with meals        04/07/21 1432    methocarbamol (ROBAXIN) 500 MG tablet  Every 6 hours PRN        04/07/21 1432          *Please note:  Dj Senteno was evaluated in Emergency Department on 04/07/2021 for the symptoms described in the history of present illness. He was evaluated in the context of the global COVID-19 pandemic, which necessitated consideration that the patient might be at risk for infection with the SARS-CoV-2 virus that causes COVID-19. Institutional protocols and algorithms that pertain to the evaluation of patients at risk for COVID-19 are in a state of rapid change based on information  released by regulatory bodies including the CDC and federal and state organizations. These policies and algorithms were followed during the patient's care in the ED.  Some ED evaluations and interventions may be delayed as a result of limited staffing during and the pandemic.*   Note:  This document was prepared using Dragon voice recognition software and may include unintentional dictation errors.    Tommi Rumps, PA-C 04/07/21 1539    Jene Every, MD 04/07/21 [phone]

## 2021-04-07 NOTE — ED Triage Notes (Signed)
Patient c/o lower back/buttocks pain that radiates down legs. Also reports headache for a few days.

## 2021-04-07 NOTE — Discharge Instructions (Addendum)
Follow-up with your primary care provider if any continued problems.  You may use ice or heat to your back as needed for discomfort.  Drink lots of fluids to stay hydrated.  Follow-up with your primary care provider if any continued problems.  Results of your COVID test can be seen on MyChart in approximately another 4 hours.  Until you have received the results of your test you should quarantine.  If your test is positive you will need to quarantine an additional 5 days and notify anyone you have been around.

## 2021-04-08 LAB — SARS CORONAVIRUS 2 (TAT 6-24 HRS): SARS Coronavirus 2: NEGATIVE

## 2021-04-11 ENCOUNTER — Ambulatory Visit: Payer: 59

## 2021-04-12 ENCOUNTER — Other Ambulatory Visit: Payer: Self-pay

## 2021-04-12 ENCOUNTER — Ambulatory Visit
Admission: RE | Admit: 2021-04-12 | Discharge: 2021-04-12 | Disposition: A | Discharge status: Home / Self Care | Payer: 59 | Source: Ambulatory Visit | Attending: Sports Medicine | Admitting: Sports Medicine

## 2021-04-12 VITALS — BP 125/89 | HR 94 | Temp 98.0°F | Resp 18 | Ht 72.0 in | Wt 180.0 lb

## 2021-04-12 DIAGNOSIS — H9313 Tinnitus, bilateral: Secondary | ICD-10-CM

## 2021-04-12 DIAGNOSIS — R519 Headache, unspecified: Secondary | ICD-10-CM

## 2021-04-12 DIAGNOSIS — R0981 Nasal congestion: Secondary | ICD-10-CM

## 2021-04-12 DIAGNOSIS — R300 Dysuria: Secondary | ICD-10-CM | POA: Diagnosis not present

## 2021-04-12 DIAGNOSIS — H9113 Presbycusis, bilateral: Secondary | ICD-10-CM

## 2021-04-12 LAB — URINALYSIS, COMPLETE (UACMP) WITH MICROSCOPIC
Bilirubin Urine: NEGATIVE
Glucose, UA: NEGATIVE mg/dL
Hgb urine dipstick: NEGATIVE
Ketones, ur: NEGATIVE mg/dL
Leukocytes,Ua: NEGATIVE
Nitrite: NEGATIVE
Protein, ur: NEGATIVE mg/dL
Specific Gravity, Urine: 1.02 (ref 1.005–1.030)
pH: 7 (ref 5.0–8.0)

## 2021-04-12 MED ORDER — FLUTICASONE PROPIONATE 50 MCG/ACT NA SUSP: 2.0000 | Freq: Every day | NASAL | 0 refills | Status: DC

## 2021-04-12 NOTE — ED Triage Notes (Addendum)
Patient c/o bilateral ears ringing that started 1 week ago. He also reports pain behind his right eye that started 1 week ago.  He reports an enlarged prostate and states sometimes when he urinates this burns.

## 2021-04-12 NOTE — Discharge Instructions (Addendum)
As we discussed, you have a diagnosis called tinnitus.  I gave you an educational handout on it.  Please read it and try to modify some of the modifiable reasons for you to have it. Also you are having right-sided headache with some discomfort behind her right eye.  Your neurological exam is within normal limits.  You do not have any vision changes. This may be entirely due to some fluid behind your ears given you all of the symptoms.  We will try a nasal steroid. I encourage you to get into see your primary care provider to discuss this further and see whether or not you need subspecialty referral or further testing. Your urine was negative.  You tell me that urology is told you have an enlarged prostate and you are having burning on urination.  Please drink plenty of fluids.  Lots of water.  If this persist please let your primary care provider know.  You may also want to follow-up with urology as well.  Nothing on the urinalysis to treat today.

## 2021-04-12 NOTE — ED Provider Notes (Signed)
[facility]    CSN: [id number] Arrival date & time: 04/12/21  1700      History   Chief Complaint Chief Complaint  Patient presents with  . Ear Pain  . Tinnitus    HPI Joseph Strong is a 50 y.o. male.   Patient is a 50 year old male who presents for evaluation of the above issues.  He normally sees [facility] in [place] for his ongoing medical care.  He does warehouse work.  The first issue is bilateral ear ringing that started about a week ago.  He denies any trauma to the area.  He says that his workplace is not loud.  He has not done any concerts.  Itches began out of the blue.  No hearing issues.  No sore throat.  No neck pain.  The second issue is pain behind his right eye with associated right-sided headache.  No painful vision double vision or blurry vision.  No nausea vomiting or diarrhea.  No abdominal or urinary symptoms.  No trauma.  He has associated nasal congestion.  No history of seasonal allergies.  The third issue is burning on urination.  He has an enlarged prostate.  Being followed by urology.  He denies any hematuria or increased urinary frequency or urgency.  No urinary retention.  No abdominal pain.  No flank pain.  No red flag signs or symptoms elicited on history.      Past Medical History:  Diagnosis Date  . Burning with urination 01-10-16  . Constipation   . Dysrhythmia    "OCCASSIONAL SPEEDING UP OF HEART RATE" PT THINKING IT MAY BE DUE TO HERNIA  . Enlarged prostate   . Headache    MIGRAINES  . Inguinal hernia    left     Patient Active Problem List   Diagnosis Date Noted  . Chronic pain syndrome 03/04/2021  . Pharmacologic therapy 03/04/2021  . Disorder of skeletal system 03/04/2021  . Problems influencing health status 03/04/2021  . Pancreas divisum 10/09/2018  . Constipation 10/09/2018  . Ventral hernia without obstruction or gangrene 10/09/2018  . Prostate cancer screening 09/16/2017  . Abdominal pain,  periumbilical 12/20/2016  . Decreased frequency of bowel movements 01/10/2016  . Left paraspinal back pain 12/08/2015  . Dizziness, nonspecific 10/31/2015  . Abnormal TSH 10/31/2015  . Chronic pain of left groin 10/31/2015  . Abnormal EKG 10/31/2015  . Testicular pain, left 09/20/2015  . S/P unilateral inguinal hernia repair 09/20/2015    Past Surgical History:  Procedure Laterality Date  . HERNIA REPAIR Left 1998   AND UMBILICAL  . HERNIA REPAIR     50 years of age  . INGUINAL HERNIA REPAIR Right 01/11/2016   Procedure: HERNIA REPAIR INGUINAL ADULT;  Surgeon: Kieth Brightly, MD;  Location: [facility] ORS;  Service: General;  Laterality: Right;  . TONSILLECTOMY     childhood       Home Medications    Prior to Admission medications   Medication Sig Start Date End Date Taking? Authorizing Provider  fluticasone (FLONASE) 50 MCG/ACT nasal spray Place 2 sprays into both nostrils daily. 04/12/21  Yes Delton See, MD  Multiple Vitamin (MULTIVITAMIN) tablet Take 1 tablet by mouth daily.   Yes [provider]  acetaminophen (TYLENOL) 500 MG tablet Take 500 mg by mouth every 6 (six) hours as needed for mild pain.    [provider]  methocarbamol (ROBAXIN) 500 MG tablet Take 1 tablet (500 mg total) by mouth every 6 (six) hours  as needed. 04/07/21   Tommi RumpsSummers, Rhonda L, PA-C  naproxen (NAPROSYN) 500 MG tablet Take 1 tablet (500 mg total) by mouth 2 (two) times daily with a meal. 04/07/21   Bridget HartshornSummers, Rhonda L, PA-C  tadalafil (CIALIS) 5 MG tablet Take 1 tablet (5 mg total) by mouth daily as needed for erectile dysfunction. 10/26/20   Harle BattiestMcGowan, Shannon A, PA-C    Family History Family History  Problem Relation Age of Onset  . Hypertension Maternal Grandmother   . Prostate cancer Neg Hx   . Kidney cancer Neg Hx   . Bladder Cancer Neg Hx     Social History Social History   Tobacco Use  . Smoking status: Current Every Day Smoker    Types: Cigars, Cigarettes  .  Smokeless tobacco: Never Used  Vaping Use  . Vaping Use: Never used  Substance Use Topics  . Alcohol use: Yes    Alcohol/week: 0.0 standard drinks    Comment: occasional  . Drug use: No     Allergies   Patient has no known allergies.   Review of Systems Review of Systems  Constitutional: Negative for activity change, appetite change, chills, diaphoresis, fatigue and fever.  HENT: Positive for congestion, ear pain and tinnitus. Negative for ear discharge, postnasal drip, rhinorrhea, sinus pressure, sinus pain, sneezing and sore throat.   Eyes: Negative for pain and visual disturbance.  Respiratory: Negative for cough, chest tightness and shortness of breath.   Cardiovascular: Negative for chest pain and palpitations.  Gastrointestinal: Negative for abdominal pain, diarrhea, nausea and vomiting.  Genitourinary: Positive for dysuria. Negative for flank pain, frequency, genital sores, hematuria, penile discharge, penile pain, penile swelling, scrotal swelling, testicular pain and urgency.  Musculoskeletal: Negative for arthralgias, back pain, myalgias, neck pain and neck stiffness.  Skin: Negative for color change, pallor, rash and wound.  Neurological: Positive for headaches. Negative for dizziness, seizures, syncope, light-headedness and numbness.  All other systems reviewed and are negative.    Physical Exam Triage Vital Signs ED Triage Vitals  Enc Vitals Group     BP 04/12/21 1721 125/89     Pulse Rate 04/12/21 1721 94     Resp 04/12/21 1721 18     Temp 04/12/21 1721 98 F (36.7 C)     Temp Source 04/12/21 1721 Oral     SpO2 04/12/21 1721 98 %     Weight 04/12/21 1717 180 lb (81.6 kg)     Height 04/12/21 1717 6' (1.829 m)     Head Circumference --      Peak Flow --      Pain Score 04/12/21 1716 5     Pain Loc --      Pain Edu? --      Excl. in GC? --    No data found.  Updated Vital Signs BP 125/89   Pulse 94   Temp 98 F (36.7 C) (Oral)   Resp 18   Ht 6'  (1.829 m)   Wt 81.6 kg   SpO2 98%   BMI 24.41 kg/m   Visual Acuity Right Eye Distance:   Left Eye Distance:   Bilateral Distance:    Right Eye Near:   Left Eye Near:    Bilateral Near:     Physical Exam Vitals and nursing note reviewed.  Constitutional:      General: He is not in acute distress.    Appearance: Normal appearance. He is not ill-appearing, toxic-appearing or diaphoretic.  HENT:  Head: Normocephalic and atraumatic.     Right Ear: Tympanic membrane normal.     Left Ear: Tympanic membrane normal.     Nose: Congestion present. No rhinorrhea.     Mouth/Throat:     Mouth: Mucous membranes are moist.  Eyes:     General: Vision grossly intact. No scleral icterus.       Right eye: No discharge.        Left eye: No discharge.     Extraocular Movements: Extraocular movements intact.     Conjunctiva/sclera: Conjunctivae normal.     Pupils: Pupils are equal, round, and reactive to light. Pupils are equal.  Cardiovascular:     Rate and Rhythm: Normal rate and regular rhythm.     Pulses: Normal pulses.     Heart sounds: Normal heart sounds. No murmur heard. No friction rub. No gallop.   Pulmonary:     Effort: Pulmonary effort is normal.     Breath sounds: Normal breath sounds. No stridor. No wheezing, rhonchi or rales.  Abdominal:     Palpations: Abdomen is soft.     Tenderness: There is no abdominal tenderness. There is no right CVA tenderness, left CVA tenderness, guarding or rebound.  Musculoskeletal:     Cervical back: Normal range of motion and neck supple.  Skin:    General: Skin is warm and dry.     Capillary Refill: Capillary refill takes less than 2 seconds.  Neurological:     General: No focal deficit present.     Mental Status: He is alert and oriented to person, place, and time.     GCS: GCS eye subscore is 4. GCS verbal subscore is 5. GCS motor subscore is 6.     Cranial Nerves: Cranial nerves are intact.     Sensory: Sensation is intact.      Motor: Motor function is intact.     Coordination: Coordination is intact.     Gait: Gait is intact.     Deep Tendon Reflexes:     Reflex Scores:      Tricep reflexes are 2+ on the right side and 2+ on the left side.      Bicep reflexes are 2+ on the right side and 2+ on the left side.      Brachioradialis reflexes are 2+ on the right side and 2+ on the left side.      Patellar reflexes are 2+ on the right side and 2+ on the left side.      Achilles reflexes are 2+ on the right side and 2+ on the left side.     UC Treatments / Results  Labs (all labs ordered are listed, but only abnormal results are displayed) Labs Reviewed  URINALYSIS, COMPLETE (UACMP) WITH MICROSCOPIC - Abnormal; Notable for the following components:      Result Value   Bacteria, UA RARE (*)    All other components within normal limits    EKG   Radiology No results found.  Procedures Procedures (including critical care time)  Medications Ordered in UC Medications - No data to display  Initial Impression / Assessment and Plan / UC Course  I have reviewed the triage vital signs and the nursing notes.  Pertinent labs & imaging results that were available during my care of the patient were reviewed by me and considered in my medical decision making (see chart for details).  Clinical impression: 1.  Ringing in his ears for 1 week consistent with tinnitus.  2.  Pain behind his right eye with associated headaches and a reassuring examination. 3.  Burning on urination with a history of hypertrophy of the prostate and follow-up by urology.  Treatment plan: 1.  The findings and treatment plan were discussed in detail with the patient.  Patient was in agreement. 2.  Recommended getting a UA.  It did not show evidence of urinary tract infection.  Just rare bacteria which is probably a contaminant. 3.  Educational handouts provided. 4.  I have asked him to flush his system with plenty of water. 5.  Regarding  the discomfort behind his eye and his headache it may be related to the tinnitus but it could also be related to some nasal congestion.  We will do a trial of Flonase.  It was sent to his pharmacy. 6.  Over-the-counter meds as needed. 7.  If his symptoms persist he should see his primary care provider. 8.  If they worsen in any way then he should go to the emergency room. 9.  He was discharged in stable condition and will follow-up here as needed.    Final Clinical Impressions(s) / UC Diagnoses   Final diagnoses:  Tinnitus of both ears  Dysuria  Nasal congestion  Right-sided headache     Discharge Instructions     As we discussed, you have a diagnosis called tinnitus.  I gave you an educational handout on it.  Please read it and try to modify some of the modifiable reasons for you to have it. Also you are having right-sided headache with some discomfort behind her right eye.  Your neurological exam is within normal limits.  You do not have any vision changes. This may be entirely due to some fluid behind your ears given you all of the symptoms.  We will try a nasal steroid. I encourage you to get into see your primary care provider to discuss this further and see whether or not you need subspecialty referral or further testing. Your urine was negative.  You tell me that urology is told you have an enlarged prostate and you are having burning on urination.  Please drink plenty of fluids.  Lots of water.  If this persist please let your primary care provider know.  You may also want to follow-up with urology as well.  Nothing on the urinalysis to treat today.    ED Prescriptions    Medication Sig Dispense Auth. Provider   fluticasone (FLONASE) 50 MCG/ACT nasal spray Place 2 sprays into both nostrils daily. 15.8 mL Delton See, MD     PDMP not reviewed this encounter.   Delton See, MD 04/14/21 [phone]

## 2021-04-16 ENCOUNTER — Ambulatory Visit: Payer: 59 | Admitting: Pain Medicine

## 2021-04-25 ENCOUNTER — Ambulatory Visit: Payer: 59 | Admitting: Pain Medicine

## 2021-04-25 ENCOUNTER — Encounter: Payer: Self-pay | Admitting: Pain Medicine

## 2021-04-25 NOTE — Progress Notes (Deleted)
For the second time, the patient has not shown to his initial evaluation at our practice.

## 2021-05-19 ENCOUNTER — Other Ambulatory Visit: Payer: Self-pay

## 2021-05-19 ENCOUNTER — Encounter: Payer: Self-pay | Admitting: Emergency Medicine

## 2021-05-19 ENCOUNTER — Emergency Department
Admission: EM | Admit: 2021-05-19 | Discharge: 2021-05-20 | Disposition: A | Discharge status: Home / Self Care | Payer: 59 | Attending: Emergency Medicine | Admitting: Emergency Medicine

## 2021-05-19 DIAGNOSIS — F17210 Nicotine dependence, cigarettes, uncomplicated: Secondary | ICD-10-CM | POA: Diagnosis not present

## 2021-05-19 DIAGNOSIS — G8929 Other chronic pain: Secondary | ICD-10-CM

## 2021-05-19 DIAGNOSIS — R519 Headache, unspecified: Secondary | ICD-10-CM | POA: Diagnosis not present

## 2021-05-19 DIAGNOSIS — Z20822 Contact with and (suspected) exposure to covid-19: Secondary | ICD-10-CM | POA: Insufficient documentation

## 2021-05-19 DIAGNOSIS — M545 Low back pain, unspecified: Secondary | ICD-10-CM | POA: Insufficient documentation

## 2021-05-19 LAB — CBC WITH DIFFERENTIAL/PLATELET
Abs Immature Granulocytes: 0.02 10*3/uL (ref 0.00–0.07)
Basophils Absolute: 0 10*3/uL (ref 0.0–0.1)
Basophils Relative: 1 %
Eosinophils Absolute: 0.1 10*3/uL (ref 0.0–0.5)
Eosinophils Relative: 2 %
HCT: 47 % (ref 39.0–52.0)
Hemoglobin: 15.6 g/dL (ref 13.0–17.0)
Immature Granulocytes: 0 %
Lymphocytes Relative: 24 %
Lymphs Abs: 1.3 10*3/uL (ref 0.7–4.0)
MCH: 28.4 pg (ref 26.0–34.0)
MCHC: 33.2 g/dL (ref 30.0–36.0)
MCV: 85.6 fL (ref 80.0–100.0)
Monocytes Absolute: 0.5 10*3/uL (ref 0.1–1.0)
Monocytes Relative: 9 %
Neutro Abs: 3.4 10*3/uL (ref 1.7–7.7)
Neutrophils Relative %: 64 %
Platelets: 247 10*3/uL (ref 150–400)
RBC: 5.49 MIL/uL (ref 4.22–5.81)
RDW: 13.4 % (ref 11.5–15.5)
WBC: 5.4 10*3/uL (ref 4.0–10.5)
nRBC: 0 % (ref 0.0–0.2)

## 2021-05-19 LAB — URINALYSIS, COMPLETE (UACMP) WITH MICROSCOPIC
Bacteria, UA: NONE SEEN
Bilirubin Urine: NEGATIVE
Glucose, UA: NEGATIVE mg/dL
Hgb urine dipstick: NEGATIVE
Ketones, ur: NEGATIVE mg/dL
Leukocytes,Ua: NEGATIVE
Nitrite: NEGATIVE
Protein, ur: NEGATIVE mg/dL
Specific Gravity, Urine: 1.001 — ABNORMAL LOW (ref 1.005–1.030)
Squamous Epithelial / HPF: NONE SEEN (ref 0–5)
pH: 6 (ref 5.0–8.0)

## 2021-05-19 LAB — BASIC METABOLIC PANEL
Anion gap: 8 (ref 5–15)
BUN: 16 mg/dL (ref 6–20)
CO2: 20 mmol/L — ABNORMAL LOW (ref 22–32)
Calcium: 9 mg/dL (ref 8.9–10.3)
Chloride: 108 mmol/L (ref 98–111)
Creatinine, Ser: 1.16 mg/dL (ref 0.61–1.24)
GFR, Estimated: >60 mL/min (ref >60)
Glucose, Bld: 130 mg/dL — ABNORMAL HIGH (ref 70–99)
Potassium: 3.2 mmol/L — ABNORMAL LOW (ref 3.5–5.1)
Sodium: 136 mmol/L (ref 135–145)

## 2021-05-19 NOTE — ED Triage Notes (Signed)
Pt reports that at work 2 people had tested positive for COVID. He has been  having back pain, pain behind his eyes and joint pain for the last week.

## 2021-05-20 LAB — SARS CORONAVIRUS 2 (TAT 6-24 HRS): SARS Coronavirus 2: NEGATIVE

## 2021-05-20 MED ORDER — POTASSIUM CHLORIDE CRYS ER 20 MEQ PO TBCR
40.0000 meq | EXTENDED_RELEASE_TABLET | Freq: Once | ORAL | Status: AC
  Administered 2021-05-20: 40 meq via ORAL
  Filled 2021-05-20: qty 2

## 2021-05-20 MED ORDER — PROCHLORPERAZINE MALEATE 10 MG PO TABS: 10.0000 mg | ORAL_TABLET | Freq: Four times a day (QID) | ORAL | 0 refills | Status: DC | PRN

## 2021-05-20 MED ORDER — KETOROLAC TROMETHAMINE 30 MG/ML IJ SOLN
30.0000 mg | Freq: Once | INTRAMUSCULAR | Status: AC
  Administered 2021-05-20: 30 mg via INTRAMUSCULAR
  Filled 2021-05-20: qty 1

## 2021-05-20 NOTE — ED Provider Notes (Signed)
[facility] Emergency Department Provider Note   ____________________________________________   Event Date/Time   First MD Initiated Contact with Patient 05/19/21 2352     (approximate)  I have reviewed the triage vital signs and the nursing notes.   HISTORY  Chief Complaint Back Pain, Joint Pain, Headache, and Facial Pain    HPI Joseph Strong is a 50 y.o. male with past medical history of migraines and chronic pain syndrome who presents to the ED complaining of headache.  Patient reports that he has had gradually worsening headache over the past couple of days.  He describes it as a diffuse throbbing over much of the front of his head.  He denies any fevers, neck stiffness, vision changes, numbness, or weakness.  He states he has had similar headaches in the past but is not sure what causes them.  He has not taken anything for his symptoms at home.  Headache has been associated with pain in his lower back.  He states he is often dealt with pain in his lower back in the past but it seems to have flared up again today.  He denies any recent trauma to his back.  He has not had any numbness or weakness in his extremities, denies any problems go to the bathroom, and denies any saddle anesthesia.  He does state that a couple of his coworkers recently tested positive for COVID-19, but he denies any cough, chest pain, shortness of breath, vomiting, or diarrhea.        Past Medical History:  Diagnosis Date   Burning with urination 01-10-16   Constipation    Dysrhythmia    "OCCASSIONAL SPEEDING UP OF HEART RATE" PT THINKING IT MAY BE DUE TO HERNIA   Enlarged prostate    Headache    MIGRAINES   Inguinal hernia    left     Patient Active Problem List   Diagnosis Date Noted   Chronic pain syndrome 03/04/2021   Pharmacologic therapy 03/04/2021   Disorder of skeletal system 03/04/2021   Problems influencing health status 03/04/2021   Pancreas divisum 10/09/2018    Constipation 10/09/2018   Ventral hernia without obstruction or gangrene 10/09/2018   Prostate cancer screening 09/16/2017   Abdominal pain, periumbilical 12/20/2016   Decreased frequency of bowel movements 01/10/2016   Paraspinal back pain (Left) 12/08/2015   Dizziness, nonspecific 10/31/2015   Abnormal TSH 10/31/2015   Chronic groin pain (Left) 10/31/2015   Abnormal EKG 10/31/2015   Testicular pain (Left) 09/20/2015   S/P unilateral inguinal hernia repair 09/20/2015    Past Surgical History:  Procedure Laterality Date   HERNIA REPAIR Left 1998   AND UMBILICAL   HERNIA REPAIR     50 years of age   INGUINAL HERNIA REPAIR Right 01/11/2016   Procedure: HERNIA REPAIR INGUINAL ADULT;  Surgeon: Kieth Brightly, MD;  Location: [facility] ORS;  Service: General;  Laterality: Right;   TONSILLECTOMY     childhood    Prior to Admission medications   Medication Sig Start Date End Date Taking? Authorizing Provider  prochlorperazine (COMPAZINE) 10 MG tablet Take 1 tablet (10 mg total) by mouth every 6 (six) hours as needed for nausea or vomiting (or headache). 05/20/21  Yes Chesley Noon, MD  acetaminophen (TYLENOL) 500 MG tablet Take 500 mg by mouth every 6 (six) hours as needed for mild pain.    [provider]  fluticasone (FLONASE) 50 MCG/ACT nasal spray Place 2 sprays into both nostrils daily. 04/12/21  Delton See, MD  methocarbamol (ROBAXIN) 500 MG tablet Take 1 tablet (500 mg total) by mouth every 6 (six) hours as needed. 04/07/21   Tommi Rumps, PA-C  Multiple Vitamin (MULTIVITAMIN) tablet Take 1 tablet by mouth daily.    [provider]  naproxen (NAPROSYN) 500 MG tablet Take 1 tablet (500 mg total) by mouth 2 (two) times daily with a meal. 04/07/21   Bridget Hartshorn L, PA-C  tadalafil (CIALIS) 5 MG tablet Take 1 tablet (5 mg total) by mouth daily as needed for erectile dysfunction. 10/26/20   Michiel Cowboy A, PA-C    Allergies Patient has no known  allergies.  Family History  Problem Relation Age of Onset   Hypertension Maternal Grandmother    Prostate cancer Neg Hx    Kidney cancer Neg Hx    Bladder Cancer Neg Hx     Social History Social History   Tobacco Use   Smoking status: Every Day    Pack years: 0.00    Types: Cigars, Cigarettes   Smokeless tobacco: Never  Vaping Use   Vaping Use: Never used  Substance Use Topics   Alcohol use: Yes    Alcohol/week: 0.0 standard drinks    Comment: occasional   Drug use: No    Review of Systems  Constitutional: No fever/chills Eyes: No visual changes. ENT: No sore throat. Cardiovascular: Denies chest pain. Respiratory: Denies shortness of breath. Gastrointestinal: No abdominal pain.  No nausea, no vomiting.  No diarrhea.  No constipation. Genitourinary: Negative for dysuria. Musculoskeletal: Positive for back pain. Skin: Negative for rash. Neurological: Positive for headaches, negative for focal weakness or numbness.  ____________________________________________   PHYSICAL EXAM:  VITAL SIGNS: ED Triage Vitals  Enc Vitals Group     BP 05/19/21 2018 136/90     Pulse Rate 05/19/21 2018 80     Resp 05/19/21 2018 20     Temp 05/19/21 2018 98.7 F (37.1 C)     Temp Source 05/19/21 2018 Oral     SpO2 05/19/21 2018 99 %     Weight 05/19/21 2019 180 lb (81.6 kg)     Height 05/19/21 2019 6' (1.829 m)     Head Circumference --      Peak Flow --      Pain Score 05/19/21 2019 8     Pain Loc --      Pain Edu? --      Excl. in GC? --     Constitutional: Alert and oriented. Eyes: Conjunctivae are normal.  Pupils equal, round, and reactive to light bilaterally. Head: Atraumatic. Nose: No congestion/rhinnorhea. Mouth/Throat: Mucous membranes are moist. Neck: Normal ROM Cardiovascular: Normal rate, regular rhythm. Grossly normal heart sounds.  2+ radial pulses bilaterally. Respiratory: Normal respiratory effort.  No retractions. Lungs CTAB. Gastrointestinal: Soft and  nontender. No distention. Genitourinary: deferred Musculoskeletal: No lower extremity tenderness nor edema. Neurologic:  Normal speech and language. No gross focal neurologic deficits are appreciated. Skin:  Skin is warm, dry and intact. No rash noted. Psychiatric: Mood and affect are normal. Speech and behavior are normal.  ____________________________________________   LABS (all labs ordered are listed, but only abnormal results are displayed)  Labs Reviewed  BASIC METABOLIC PANEL - Abnormal; Notable for the following components:      Result Value   Potassium 3.2 (*)    CO2 20 (*)    Glucose, Bld 130 (*)    All other components within normal limits  URINALYSIS, COMPLETE (UACMP) WITH MICROSCOPIC -  Abnormal; Notable for the following components:   Color, Urine COLORLESS (*)    APPearance CLEAR (*)    Specific Gravity, Urine 1.001 (*)    All other components within normal limits  SARS CORONAVIRUS 2 (TAT 6-24 HRS)  CBC WITH DIFFERENTIAL/PLATELET    PROCEDURES  Procedure(s) performed (including Critical Care):  Procedures   ____________________________________________   INITIAL IMPRESSION / ASSESSMENT AND PLAN / ED COURSE      50 year old male with past medical history of migraines and chronic pain syndrome who presents to the ED with gradually worsening headache over the past 2 days associated with increase in his chronic low back pain.  Patient has no fevers or neck stiffness to suggest meningitis, low suspicion for [facility] given gradual onset of headache with similar episodes of headache in the past.  Patient has no focal neurologic deficits on exam and I do not feel CT imaging is indicated at this time.  Labs are unremarkable, we will add on COVID testing given patient's recent exposure.  We will treat symptomatically with IM Toradol, no concerning features noted to patient's chronic back pain.  He is appropriate for discharge home with PCP follow-up and we will prescribe  Compazine.  He was counseled to return to the ED for new worsening symptoms, patient agrees with plan.      ____________________________________________   FINAL CLINICAL IMPRESSION(S) / ED DIAGNOSES  Final diagnoses:  Acute nonintractable headache, unspecified headache type  Chronic midline low back pain without sciatica     ED Discharge Orders          Ordered    prochlorperazine (COMPAZINE) 10 MG tablet  Every 6 hours PRN        05/20/21 0034             Note:  This document was prepared using Dragon voice recognition software and may include unintentional dictation errors.    Chesley Noon, MD 05/20/21 [phone]

## 2021-05-20 NOTE — ED Notes (Signed)
E signature pad not functioning. Pt received hard copy of dc instructions and verbalized understanding.

## 2021-05-21 ENCOUNTER — Telehealth: Payer: Self-pay

## 2021-05-21 NOTE — Telephone Encounter (Signed)
Pt. Given COVID 19 results, verbalizes understanding. 

## 2021-06-12 ENCOUNTER — Other Ambulatory Visit: Payer: Self-pay

## 2021-06-12 ENCOUNTER — Encounter: Payer: Self-pay | Admitting: Family Medicine

## 2021-06-12 ENCOUNTER — Ambulatory Visit (INDEPENDENT_AMBULATORY_CARE_PROVIDER_SITE_OTHER): Payer: 59 | Admitting: Family Medicine

## 2021-06-12 VITALS — BP 122/80 | HR 100 | Temp 98.2°F | Resp 16 | Ht 72.0 in | Wt 178.8 lb

## 2021-06-12 DIAGNOSIS — R519 Headache, unspecified: Secondary | ICD-10-CM

## 2021-06-12 DIAGNOSIS — J324 Chronic pansinusitis: Secondary | ICD-10-CM | POA: Diagnosis not present

## 2021-06-12 DIAGNOSIS — G894 Chronic pain syndrome: Secondary | ICD-10-CM | POA: Diagnosis not present

## 2021-06-12 MED ORDER — DOXYCYCLINE HYCLATE 100 MG PO TABS: 100.0000 mg | ORAL_TABLET | Freq: Two times a day (BID) | ORAL | 0 refills | Status: DC

## 2021-06-12 MED ORDER — IPRATROPIUM BROMIDE 0.03 % NA SOLN: 2.0000 | Freq: Two times a day (BID) | NASAL | 12 refills | Status: DC

## 2021-06-12 MED ORDER — LEVOCETIRIZINE DIHYDROCHLORIDE 5 MG PO TABS: 5.0000 mg | ORAL_TABLET | Freq: Every evening | ORAL | 1 refills | Status: DC

## 2021-06-12 MED ORDER — DOXYCYCLINE HYCLATE 100 MG PO TABS: 100.0000 mg | ORAL_TABLET | Freq: Two times a day (BID) | ORAL | 0 refills | Status: AC

## 2021-06-12 NOTE — Patient Instructions (Signed)
Treat with every other day nasal steroid sprays, use saline sprays daily to moisturize and start a antihistamine.   If you want a referral to an allergist of a ENT let me know  Allergic Rhinitis, Adult Allergic rhinitis is a reaction to allergens. Allergens are things that can cause an allergic reaction. This condition affects the lining inside the nose (mucous membrane). There are two types of allergic rhinitis: Seasonal. This type is also called hay fever. It happens only during some times of the year. Perennial. This type can happen at any time of the year. This condition cannot be spread from person to person (is not contagious). It can be mild, worse, or very bad. It can develop at any age and may beoutgrown. What are the causes? This condition may be caused by: Pollen from grasses, trees, and weeds. Dust mites. Smoke. Mold. Car fumes. The pee (urine), spit, or dander of pets. Dander is dead skin cells from a pet. What increases the risk? You are more likely to develop this condition if: You have allergies in your family. You have problems like allergies in your family. You may have: Swelling of parts of your eyes and eyelids. Asthma. This affects how you breathe. Long-term redness and swelling on your skin. Food allergies. What are the signs or symptoms? The main symptom of this condition is a runny or stuffy nose (nasal congestion). Other symptoms may include: Sneezing or coughing. Itching and tearing of your eyes. Mucus that drips down the back of your throat (postnasal drip). Trouble sleeping. Feeling tired. Headache. Sore throat. How is this treated? There is no cure for this condition. You should avoid things that you are allergic to. Treatment can help to relieve symptoms. This may include: Medicines that block allergy symptoms, such as corticosteroids or antihistamines. These may be given as a shot, nasal spray, or pill. Avoiding things you are allergic  to. Medicines that give you bits of what you are allergic to over time. This is called immunotherapy. It is done if other treatments do not help. You may get: Shots. Medicine under your tongue. Stronger medicines, if other treatments do not help. Follow these instructions at home: Avoiding allergens Find out what things you are allergic to and avoid them. To do this, try these things: If you get allergies any time of year: Replace carpet with wood, tile, or vinyl flooring. Carpet can trap pet dander and dust. Do not smoke. Do not allow smoking in your home. Change your heating and air conditioning filters at least once a month. If you get allergies only some times of the year: Keep windows closed when you can. Plan things to do outside when pollen counts are lowest. Check pollen counts before you plan things to do outside. When you come indoors, change your clothes and shower before you sit on furniture or bedding. If you are allergic to a pet: Keep the pet out of your bedroom. Vacuum, sweep, and dust often.  General instructions Take over-the-counter and prescription medicines only as told by your doctor. Drink enough fluid to keep your pee (urine) pale yellow. Keep all follow-up visits as told by your doctor. This is important. Where to find more information American Academy of Allergy, Asthma & Immunology: www.aaaai.org Contact a doctor if: You have a fever. You get a cough that does not go away. You make whistling sounds when you breathe (wheeze). Your symptoms slow you down. Your symptoms stop you from doing your normal things each day. Get  help right away if: You are short of breath. This symptom may be an emergency. Do not wait to see if the symptom will go away. Get medical help right away. Call your local emergency services (911 in the [place] Do not drive yourself to the hospital. Summary Allergic rhinitis may be treated by taking medicines and avoiding things you are  allergic to. If you have allergies only some of the year, keep windows closed when you can at those times. Contact your doctor if you get a fever or a cough that does not go away. This information is not intended to replace advice given to you by your health care provider. Make sure you discuss any questions you have with your healthcare provider. Document Revised: 01/03/2020 Document Reviewed: 11/09/2019 Elsevier Patient Education  2022 [employer].

## 2021-06-12 NOTE — Progress Notes (Signed)
d   Patient ID: Joseph Strong, male    DOB: 05-18-1971, 50 y.o.   MRN: [id number]  PCP: Danelle Berry, PA-C  Chief Complaint  Patient presents with   Headache   Back Pain    Low back    Subjective:   Joseph Strong is a 49 y.o. male, presents to clinic with CC of the following:  HPI  Pt presents with months of sinus sx and HA's multiple COVID tests negative, he was using flonase and previously went to an ER for a nose bleed- they told him to never use that - he was treated with augmentin and has some improvement. HA are pressure in various locations, sometimes he has congestion and pressure in ears, around eyes or cheeks Currently not on any nasal sprays or allergy meds  He was given compazine, naproxen, fioricet and robaxin in the ED for HA's N/V, neck and back pain. Recently no vomiting He reports and complains of pain to leg, and back and some to arm - various locations that come and go     Patient Active Problem List   Diagnosis Date Noted   Chronic pain syndrome 03/04/2021   Pharmacologic therapy 03/04/2021   Disorder of skeletal system 03/04/2021   Problems influencing health status 03/04/2021   Pancreas divisum 10/09/2018   Constipation 10/09/2018   Ventral hernia without obstruction or gangrene 10/09/2018   Prostate cancer screening 09/16/2017   Abdominal pain, periumbilical 12/20/2016   Decreased frequency of bowel movements 01/10/2016   Paraspinal back pain (Left) 12/08/2015   Dizziness, nonspecific 10/31/2015   Abnormal TSH 10/31/2015   Chronic groin pain (Left) 10/31/2015   Abnormal EKG 10/31/2015   Testicular pain (Left) 09/20/2015   S/P unilateral inguinal hernia repair 09/20/2015      Current Outpatient Medications:    acetaminophen (TYLENOL) 500 MG tablet, Take 500 mg by mouth every 6 (six) hours as needed for mild pain., Disp: , Rfl:    Butalbital-APAP-Caffeine 50-300-40 MG CAPS, Take by mouth., Disp: , Rfl:    fluticasone (FLONASE) 50 MCG/ACT nasal  spray, Place 2 sprays into both nostrils daily., Disp: 15.8 mL, Rfl: 0   methocarbamol (ROBAXIN) 500 MG tablet, Take 1 tablet (500 mg total) by mouth every 6 (six) hours as needed., Disp: 15 tablet, Rfl: 0   Multiple Vitamin (MULTIVITAMIN) tablet, Take 1 tablet by mouth daily., Disp: , Rfl:    naproxen (NAPROSYN) 500 MG tablet, Take 1 tablet (500 mg total) by mouth 2 (two) times daily with a meal., Disp: 20 tablet, Rfl: 0   prochlorperazine (COMPAZINE) 10 MG tablet, Take 1 tablet (10 mg total) by mouth every 6 (six) hours as needed for nausea or vomiting (or headache)., Disp: 12 tablet, Rfl: 0   tadalafil (CIALIS) 5 MG tablet, Take 1 tablet (5 mg total) by mouth daily as needed for erectile dysfunction., Disp: 90 tablet, Rfl: 3   No Known Allergies   Social History   Tobacco Use   Smoking status: Every Day    Types: Cigars, Cigarettes   Smokeless tobacco: Never  Vaping Use   Vaping Use: Never used  Substance Use Topics   Alcohol use: Yes    Alcohol/week: 0.0 standard drinks    Comment: occasional   Drug use: No      Chart Review Today: I personally reviewed active problem list, medication list, allergies, family history, social history, health maintenance, notes from last encounter, lab results, imaging with the patient/caregiver today.   Review of Systems  Constitutional: Negative.  Negative for activity change, appetite change, chills, diaphoresis, fatigue, fever and unexpected weight change.  HENT: Negative.    Eyes: Negative.  Negative for pain, discharge, redness and itching.  Respiratory: Negative.  Negative for cough, chest tightness and shortness of breath.   Cardiovascular: Negative.  Negative for chest pain, palpitations and leg swelling.  Gastrointestinal: Negative.  Negative for abdominal pain, constipation, diarrhea, nausea and vomiting.  Endocrine: Negative.   Genitourinary: Negative.   Musculoskeletal:  Positive for myalgias. Negative for neck stiffness.  Skin:  Negative.  Negative for color change, pallor, rash and wound.  Allergic/Immunologic: Negative.   Neurological:  Positive for headaches. Negative for dizziness, tremors, seizures, syncope, facial asymmetry, weakness, light-headedness and numbness.  Hematological: Negative.   Psychiatric/Behavioral: Negative.    All other systems reviewed and are negative.     Objective:   Vitals:   06/12/21 1547  BP: 122/80  Pulse: 100  Resp: 16  Temp: 98.2 F (36.8 C)  SpO2: 98%  Weight: 178 lb 12.8 oz (81.1 kg)  Height: 6' (1.829 m)    Body mass index is 24.41 kg/m.  Physical Exam Vitals and nursing note reviewed.  Constitutional:      General: He is not in acute distress.    Appearance: Normal appearance. He is well-developed and normal weight. He is not ill-appearing, toxic-appearing or diaphoretic.  HENT:     Head: Normocephalic and atraumatic.     Jaw: No trismus.     Right Ear: Tympanic membrane, ear canal and external ear normal. There is no impacted cerumen.     Left Ear: Tympanic membrane, ear canal and external ear normal. There is no impacted cerumen.     Nose: Mucosal edema, congestion and rhinorrhea present.     Right Turbinates: Enlarged and swollen.     Left Turbinates: Enlarged and swollen.     Right Sinus: Maxillary sinus tenderness present. No frontal sinus tenderness.     Left Sinus: Maxillary sinus tenderness present. No frontal sinus tenderness.     Mouth/Throat:     Mouth: Mucous membranes are moist. Mucous membranes are not pale, not dry and not cyanotic.     Pharynx: Oropharynx is clear. Uvula midline. No oropharyngeal exudate, posterior oropharyngeal erythema or uvula swelling.     Tonsils: No tonsillar exudate or tonsillar abscesses.  Eyes:     General: Lids are normal. No visual field deficit.       Right eye: No discharge.        Left eye: No discharge.     Conjunctiva/sclera: Conjunctivae normal.     Pupils: Pupils are equal, round, and reactive to light.   Neck:     Thyroid: No thyroid mass or thyromegaly.     Trachea: Trachea and phonation normal. No tracheal deviation.  Cardiovascular:     Rate and Rhythm: Normal rate and regular rhythm.     Pulses:          Radial pulses are 2+ on the right side and 2+ on the left side.     Heart sounds: Normal heart sounds. No murmur heard.   No friction rub. No gallop.  Pulmonary:     Effort: Pulmonary effort is normal. No tachypnea, accessory muscle usage or respiratory distress.     Breath sounds: Normal breath sounds. No stridor. No decreased breath sounds, wheezing, rhonchi or rales.  Abdominal:     General: Bowel sounds are normal. There is no distension.     Palpations:  Abdomen is soft.     Tenderness: There is no abdominal tenderness.  Musculoskeletal:        General: Normal range of motion.     Cervical back: Normal range of motion and neck supple. No edema, erythema, rigidity or torticollis. No pain with movement, spinous process tenderness or muscular tenderness. Normal range of motion.  Lymphadenopathy:     Head:     Right side of head: No submental, submandibular, tonsillar, preauricular or posterior auricular adenopathy.     Left side of head: No submental, submandibular, tonsillar, preauricular or posterior auricular adenopathy.     Cervical: No cervical adenopathy.     Right cervical: No superficial, deep or posterior cervical adenopathy.    Left cervical: No superficial, deep or posterior cervical adenopathy.  Skin:    General: Skin is warm and dry.     Capillary Refill: Capillary refill takes less than 2 seconds.     Coloration: Skin is not pale.     Findings: No rash.     Nails: There is no clubbing.  Neurological:     Mental Status: He is alert and oriented to person, place, and time.     Cranial Nerves: No cranial nerve deficit, dysarthria or facial asymmetry.     Sensory: Sensation is intact. No sensory deficit.     Motor: Motor function is intact. No weakness, tremor or  abnormal muscle tone.     Coordination: Coordination is intact. Romberg sign negative. Coordination normal.     Gait: Gait is intact. Gait normal.  Psychiatric:        Attention and Perception: He is inattentive.        Mood and Affect: Mood and affect normal.        Speech: Speech is rapid and pressured and tangential.        Behavior: Behavior is hyperactive. Behavior is cooperative.     Results for orders placed or performed during the hospital encounter of 05/19/21  SARS CORONAVIRUS 2 (TAT 6-24 HRS) Nasopharyngeal Nasopharyngeal Swab   Specimen: Nasopharyngeal Swab  Result Value Ref Range   SARS Coronavirus 2 NEGATIVE NEGATIVE  CBC with Differential/Platelet  Result Value Ref Range   WBC 5.4 4.0 - 10.5 K/uL   RBC 5.49 4.22 - 5.81 MIL/uL   Hemoglobin 15.6 13.0 - 17.0 g/dL   HCT 74.2 59.5 - 63.8 %   MCV 85.6 80.0 - 100.0 fL   MCH 28.4 26.0 - 34.0 pg   MCHC 33.2 30.0 - 36.0 g/dL   RDW 75.6 43.3 - 29.5 %   Platelets 247 150 - 400 K/uL   nRBC 0.0 0.0 - 0.2 %   Neutrophils Relative % 64 %   Neutro Abs 3.4 1.7 - 7.7 K/uL   Lymphocytes Relative 24 %   Lymphs Abs 1.3 0.7 - 4.0 K/uL   Monocytes Relative 9 %   Monocytes Absolute 0.5 0.1 - 1.0 K/uL   Eosinophils Relative 2 %   Eosinophils Absolute 0.1 0.0 - 0.5 K/uL   Basophils Relative 1 %   Basophils Absolute 0.0 0.0 - 0.1 K/uL   Immature Granulocytes 0 %   Abs Immature Granulocytes 0.02 0.00 - 0.07 K/uL  Basic metabolic panel  Result Value Ref Range   Sodium 136 135 - 145 mmol/L   Potassium 3.2 (L) 3.5 - 5.1 mmol/L   Chloride 108 98 - 111 mmol/L   CO2 20 (L) 22 - 32 mmol/L   Glucose, Bld 130 (H) 70 - 99  mg/dL   BUN 16 6 - 20 mg/dL   Creatinine, Ser 4.09 0.61 - 1.24 mg/dL   Calcium 9.0 8.9 - 81.1 mg/dL   GFR, Estimated >91 >47 mL/min   Anion gap 8 5 - 15  Urinalysis, Complete w Microscopic  Result Value Ref Range   Color, Urine COLORLESS (A) YELLOW   APPearance CLEAR (A) CLEAR   Specific Gravity, Urine 1.001 (L) 1.005  - 1.030   pH 6.0 5.0 - 8.0   Glucose, UA NEGATIVE NEGATIVE mg/dL   Hgb urine dipstick NEGATIVE NEGATIVE   Bilirubin Urine NEGATIVE NEGATIVE   Ketones, ur NEGATIVE NEGATIVE mg/dL   Protein, ur NEGATIVE NEGATIVE mg/dL   Nitrite NEGATIVE NEGATIVE   Leukocytes,Ua NEGATIVE NEGATIVE   RBC / HPF 0-5 0 - 5 RBC/hpf   WBC, UA 0-5 0 - 5 WBC/hpf   Bacteria, UA NONE SEEN NONE SEEN   Squamous Epithelial / LPF NONE SEEN 0 - 5       Assessment & Plan:     ICD-10-CM   1. Pansinusitis, unspecified chronicity  J32.4 Ambulatory referral to ENT   very edematous and erythematous nasal mucosa throughout - start antihistamines, try different nasal spray, saline spray, tx with doxy, f/up ENT    2. Nonintractable headache, unspecified chronicity pattern, unspecified headache type  R51.9 Butalbital-APAP-Caffeine 50-300-40 MG CAPS   suspect current HA may be secondary to acute on chronic rhinosinusitis - fioricet not prescribed by me    3. Chronic pain syndrome  G89.4    multiple other various pain complaints in different locations in body with normal gait, movement, coordination - sees pain management     Meds ordered this encounter  Medications    levocetirizine (XYZAL) 5 MG tablet    Sig: Take 1 tablet (5 mg total) by mouth every evening.    Dispense:  90 tablet    Refill:  1    Order Specific Question:   Supervising Provider    Answer:   Alba Cory [[id number]]   : ipratropium (ATROVENT) 0.03 % nasal spray    Sig: Place 2 sprays into both nostrils every 12 (twelve) hours.    Dispense:  30 mL    Refill:  12    Order Specific Question:   Supervising Provider    Answer:   Alba Cory [[id number]]    doxycycline (VIBRA-TABS) 100 MG tablet    Sig: Take 1 tablet (100 mg total) by mouth 2 (two) times daily for 7 days.    Dispense:  14 tablet    Refill:  0    Order Specific Question:   Supervising Provider    Answer:   Alba Cory [[id number]]   Reviewed management of allergies, rhinosinusitis and sinus  pressure and HA's with pt and info put on handout F/up ENT       Danelle Berry, PA-C 06/12/21 3:55 PM

## 2021-08-23 ENCOUNTER — Ambulatory Visit: Payer: 59 | Admitting: Family Medicine

## 2021-09-20 ENCOUNTER — Ambulatory Visit: Payer: 59 | Admitting: Family Medicine

## 2021-10-04 ENCOUNTER — Encounter: Payer: Self-pay | Admitting: Unknown Physician Specialty

## 2021-10-04 ENCOUNTER — Other Ambulatory Visit: Payer: Self-pay

## 2021-10-04 ENCOUNTER — Ambulatory Visit (INDEPENDENT_AMBULATORY_CARE_PROVIDER_SITE_OTHER): Payer: 59 | Admitting: Unknown Physician Specialty

## 2021-10-04 ENCOUNTER — Other Ambulatory Visit (HOSPITAL_COMMUNITY)
Admission: RE | Admit: 2021-10-04 | Discharge: 2021-10-04 | Disposition: A | Discharge status: Home / Self Care | Payer: 59 | Source: Ambulatory Visit | Attending: Family Medicine | Admitting: Family Medicine

## 2021-10-04 VITALS — BP 132/82 | HR 97 | Temp 98.1°F | Resp 16 | Ht 72.0 in | Wt 175.6 lb

## 2021-10-04 DIAGNOSIS — Z711 Person with feared health complaint in whom no diagnosis is made: Secondary | ICD-10-CM

## 2021-10-04 DIAGNOSIS — Z23 Encounter for immunization: Secondary | ICD-10-CM | POA: Diagnosis not present

## 2021-10-04 NOTE — Progress Notes (Signed)
BP 132/82   Pulse 97   Temp 98.1 F (36.7 C) (Oral)   Resp 16   Ht 6' (1.829 m)   Wt 175 lb 9.6 oz (79.7 kg)   SpO2 99%   BMI 23.82 kg/m    Subjective:    Patient ID: Joseph Strong, male    DOB: 11-13-1971, 50 y.o.   MRN: [id number]  HPI: Sorin Frimpong is a 50 y.o. male  Chief Complaint  Patient presents with   Follow-up   Pt is here for a 6 month f/u.  He does see a Insurance underwriter for BPH and seems like he was on Proscar and Flomax.  Now taking Cialis for his BPH.  He was struggling with sinusitis but that seems to be better along with the chronic muscular pain.  Left groin pain seems to be somewhat chronic with complete work-up.  Labs done 6 months ago.  Working on getting a colonoscopy.    Concerned today for STD testing and would like to get tested.  He denies symptoms   Relevant past medical, surgical, family and social history reviewed and updated as indicated. Interim medical history since our last visit reviewed. Allergies and medications reviewed and updated.  Review of Systems  All other systems reviewed and are negative.  Per HPI unless specifically indicated above     Objective:    BP 132/82   Pulse 97   Temp 98.1 F (36.7 C) (Oral)   Resp 16   Ht 6' (1.829 m)   Wt 175 lb 9.6 oz (79.7 kg)   SpO2 99%   BMI 23.82 kg/m   Wt Readings from Last 3 Encounters:  10/04/21 175 lb 9.6 oz (79.7 kg)  06/12/21 178 lb 12.8 oz (81.1 kg)  05/19/21 180 lb (81.6 kg)    Physical Exam Constitutional:      General: He is not in acute distress.    Appearance: Normal appearance. He is well-developed.  HENT:     Head: Normocephalic and atraumatic.  Eyes:     General: Lids are normal. No scleral icterus.       Right eye: No discharge.        Left eye: No discharge.     Conjunctiva/sclera: Conjunctivae normal.  Cardiovascular:     Rate and Rhythm: Normal rate.  Pulmonary:     Effort: Pulmonary effort is normal.  Abdominal:     Palpations: There is no hepatomegaly or  splenomegaly.  Musculoskeletal:        General: Normal range of motion.  Skin:    Coloration: Skin is not pale.     Findings: No rash.  Neurological:     Mental Status: He is alert and oriented to person, place, and time.  Psychiatric:        Behavior: Behavior normal.        Thought Content: Thought content normal.        Judgment: Judgment normal.     Assessment & Plan:   Problem List Items Addressed This Visit   None Visit Diagnoses     Need for influenza vaccination    -  Primary   Relevant Orders   Flu Vaccine QUAD 6+ mos PF IM (Fluarix Quad PF) (Completed)   Concern about STD in male without diagnosis       Pt would like to have a STD check today and seems to be sole concern.  Physical 6 months ago.  Will check GC/Chlamydia, HIV. RPR   Relevant Orders  RPR (Completed)   HIV Antibody (routine testing w rflx)   Urine cytology ancillary only (Completed)        Follow up plan: Return in about 6 months (around 04/03/2022). For PE

## 2021-10-05 LAB — RPR: RPR Ser Ql: NONREACTIVE

## 2021-10-05 LAB — HIV ANTIBODY (ROUTINE TESTING W REFLEX): HIV 1&2 Ab, 4th Generation: NONREACTIVE

## 2021-10-08 LAB — URINE CYTOLOGY ANCILLARY ONLY
Chlamydia: NEGATIVE
Comment: NEGATIVE
Comment: NORMAL
Neisseria Gonorrhea: NEGATIVE

## 2021-12-07 ENCOUNTER — Other Ambulatory Visit: Payer: Self-pay | Admitting: Urology

## 2021-12-26 ENCOUNTER — Telehealth: Payer: Self-pay | Admitting: Family Medicine

## 2021-12-26 ENCOUNTER — Other Ambulatory Visit: Payer: Self-pay

## 2021-12-26 DIAGNOSIS — N401 Enlarged prostate with lower urinary tract symptoms: Secondary | ICD-10-CM

## 2021-12-26 DIAGNOSIS — N138 Other obstructive and reflux uropathy: Secondary | ICD-10-CM

## 2021-12-26 NOTE — Telephone Encounter (Signed)
Patient would like a call regarding lab results from 10/04/2022, patient never received a call, please call after 4pm or send a My Chart message.

## 2021-12-26 NOTE — Telephone Encounter (Signed)
Sent pt a mychart message. 

## 2022-01-01 ENCOUNTER — Other Ambulatory Visit: Payer: 59

## 2022-01-01 ENCOUNTER — Other Ambulatory Visit: Payer: Self-pay

## 2022-01-01 ENCOUNTER — Encounter: Payer: Self-pay | Admitting: Urology

## 2022-01-01 DIAGNOSIS — N138 Other obstructive and reflux uropathy: Secondary | ICD-10-CM

## 2022-01-02 LAB — PSA: Prostate Specific Ag, Serum: 0.8 ng/mL (ref 0.0–4.0)

## 2022-01-02 NOTE — Progress Notes (Signed)
12/16/2018  3:25 PM   Joseph Strong October 23, 1971 [id number]  Referring provider: [facility] [address] [place],  [place] [address]  Chief Complaint  Patient presents with   Benign Prostatic Hypertrophy   Urological history: 1. BPH with LU TS - PSA 0.8 in 12/2021 - I PSS 7/1 - PVR 9 mL - cysto in 2018 NED - managed with tadalafil 5 mg daily  2. Chronic pelvic pain/dysuria - scrotal US x 2 - NED - CT scans x 2 - NED - MRI NED - still waiting on colonoscopy  3. ED  HPI: Joseph Strong is a 51 y.o. male with BPH with LU TS, chronic pain and dysuria who presents for a IPSS, PSA and exam.  He still has pressure in the perineum from time to time.  Patient denies any modifying or aggravating factors.  Patient denies any gross hematuria, dysuria or suprapubic/flank pain.  Patient denies any fevers, chills, nausea or vomiting.     IPSS     Row Name 01/03/22 1400         International Prostate Symptom Score   How often have you had the sensation of not emptying your bladder? Not at All     How often have you had to urinate less than every two hours? Less than 1 in 5 times     How often have you found you stopped and started again several times when you urinated? Less than 1 in 5 times     How often have you found it difficult to postpone urination? Less than 1 in 5 times     How often have you had a weak urinary stream? Less than 1 in 5 times     How often have you had to strain to start urination? Less than 1 in 5 times     How many times did you typically get up at night to urinate? 2 Times     Total IPSS Score 7       Quality of Life due to urinary symptoms   If you were to spend the rest of your life with your urinary condition just the way it is now how would you feel about that? Pleased               Score:  1-7 Mild 8-19 Moderate 20-35 Severe   PMH: Past Medical History:  Diagnosis Date   Burning with urination  01-10-16   Constipation    Dysrhythmia    "OCCASSIONAL SPEEDING UP OF HEART RATE" PT THINKING IT MAY BE DUE TO HERNIA   Enlarged prostate    Headache    MIGRAINES   Inguinal hernia    left     Surgical History: Past Surgical History:  Procedure Laterality Date   HERNIA REPAIR Left 1998   AND UMBILICAL   HERNIA REPAIR     51 years of age   INGUINAL HERNIA REPAIR Right 01/11/2016   Procedure: HERNIA REPAIR INGUINAL ADULT;  Surgeon: Kieth Brightly, MD;  Location: [facility] ORS;  Service: General;  Laterality: Right;   TONSILLECTOMY     childhood    Home Medications:  Allergies as of 01/03/2022   No Known Allergies      Medication List        Accurate as of January 03, 2022  3:25 PM. If you have any questions, ask your nurse or doctor.          acetaminophen 500 MG tablet  Commonly known as: TYLENOL Take 500 mg by mouth every 6 (six) hours as needed for mild pain.   Butalbital-APAP-Caffeine 50-300-40 MG Caps Take by mouth.   ipratropium 0.03 % nasal spray Commonly known as: ATROVENT Place 2 sprays into both nostrils every 12 (twelve) hours.   levocetirizine 5 MG tablet Commonly known as: XYZAL Take 1 tablet (5 mg total) by mouth every evening.   methocarbamol 500 MG tablet Commonly known as: Robaxin Take 1 tablet (500 mg total) by mouth every 6 (six) hours as needed.   multivitamin tablet Take 1 tablet by mouth daily.   naproxen 500 MG tablet Commonly known as: Naprosyn Take 1 tablet (500 mg total) by mouth 2 (two) times daily with a meal.   prochlorperazine 10 MG tablet Commonly known as: COMPAZINE Take 1 tablet (10 mg total) by mouth every 6 (six) hours as needed for nausea or vomiting (or headache).   tadalafil 5 MG tablet Commonly known as: CIALIS TAKE ONE TABLET BY MOUTH DAILY AS NEEDED FOR ERECTILE DYSFUNCTION        Allergies: No Known Allergies  Family History: Family History  Problem Relation Age of Onset   Hypertension Maternal  Grandmother    Prostate cancer Neg Hx    Kidney cancer Neg Hx    Bladder Cancer Neg Hx     Social History:  reports that he has been smoking cigars and cigarettes. He has never used smokeless tobacco. He reports current alcohol use. He reports that he does not use drugs.  ROS: For pertinent review of systems please refer to history of present illness  Physical Exam: BP 123/73    Pulse 81    Ht 6' (1.829 m)    Wt 180 lb (81.6 kg)    BMI 24.41 kg/m   Constitutional:  Well nourished. Alert and oriented, No acute distress. HEENT: [place] AT, mask in place.  Trachea midline Cardiovascular: No clubbing, cyanosis, or edema. Respiratory: Normal respiratory effort, no increased work of breathing. GU: No CVA tenderness.  No bladder fullness or masses.  Patient with circumcised phallus.  Urethral meatus is patent.  No penile discharge. No penile lesions or rashes. Scrotum without lesions, cysts, rashes and/or edema.  Testicles are located scrotally bilaterally. No masses are appreciated in the testicles. Left and right epididymis are normal. Rectal: Patient with  normal sphincter tone. Anus and perineum without scarring or rashes. No rectal masses are appreciated. Prostate is approximately 60 + grams, could only palpate the apex and part of the midportion of the gland, no nodules are appreciated. Seminal vesicles could not be palpated  Neurologic: Grossly intact, no focal deficits, moving all 4 extremities. Psychiatric: Normal mood and affect.   Laboratory Data: Component     Latest Ref Rng & Units 04/08/2017  Prostate Specific Ag, Serum     0.0 - 4.0 ng/mL 0.8  I have reviewed the labs.   Pertinent Imaging:  01/03/21 15:25  Scan Result 70mL     Assessment & Plan:    1. BPH with LUTS -PSA stable -DRE benign -PVR < 300 cc -continue conservative management, avoiding bladder irritants and timed voiding's -Continue Cialis 5 mg daily  2. Chronic pelvic pain -Has still not yet had a  colonoscopy -Cannot attend physical therapy sessions due to work constraints  Return in about 1 year (around 01/03/2023) for IPSS, PSA and exam.  These notes generated with voice recognition software. I apologize for typographical errors.  Zara Council, [facility]  [address] [place], [place] [address] [phone]

## 2022-01-03 ENCOUNTER — Ambulatory Visit (INDEPENDENT_AMBULATORY_CARE_PROVIDER_SITE_OTHER): Payer: 59 | Admitting: Urology

## 2022-01-03 ENCOUNTER — Encounter: Payer: Self-pay | Admitting: Urology

## 2022-01-03 ENCOUNTER — Other Ambulatory Visit: Payer: Self-pay

## 2022-01-03 VITALS — BP 123/73 | HR 81 | Ht 72.0 in | Wt 180.0 lb

## 2022-01-03 DIAGNOSIS — G8929 Other chronic pain: Secondary | ICD-10-CM

## 2022-01-03 DIAGNOSIS — N138 Other obstructive and reflux uropathy: Secondary | ICD-10-CM

## 2022-01-03 DIAGNOSIS — N401 Enlarged prostate with lower urinary tract symptoms: Secondary | ICD-10-CM

## 2022-01-03 DIAGNOSIS — R1032 Left lower quadrant pain: Secondary | ICD-10-CM

## 2022-02-19 NOTE — Patient Instructions (Signed)

## 2022-02-21 ENCOUNTER — Encounter: Payer: Self-pay | Admitting: Nurse Practitioner

## 2022-02-21 ENCOUNTER — Ambulatory Visit (INDEPENDENT_AMBULATORY_CARE_PROVIDER_SITE_OTHER): Payer: 59 | Admitting: Nurse Practitioner

## 2022-02-21 ENCOUNTER — Other Ambulatory Visit (HOSPITAL_COMMUNITY)
Admission: RE | Admit: 2022-02-21 | Discharge: 2022-02-21 | Disposition: A | Discharge status: Home / Self Care | Payer: 59 | Source: Ambulatory Visit | Attending: Family Medicine | Admitting: Family Medicine

## 2022-02-21 ENCOUNTER — Other Ambulatory Visit: Payer: Self-pay

## 2022-02-21 VITALS — BP 118/80 | HR 92 | Temp 97.9°F | Resp 18 | Ht 72.0 in | Wt 182.2 lb

## 2022-02-21 DIAGNOSIS — Z113 Encounter for screening for infections with a predominantly sexual mode of transmission: Secondary | ICD-10-CM | POA: Insufficient documentation

## 2022-02-21 DIAGNOSIS — Z13220 Encounter for screening for lipoid disorders: Secondary | ICD-10-CM

## 2022-02-21 DIAGNOSIS — Z131 Encounter for screening for diabetes mellitus: Secondary | ICD-10-CM

## 2022-02-21 DIAGNOSIS — Z23 Encounter for immunization: Secondary | ICD-10-CM

## 2022-02-21 DIAGNOSIS — Z1211 Encounter for screening for malignant neoplasm of colon: Secondary | ICD-10-CM

## 2022-02-21 DIAGNOSIS — Z0000 Encounter for general adult medical examination without abnormal findings: Secondary | ICD-10-CM

## 2022-02-21 DIAGNOSIS — R7989 Other specified abnormal findings of blood chemistry: Secondary | ICD-10-CM

## 2022-02-21 DIAGNOSIS — Z125 Encounter for screening for malignant neoplasm of prostate: Secondary | ICD-10-CM

## 2022-02-21 DIAGNOSIS — Z1159 Encounter for screening for other viral diseases: Secondary | ICD-10-CM

## 2022-02-21 NOTE — Progress Notes (Signed)
Name: Joseph Strong   MRN: [id number]    DOB: 1971/08/21   Date:02/21/2022 ? ?     Progress Note ? ?Subjective ? ?Chief Complaint ? ?Chief Complaint  ?Patient presents with  ? Establish Care  ? ? ?HPI ? ?Patient presents for annual CPE, here alone. ? ? IPSS   ? ? [place] Name 02/21/22 1527  ?  ?  ?  ? International Prostate Symptom Score  ? How often have you had the sensation of not emptying your bladder? Less than 1 in 5    ? How often have you had to urinate less than every two hours? Not at All    ? How often have you found you stopped and started again several times when you urinated? Less than 1 in 5 times    ? How often have you found it difficult to postpone urination? Not at All    ? How often have you had a weak urinary stream? Less than 1 in 5 times    ? How often have you had to strain to start urination? Not at All    ? How many times did you typically get up at night to urinate? 2 Times    ? Total IPSS Score 5    ?  ? Quality of Life due to urinary symptoms  ? If you were to spend the rest of your life with your urinary condition just the way it is now how would you feel about that? Pleased    ? ?  ?  ? ?  ?  ?Diet: Well balanced diet, he says he does not drink enough water but he is working on it ? ?Exercise: He says he does a lot of walking at work.  Discussed getting 150 min a week of physical activity. ? ?Sleep: He says he sleeps okay.  He says he gets about 6-7 hours a night ? ?Depression: phq 9 is negative ? ?  02/21/2022  ?  3:26 PM 10/04/2021  ?  2:18 PM 06/12/2021  ?  3:55 PM 02/20/2021  ?  3:37 PM 12/29/2019  ?  3:08 PM  ?Depression screen PHQ 2/9  ?Decreased Interest 0 0 0 0 0  ?Down, Depressed, Hopeless 0 0 0 0 0  ?PHQ - 2 Score 0 0 0 0 0  ?Altered sleeping 0 0 0  0  ?Tired, decreased energy 0 0 0  0  ?Change in appetite 0 0 0  0  ?Feeling bad or failure about yourself  0 0 0  0  ?Trouble concentrating 0 0 0  0  ?Moving slowly or fidgety/restless 0 0 0  0  ?Suicidal thoughts 0 0 0  0  ?PHQ-9 Score 0 0 0   0  ?Difficult doing work/chores Not difficult at all Not difficult at all Not difficult at all  Not difficult at all  ? ? ?Hypertension:  ?BP Readings from Last 3 Encounters:  ?02/21/22 118/80  ?01/03/22 123/73  ?10/04/21 132/82  ? ? ?Obesity: ?Wt Readings from Last 3 Encounters:  ?02/21/22 182 lb 3.2 oz (82.6 kg)  ?01/03/22 180 lb (81.6 kg)  ?10/04/21 175 lb 9.6 oz (79.7 kg)  ? ?BMI Readings from Last 3 Encounters:  ?02/21/22 24.71 kg/m?  ?01/03/22 24.41 kg/m?  ?10/04/21 23.82 kg/m?  ?  ? ?Lipids:  ?Lab Results  ?Component Value Date  ? CHOL 160 12/29/2019  ? CHOL 167 12/10/2018  ? CHOL 169 12/01/2017  ? ?Lab Results  ?Component Value  Date  ? HDL 57 12/29/2019  ? HDL 57 12/10/2018  ? HDL 67 12/01/2017  ? ?Lab Results  ?Component Value Date  ? [place] 90 12/29/2019  ? [place] 95 12/10/2018  ? [place] 88 12/01/2017  ? ?Lab Results  ?Component Value Date  ? TRIG 44 12/29/2019  ? TRIG 61 12/10/2018  ? TRIG 46 12/01/2017  ? ?Lab Results  ?Component Value Date  ? CHOLHDL 2.8 12/29/2019  ? CHOLHDL 2.9 12/10/2018  ? CHOLHDL 2.5 12/01/2017  ? ?No results found for: LDLDIRECT ?Glucose:  ?Glucose  ?Date Value Ref Range Status  ?09/30/2014 92 65 - 99 mg/dL Final  ?02/22/2014 83 65 - 99 mg/dL Final  ?03/15/2013 56 (L) 65 - 99 mg/dL Final  ? ?Glucose, Bld  ?Date Value Ref Range Status  ?05/19/2021 130 (H) 70 - 99 mg/dL Final  ?  Comment:  ?  Glucose reference range applies only to samples taken after fasting for at least 8 hours.  ?02/25/2021 127 (H) 70 - 99 mg/dL Final  ?  Comment:  ?  Glucose reference range applies only to samples taken after fasting for at least 8 hours.  ?02/20/2021 70 65 - 99 mg/dL Final  ?  Comment:  ?  . ?           Fasting reference interval ?. ?  ? ? ?[place] [facility] Visit from 02/21/2022 in [facility]  ?AUDIT-C Score 1  ? ?  ? ? ?Single ?STD testing and prevention (HIV/chl/gon/syphilis): 10/04/21, ordered today ?Hep C: 02/20/2021, ordered today ? ?Skin cancer: Discussed  monitoring for atypical lesions ?Colorectal cancer: due, discussed options ?Prostate cancer: will get PSA ?Lab Results  ?Component Value Date  ? PSA 0.7 11/05/2016  ? ? ? ?Lung cancer:   Low Dose CT Chest recommended if Age 54-80 years, 30 pack-year currently smoking OR have quit w/in 15years. Patient does not qualify.   ?AAA:  The USPSTF recommends one-time screening with ultrasonography in men ages 56 to 82 years who have ever smoked ?ECG:  02/25/2021 ? ?Vaccines:  ?HPV: up to at age 61 , ask insurance if age between 40-45  ?Shingrix: 67-64 yo and ask insurance if covered when patient above 73 yo ?Pneumonia:  educated and discussed with patient. ?Flu:  educated and discussed with patient. ? ?Advanced Care Planning: A voluntary discussion about advance care planning including the explanation and discussion of advance directives.  Discussed health care proxy and Living will, and the patient was able to identify a health care proxy as , he is going to think about it.  Patient does not have a living will at present time. If patient does have living will, I have requested they bring this to the clinic to be scanned in to their chart. ? ?Patient Active Problem List  ? Diagnosis Date Noted  ? Chronic pain syndrome 03/04/2021  ? Pharmacologic therapy 03/04/2021  ? Disorder of skeletal system 03/04/2021  ? Problems influencing health status 03/04/2021  ? Pancreas divisum 10/09/2018  ? Constipation 10/09/2018  ? Ventral hernia without obstruction or gangrene 10/09/2018  ? Prostate cancer screening 09/16/2017  ? Abdominal pain, periumbilical [id number]  ? Decreased frequency of bowel movements 01/10/2016  ? Paraspinal back pain (Left) 12/08/2015  ? Dizziness, nonspecific 10/31/2015  ? Abnormal TSH 10/31/2015  ? Chronic groin pain (Left) 10/31/2015  ? Abnormal EKG 10/31/2015  ? Testicular pain (Left) 09/20/2015  ? S/P unilateral inguinal hernia repair 09/20/2015  ? ? ?Past Surgical History:  ?  Procedure Laterality Date  ? HERNIA  REPAIR Left 1998  ? AND UMBILICAL  ? HERNIA REPAIR    ? 51 years of age  ? INGUINAL HERNIA REPAIR Right 01/11/2016  ? Procedure: HERNIA REPAIR INGUINAL ADULT;  Surgeon: Christene Lye, MD;  Location: [facility] ORS;  Service: General;  Laterality: Right;  ? TONSILLECTOMY    ? childhood  ? ? ?Family History  ?Problem Relation Age of Onset  ? Hypertension Maternal Grandmother   ? Prostate cancer Neg Hx   ? Kidney cancer Neg Hx   ? Bladder Cancer Neg Hx   ? ? ?Social History  ? ?Socioeconomic History  ? Marital status: Single  ?  Spouse name: Not on file  ? Number of children: 1  ? Years of education: 24  ? Highest education level: 11th grade  ?Occupational History  ? Not on file  ?Tobacco Use  ? Smoking status: Every Day  ?  Types: Cigars, Cigarettes  ? Smokeless tobacco: Never  ?Vaping Use  ? Vaping Use: Never used  ?Substance and Sexual Activity  ? Alcohol use: Yes  ?  Alcohol/week: 0.0 standard drinks  ?  Comment: occasional  ? Drug use: No  ? Sexual activity: Yes  ?Other Topics Concern  ? Not on file  ?Social History Narrative  ? Not on file  ? ?Social Determinants of Health  ? ?Financial Resource Strain: Low Risk   ? Difficulty of Paying Living Expenses: Not hard at all  ?Food Insecurity: No Food Insecurity  ? Worried About Charity fundraiser in the Last Year: Never true  ? Ran Out of Food in the Last Year: Never true  ?Transportation Needs: No Transportation Needs  ? Lack of Transportation (Medical): No  ? Lack of Transportation (Non-Medical): No  ?Physical Activity: Insufficiently Active  ? Days of Exercise per Week: 3 days  ? Minutes of Exercise per Session: 40 min  ?Stress: No Stress Concern Present  ? Feeling of Stress : Not at all  ?Social Connections: Moderately Integrated  ? Frequency of Communication with Friends and Family: More than three times a week  ? Frequency of Social Gatherings with Friends and Family: More than three times a week  ? Attends Religious Services: More than 4 times per year  ?  Active Member of Clubs or Organizations: Yes  ? Attends Archivist Meetings: More than 4 times per year  ? Marital Status: Never married  ?Intimate Partner Violence: Not At Risk  ? Fear of Current

## 2022-02-22 ENCOUNTER — Telehealth: Payer: Self-pay

## 2022-02-22 ENCOUNTER — Other Ambulatory Visit: Payer: Self-pay | Admitting: Nurse Practitioner

## 2022-02-22 DIAGNOSIS — R7989 Other specified abnormal findings of blood chemistry: Secondary | ICD-10-CM

## 2022-02-22 LAB — COMPLETE METABOLIC PANEL WITH GFR
AG Ratio: 1.4 (calc) (ref 1.0–2.5)
ALT: 13 U/L (ref 9–46)
AST: 19 U/L (ref 10–35)
Albumin: 4.1 g/dL (ref 3.6–5.1)
Alkaline phosphatase (APISO): 60 U/L (ref 35–144)
BUN: 17 mg/dL (ref 7–25)
CO2: 23 mmol/L (ref 20–32)
Calcium: 8.9 mg/dL (ref 8.6–10.3)
Chloride: 105 mmol/L (ref 98–110)
Creat: 1.22 mg/dL (ref 0.70–1.30)
Globulin: 2.9 g/dL (calc) (ref 1.9–3.7)
Glucose, Bld: 89 mg/dL (ref 65–99)
Potassium: 4.1 mmol/L (ref 3.5–5.3)
Sodium: 137 mmol/L (ref 135–146)
Total Bilirubin: 0.3 mg/dL (ref 0.2–1.2)
Total Protein: 7 g/dL (ref 6.1–8.1)
eGFR: 72 mL/min/{1.73_m2} (ref >=60)

## 2022-02-22 LAB — URINALYSIS, COMPLETE
Bacteria, UA: NONE SEEN /HPF
Bilirubin Urine: NEGATIVE
Glucose, UA: NEGATIVE
Hgb urine dipstick: NEGATIVE
Hyaline Cast: NONE SEEN /LPF
Ketones, ur: NEGATIVE
Leukocytes,Ua: NEGATIVE
Nitrite: NEGATIVE
Protein, ur: NEGATIVE
RBC / HPF: NONE SEEN /HPF (ref 0–2)
Specific Gravity, Urine: 1.007 (ref 1.001–1.035)
Squamous Epithelial / HPF: NONE SEEN /HPF (ref <=5)
WBC, UA: NONE SEEN /HPF (ref 0–5)
pH: 6 (ref 5.0–8.0)

## 2022-02-22 LAB — CBC WITH DIFFERENTIAL/PLATELET
Absolute Monocytes: 767 cells/uL (ref 200–950)
Basophils Absolute: 41 cells/uL (ref 0–200)
Basophils Relative: 0.7 %
Eosinophils Absolute: 159 cells/uL (ref 15–500)
Eosinophils Relative: 2.7 %
HCT: 47.9 % (ref 38.5–50.0)
Hemoglobin: 15.7 g/dL (ref 13.2–17.1)
Lymphs Abs: 1929 cells/uL (ref 850–3900)
MCH: 28.4 pg (ref 27.0–33.0)
MCHC: 32.8 g/dL (ref 32.0–36.0)
MCV: 86.6 fL (ref 80.0–100.0)
MPV: 10.7 fL (ref 7.5–12.5)
Monocytes Relative: 13 %
Neutro Abs: 3003 cells/uL (ref 1500–7800)
Neutrophils Relative %: 50.9 %
Platelets: 244 10*3/uL (ref 140–400)
RBC: 5.53 10*6/uL (ref 4.20–5.80)
RDW: 12.3 % (ref 11.0–15.0)
Total Lymphocyte: 32.7 %
WBC: 5.9 10*3/uL (ref 3.8–10.8)

## 2022-02-22 LAB — LIPID PANEL
Cholesterol: 161 mg/dL (ref <200)
HDL: 50 mg/dL (ref >=40)
LDL Cholesterol (Calc): 94 mg/dL (calc)
Non-HDL Cholesterol (Calc): 111 mg/dL (calc) (ref <130)
Total CHOL/HDL Ratio: 3.2 (calc) (ref <5.0)
Triglycerides: 78 mg/dL (ref <150)

## 2022-02-22 LAB — HEMOGLOBIN A1C
Hgb A1c MFr Bld: 5.5 % of total Hgb (ref <5.7)
Mean Plasma Glucose: 111 mg/dL
eAG (mmol/L): 6.2 mmol/L

## 2022-02-22 LAB — HIV ANTIBODY (ROUTINE TESTING W REFLEX): HIV 1&2 Ab, 4th Generation: NONREACTIVE

## 2022-02-22 LAB — HEPATITIS C ANTIBODY
Hepatitis C Ab: NONREACTIVE
SIGNAL TO CUT-OFF: 0.1 (ref <1.00)

## 2022-02-22 LAB — TSH: TSH: 0.39 mIU/L — ABNORMAL LOW (ref 0.40–4.50)

## 2022-02-22 LAB — PSA: PSA: 0.64 ng/mL (ref <=4.00)

## 2022-02-22 NOTE — Telephone Encounter (Signed)
CALLED PATIENT NO ANSWER LEFT VOICEMAIL FOR A CALL BACK ? ?

## 2022-02-25 ENCOUNTER — Telehealth: Payer: Self-pay

## 2022-02-25 LAB — URINE CYTOLOGY ANCILLARY ONLY
Chlamydia: NEGATIVE
Comment: NEGATIVE
Comment: NORMAL
Neisseria Gonorrhea: NEGATIVE

## 2022-02-25 NOTE — Telephone Encounter (Signed)
Copied from CRM [id number]. Topic: General - Other ?>> Feb 25, 2022  1:20 PM McGill, Joseph Strong wrote: ?Reason for CRM: Pt is calling back for labs.  ? ?Pt requesting a call back. ?

## 2022-02-25 NOTE — Telephone Encounter (Signed)
Called pt no answer, results have been sent through mail per [facility] as what I see. Unable to leave vm to call back. ?

## 2022-02-26 ENCOUNTER — Telehealth: Payer: Self-pay

## 2022-02-26 NOTE — Telephone Encounter (Signed)
CALLED NO ANSWER NO VOICEMAIL

## 2022-02-28 ENCOUNTER — Telehealth: Payer: Self-pay

## 2022-02-28 NOTE — Telephone Encounter (Signed)
Called no answer no voicemail letter sent ?

## 2022-03-25 ENCOUNTER — Ambulatory Visit (INDEPENDENT_AMBULATORY_CARE_PROVIDER_SITE_OTHER): Payer: 59 | Admitting: Surgery

## 2022-03-25 ENCOUNTER — Encounter: Payer: Self-pay | Admitting: Surgery

## 2022-03-25 VITALS — BP 138/77 | HR 98 | Temp 98.6°F | Ht 72.0 in | Wt 181.0 lb

## 2022-03-25 DIAGNOSIS — K432 Incisional hernia without obstruction or gangrene: Secondary | ICD-10-CM

## 2022-03-25 DIAGNOSIS — Z1211 Encounter for screening for malignant neoplasm of colon: Secondary | ICD-10-CM

## 2022-03-25 DIAGNOSIS — G8929 Other chronic pain: Secondary | ICD-10-CM | POA: Diagnosis not present

## 2022-03-25 DIAGNOSIS — K420 Umbilical hernia with obstruction, without gangrene: Secondary | ICD-10-CM

## 2022-03-25 DIAGNOSIS — R1032 Left lower quadrant pain: Secondary | ICD-10-CM | POA: Diagnosis not present

## 2022-03-25 DIAGNOSIS — K4091 Unilateral inguinal hernia, without obstruction or gangrene, recurrent: Secondary | ICD-10-CM | POA: Diagnosis not present

## 2022-03-25 NOTE — Progress Notes (Signed)
?03/25/2022 ? ?History of Present Illness: ?Joseph Strong is a 51 y.o. male presenting for evaluation of right groin pain.  The patient is known to our practice and has had prior umbilical and left inguinal hernia repair in 1998 and an open right inguinal hernia repair in 2017 with Dr. Evette Cristal.  Was seen by Dr. Excell Seltzer in 2019 for recurrence of umbilical hernia.  He was then seen by me in 2021 for the same and a CT scan was done on 08/14/20 which showed a recurrent umbilical hernia at the superior portion of the repair site.  He had been hesitant about having surgery and has followed up with me intermittently.  He has reported in the past left groin pain and now presents with newer onset of right groin pain.  Reports that about 10 days ago he started noticing right groin pain that radiated towards this back and also towards his thigh.  Denies constant pain but reports it was aggravated at work when he does heavy lifting.  Denies worsening pain in the left groin, but reports some persistent soreness there.  He also reports persistent soreness in the supraumbilical region.   ? ?Past Medical History: ?Past Medical History:  ?Diagnosis Date  ? Burning with urination 01-10-16  ? Constipation   ? Dysrhythmia   ? "OCCASSIONAL SPEEDING UP OF HEART RATE" PT THINKING IT MAY BE DUE TO HERNIA  ? Enlarged prostate   ? Headache   ? MIGRAINES  ? Inguinal hernia   ? left   ?  ? ?Past Surgical History: ?Past Surgical History:  ?Procedure Laterality Date  ? HERNIA REPAIR Left 1998  ? AND UMBILICAL  ? HERNIA REPAIR    ? 51 years of age  ? INGUINAL HERNIA REPAIR Right 01/11/2016  ? Procedure: HERNIA REPAIR INGUINAL ADULT;  Surgeon: Kieth Brightly, MD;  Location: [facility] ORS;  Service: General;  Laterality: Right;  ? TONSILLECTOMY    ? childhood  ? ? ?Home Medications: ?Prior to Admission medications   ?Medication Sig Start Date End Date Taking? Authorizing Provider  ?acetaminophen (TYLENOL) 500 MG tablet Take 500 mg by mouth every 6  (six) hours as needed for mild pain.   Yes [provider]  ?Multiple Vitamin (MULTIVITAMIN) tablet Take 1 tablet by mouth daily.   Yes [provider]  ?tadalafil (CIALIS) 5 MG tablet TAKE ONE TABLET BY MOUTH DAILY AS NEEDED FOR ERECTILE DYSFUNCTION 12/07/21  Yes McGowan, Carollee Herter A, PA-C  ? ? ?Allergies: ?No Known Allergies ? ?Review of Systems: ?Review of Systems  ?Constitutional:  Negative for chills and fever.  ?Respiratory:  Negative for shortness of breath.   ?Cardiovascular:  Negative for chest pain.  ?Gastrointestinal:  Positive for abdominal pain. Negative for constipation, diarrhea, nausea and vomiting.  ?Genitourinary:  Negative for dysuria.  ?Skin:  Negative for rash.  ? ?Physical Exam ?BP 138/77   Pulse 98   Temp 98.6 ?F (37 ?C)   Ht 6' (1.829 m)   Wt 181 lb (82.1 kg)   BMI 24.55 kg/m?  ?CONSTITUTIONAL: No acute distress, well-nourished ?HEENT:  Normocephalic, atraumatic, extraocular motion intact. ?RESPIRATORY:  Lungs are clear, and breath sounds are equal bilaterally. Normal respiratory effort without pathologic use of accessory muscles. ?CARDIOVASCULAR: Heart is regular without murmurs, gallops, or rubs. ?GI: The abdomen is soft, nondistended, with tenderness to palpation in the supraumbilical area, right groin, and left groin.  In the supraumbilical area, the patient does have a known recurrent umbilical hernia containing fat which is  perhaps a little bit bigger than it was on his previous exam with me.  In the right groin, he does have evidence of a recurrent right inguinal hernia.  In the left groin, I am unable to fully discern if he has a recurrent hernia or not.   ?NEUROLOGIC:  Motor and sensation is grossly normal.  Cranial nerves are grossly intact. ?PSYCH:  Alert and oriented to person, place and time. Affect is normal. ? ?Labs/Imaging: ?Labs from 02/21/2022: ?Sodium 137, potassium 4.1, chloride 105, CO2 23, BUN 17, creatinine 1.22.  Total bilirubin 0.3, AST 19, ALT 13,  alkaline phosphatase 60.  WBC 5.9, hemoglobin 15.7, hematocrit 47.9, platelets 244.  Hemoglobin A1c 5.5. ? ?CT abdomen/pelvis on 08/07/2020: ?IMPRESSION: ?1. No acute abdominal/pelvic findings, mass lesions or adenopathy. ?2. Surgical changes from prior anterior abdominal wall hernia ?repair. A small recurrent hernia is noted in the midline. ?3. Stable surgical changes from a right inguinal hernia repair. No ?recurrent hernia. ? ? ?Assessment and Plan: ?This is a 51 y.o. male with recurrent umbilical hernia, recurrent right inguinal hernia, and possible recurrent left inguinal hernia. ? ?--Discussed with the patient the findings on his exam and reviewed the CT scan results with him from 2021.  Overall my concern is that now he does have evidence of a recurrent right inguinal hernia.  It is still unclear if he has a recurrence on the left groin, but discussed with him that since things continue to progress, we should proceed with surgical repair of his hernias.  Although he's having symptoms, they are not constant, and only aggravated with heavy or strenuous activity.  He need to talk to his job and is wanting to wait towards the summer to have surgery.  I think for now that's reasonable but discussed with him again the recommendation for surgery and that waiting longer will only allow for more progression. ?--He will discuss with him work about timing for surgery and being on leave for 6 weeks afterwards.  He believes there's FMLA paperwork to fill out so he will get that and bring it to our office so we can start on it. ?--He will follow up with me towards mid July or sooner if his work can let him off sooner to schedule surgery.  Return precautions given. ? ?I spent 25 minutes dedicated to the care of this patient on the date of this encounter to include pre-visit review of records, face-to-face time with the patient discussing diagnosis and management, and any post-visit coordination of care. ? ? ?[name], MD ?[facility] ? ? ?  ?

## 2022-03-25 NOTE — Patient Instructions (Addendum)
Follow up here in July to speak about scheduling surgery and to go over information. Call us if you would like to do this sooner.  ? ?We have spoken today about having your hernias repaired. See the information below.  ? ?You will need to arrange to be out of work for approximately 1-2 weeks and then you may return with a lifting restriction of no more than 15 pounds for 4 more weeks. If you have FMLA or Disability paperwork that needs to be filled out, please have your company fax your paperwork to [phone] or you may drop this by either office. This paperwork will be filled out within 3 days after your surgery has been completed. ? ?You may have a bruise in your groin and also swelling and brusing in your testicle area. You may use ice 4-5 times daily for 15-20 minutes each time. Make sure that you place a barrier between you and the ice pack. To decrease the swelling, you may roll up a bath towel and place it vertically in between your thighs with your testicles resting on the towel. You will want to keep this area elevated as much as possible for several days following surgery. ? ? ? ?Inguinal Hernia, Adult ?Muscles help keep everything in the body in its proper place. But if a weak spot in the muscles develops, something can poke through. That is called a hernia. When this happens in the lower part of the belly (abdomen), it is called an inguinal hernia. (It takes its name from a part of the body in this region called the inguinal canal.) A weak spot in the wall of muscles lets some fat or part of the small intestine bulge through. An inguinal hernia can develop at any age. Men get them more often than women. ?CAUSES  ?In adults, an inguinal hernia develops over time. ?It can be triggered by: ?Suddenly straining the muscles of the lower abdomen. ?Lifting heavy objects. ?Straining to have a bowel movement. Difficult bowel movements (constipation) can lead to this. ?Constant coughing. This may be caused by  smoking or lung disease. ?Being overweight. ?Being pregnant. ?Working at a job that requires long periods of standing or heavy lifting. ?Having had an inguinal hernia before. ?One type can be an emergency situation. It is called a strangulated inguinal hernia. It develops if part of the small intestine slips through the weak spot and cannot get back into the abdomen. The blood supply can be cut off. If that happens, part of the intestine may die. This situation requires emergency surgery. ?SYMPTOMS  ?Often, a small inguinal hernia has no symptoms. It is found when a healthcare provider does a physical exam. Larger hernias usually have symptoms.  ?In adults, symptoms may include: ?A lump in the groin. This is easier to see when the person is standing. It might disappear when lying down. ?In men, a lump in the scrotum. ?Pain or burning in the groin. This occurs especially when lifting, straining or coughing. ?A dull ache or feeling of pressure in the groin. ?Signs of a strangulated hernia can include: ?A bulge in the groin that becomes very painful and tender to the touch. ?A bulge that turns red or purple. ?Fever, nausea and vomiting. ?Inability to have a bowel movement or to pass gas. ?DIAGNOSIS  ?To decide if you have an inguinal hernia, a healthcare provider will probably do a physical examination. ?This will include asking questions about any symptoms you have noticed. ?The healthcare provider might  feel the groin area and ask you to cough. If an inguinal hernia is felt, the healthcare provider may try to slide it back into the abdomen. ?Usually no other tests are needed. ?TREATMENT  ?Treatments can vary. The size of the hernia makes a difference. Options include: ?Watchful waiting. This is often suggested if the hernia is small and you have had no symptoms. ?No medical procedure will be done unless symptoms develop. ?You will need to watch closely for symptoms. If any occur, contact your healthcare provider  right away. ?Surgery. This is used if the hernia is larger or you have symptoms. ?Open surgery. This is usually an outpatient procedure (you will not stay overnight in a hospital). An cut (incision) is made through the skin in the groin. The hernia is put back inside the abdomen. The weak area in the muscles is then repaired by herniorrhaphy or hernioplasty. Herniorrhaphy: in this type of surgery, the weak muscles are sewn back together. Hernioplasty: a patch or mesh is used to close the weak area in the abdominal wall. ?Laparoscopy. In this procedure, a surgeon makes small incisions. A thin tube with a tiny video camera (called a laparoscope) is put into the abdomen. The surgeon repairs the hernia with mesh by looking with the video camera and using two long instruments. ?HOME CARE INSTRUCTIONS  ?After surgery to repair an inguinal hernia: ?You will need to take pain medicine prescribed by your healthcare provider. Follow all directions carefully. ?You will need to take care of the wound from the incision. ?Your activity will be restricted for awhile. This will probably include no heavy lifting for several weeks. You also should not do anything too active for a few weeks. When you can return to work will depend on the type of job that you have. ?During "watchful waiting" periods, you should: ?Maintain a healthy weight. ?Eat a diet high in fiber (fruits, vegetables and whole grains). ?Drink plenty of fluids to avoid constipation. This means drinking enough water and other liquids to keep your urine clear or pale yellow. ?Do not lift heavy objects. ?Do not stand for long periods of time. ?Quit smoking. This should keep you from developing a frequent cough. ?SEEK MEDICAL CARE IF:  ?A bulge develops in your groin area. ?You feel pain, a burning sensation or pressure in the groin. This might be worse if you are lifting or straining. ?You develop a fever of more than 100.5? F (38.1? C). ?SEEK IMMEDIATE MEDICAL CARE IF:   ?Pain in the groin increases suddenly. ?A bulge in the groin gets bigger suddenly and does not go down. ?For men, there is sudden pain in the scrotum. Or, the size of the scrotum increases. ?A bulge in the groin area becomes red or purple and is painful to touch. ?You have nausea or vomiting that does not go away. ?You feel your heart beating much faster than normal. ?You cannot have a bowel movement or pass gas. ?You develop a fever of more than 102.0? F (38.9? C). ?  ?This information is not intended to replace advice given to you by your health care provider. Make sure you discuss any questions you have with your health care provider. ?  ?Document Released: 03/30/2009 Document Revised: 02/03/2012 Document Reviewed: 05/15/2015 ?Elsevier Interactive Patient Education ?2016 [place]. ? ?

## 2022-03-27 ENCOUNTER — Telehealth: Payer: Self-pay

## 2022-03-27 NOTE — Telephone Encounter (Signed)
CALLED PATIENT NO ANSWER LEFT VOICEMAIL FOR A CALL BACK °Letter sent °

## 2022-03-27 NOTE — Telephone Encounter (Signed)
Pt left message to schedule colonoscopy he was having some phone problems and transportation problems phone# [phone] ?

## 2022-03-27 NOTE — Telephone Encounter (Signed)
Pt left message to set up colonoscopy  he states that he was having some difficulities with transportation. [phone] ?

## 2022-03-28 ENCOUNTER — Telehealth: Payer: Self-pay

## 2022-03-28 NOTE — Telephone Encounter (Signed)
Called no answer no voicemail

## 2022-04-04 ENCOUNTER — Ambulatory Visit: Payer: 59 | Admitting: Family Medicine

## 2022-04-08 ENCOUNTER — Telehealth: Payer: Self-pay

## 2022-04-08 ENCOUNTER — Other Ambulatory Visit: Payer: Self-pay

## 2022-04-08 DIAGNOSIS — Z1211 Encounter for screening for malignant neoplasm of colon: Secondary | ICD-10-CM

## 2022-04-08 MED ORDER — NA SULFATE-K SULFATE-MG SULF 17.5-3.13-1.6 GM/177ML PO SOLN: 1.0000 | Freq: Once | ORAL | 0 refills | Status: AC

## 2022-04-08 NOTE — Telephone Encounter (Signed)
Gastroenterology Pre-Procedure Review ? ?Request Date: 05/10/22 ?Requesting Physician: Dr. Allegra Lai ? ?PATIENT REVIEW QUESTIONS: The patient responded to the following health history questions as indicated:   ? ?1. Are you having any GI issues?  Umbilical hernia ?2. Do you have a personal history of Polyps? no ?3. Do you have a family history of Colon Cancer or Polyps? no ?4. Diabetes Mellitus? no ?5. Joint replacements in the past 12 months?no ?6. Major health problems in the past 3 months?no ?7. Any artificial heart valves, MVP, or defibrillator?no ?   ?MEDICATIONS & ALLERGIES:    ?Patient reports the following regarding taking any anticoagulation/antiplatelet therapy:   ?Plavix, Coumadin, Eliquis, Xarelto, Lovenox, Pradaxa, Brilinta, or Effient? no ?Aspirin? no ? ?Patient confirms/reports the following medications:  ?Current Outpatient Medications  ?Medication Sig Dispense Refill  ? acetaminophen (TYLENOL) 500 MG tablet Take 500 mg by mouth every 6 (six) hours as needed for mild pain.    ? Multiple Vitamin (MULTIVITAMIN) tablet Take 1 tablet by mouth daily.    ? tadalafil (CIALIS) 5 MG tablet TAKE ONE TABLET BY MOUTH DAILY AS NEEDED FOR ERECTILE DYSFUNCTION 90 tablet 3  ? ?No current facility-administered medications for this visit.  ? ? ?Patient confirms/reports the following allergies:  ?No Known Allergies ? ?No orders of the defined types were placed in this encounter. ? ? ?AUTHORIZATION INFORMATION ?Primary Insurance: ?1D#: ?Group #: ? ?Secondary Insurance: ?1D#: ?Group #: ? ?SCHEDULE INFORMATION: ?Date: 05/10/22 ?Time: ?Location: [facility] ?

## 2022-04-27 LAB — THYROID PANEL WITH TSH
Free Thyroxine Index: 2.4 (ref 1.4–3.8)
T3 Uptake: 33 % (ref 22–35)
T4, Total: 7.3 ug/dL (ref 4.9–10.5)
TSH: 0.42 mIU/L (ref 0.40–4.50)

## 2022-04-29 ENCOUNTER — Ambulatory Visit: Payer: Self-pay | Admitting: *Deleted

## 2022-04-29 ENCOUNTER — Telehealth: Payer: Self-pay | Admitting: Nurse Practitioner

## 2022-04-29 NOTE — Telephone Encounter (Unsigned)
Copied from CRM [id number]. Topic: General - Other >> Apr 29, 2022  9:52 AM Wyonia Hough E wrote: Reason for CRM: pt returned Helen's call about his lab result / please advise

## 2022-04-29 NOTE — Telephone Encounter (Signed)
Called patient back regarding labs.

## 2022-04-29 NOTE — Telephone Encounter (Signed)
Pt given lab results per notes of Della Goo, FNP 04/29/22 on 04/29/22. Pt verbalized understanding.

## 2022-05-10 ENCOUNTER — Ambulatory Visit: Payer: 59 | Admitting: Anesthesiology

## 2022-05-10 ENCOUNTER — Encounter: Payer: Self-pay | Admitting: Gastroenterology

## 2022-05-10 ENCOUNTER — Encounter
Admission: RE | Disposition: A | Discharge status: Home / Self Care | Payer: Self-pay | Source: Home / Self Care | Attending: Gastroenterology

## 2022-05-10 ENCOUNTER — Ambulatory Visit
Admission: RE | Admit: 2022-05-10 | Discharge: 2022-05-10 | Disposition: A | Discharge status: Home / Self Care | Payer: 59 | Attending: Gastroenterology | Admitting: Gastroenterology

## 2022-05-10 DIAGNOSIS — Z1211 Encounter for screening for malignant neoplasm of colon: Secondary | ICD-10-CM | POA: Insufficient documentation

## 2022-05-10 DIAGNOSIS — F17210 Nicotine dependence, cigarettes, uncomplicated: Secondary | ICD-10-CM | POA: Diagnosis not present

## 2022-05-10 DIAGNOSIS — F17290 Nicotine dependence, other tobacco product, uncomplicated: Secondary | ICD-10-CM | POA: Insufficient documentation

## 2022-05-10 DIAGNOSIS — Z8719 Personal history of other diseases of the digestive system: Secondary | ICD-10-CM | POA: Diagnosis not present

## 2022-05-10 HISTORY — PX: COLONOSCOPY WITH PROPOFOL: SHX5780

## 2022-05-10 SURGERY — COLONOSCOPY WITH PROPOFOL
Anesthesia: General

## 2022-05-10 MED ORDER — PROPOFOL 10 MG/ML IV BOLUS
INTRAVENOUS | Status: DC | PRN
  Administered 2022-05-10: 70 mg via INTRAVENOUS

## 2022-05-10 MED ORDER — LIDOCAINE HCL (CARDIAC) PF 100 MG/5ML IV SOSY
PREFILLED_SYRINGE | INTRAVENOUS | Status: DC | PRN
  Administered 2022-05-10: 100 mg via INTRAVENOUS

## 2022-05-10 MED ORDER — PROPOFOL 500 MG/50ML IV EMUL
INTRAVENOUS | Status: DC | PRN
  Administered 2022-05-10: 140 ug/kg/min via INTRAVENOUS

## 2022-05-10 MED ORDER — STERILE WATER FOR IRRIGATION IR SOLN
Status: DC | PRN
  Administered 2022-05-10: 60 mL

## 2022-05-10 MED ORDER — SODIUM CHLORIDE 0.9 % IV SOLN: INTRAVENOUS | Status: DC

## 2022-05-10 MED ORDER — DEXMEDETOMIDINE (PRECEDEX) IN NS 20 MCG/5ML (4 MCG/ML) IV SYRINGE
PREFILLED_SYRINGE | INTRAVENOUS | Status: DC | PRN
  Administered 2022-05-10: 8 ug via INTRAVENOUS

## 2022-05-10 NOTE — Transfer of Care (Signed)
Immediate Anesthesia Transfer of Care Note  Patient: Joseph Strong  Procedure(s) Performed: COLONOSCOPY WITH PROPOFOL  Patient Location: PACU  Anesthesia Type:General  Level of Consciousness: awake, alert  and oriented  Airway & Oxygen Therapy: Patient Spontanous Breathing  Post-op Assessment: Report given to RN and Post -op Vital signs reviewed and stable  Post vital signs: Reviewed and stable  Last Vitals:  Vitals Value Taken Time  BP 107/65 05/10/22 1020  Temp    Pulse 66 05/10/22 1023  Resp 21 05/10/22 1023  SpO2 97 % 05/10/22 1023  Vitals shown include unvalidated device data.  Last Pain:  Vitals:   05/10/22 1020  TempSrc:   PainSc: 0-No pain         Complications: No notable events documented.

## 2022-05-10 NOTE — Anesthesia Postprocedure Evaluation (Signed)
Anesthesia Post Note  Patient: Joseph Strong  Procedure(s) Performed: COLONOSCOPY WITH PROPOFOL  Patient location during evaluation: Endoscopy Anesthesia Type: General Level of consciousness: awake and alert Pain management: pain level controlled Vital Signs Assessment: post-procedure vital signs reviewed and stable Respiratory status: spontaneous breathing, nonlabored ventilation, respiratory function stable and patient connected to nasal cannula oxygen Cardiovascular status: blood pressure returned to baseline and stable Postop Assessment: no apparent nausea or vomiting Anesthetic complications: no   No notable events documented.   Last Vitals:  Vitals:   05/10/22 1020 05/10/22 1029  BP: 107/65   Pulse: 64   Resp: 20 20  Temp:  (!) 35.9 C  SpO2: 98%     Last Pain:  Vitals:   05/10/22 1038  TempSrc:   PainSc: 0-No pain                 Cleda Mccreedy Mateja Dier

## 2022-05-10 NOTE — H&P (Signed)
Joseph Repress, MD [address]  [address]  [place], [place] [address]  Main: [phone]  Fax: [phone] Pager: [phone]  Primary Care Physician:  Danelle Berry, PA-C Primary Gastroenterologist:  Dr. Arlyss Strong  Pre-Procedure History & Physical: HPI:  Joseph Strong is a 51 y.o. male is here for an colonoscopy.   Past Medical History:  Diagnosis Date   Burning with urination 01-10-16   Constipation    Dysrhythmia    "OCCASSIONAL SPEEDING UP OF HEART RATE" PT THINKING IT MAY BE DUE TO HERNIA   Enlarged prostate    Headache    MIGRAINES   Inguinal hernia    left     Past Surgical History:  Procedure Laterality Date   HERNIA REPAIR Left 1998   AND UMBILICAL   HERNIA REPAIR     51 years of age   INGUINAL HERNIA REPAIR Right 01/11/2016   Procedure: HERNIA REPAIR INGUINAL ADULT;  Surgeon: Kieth Brightly, MD;  Location: [facility] ORS;  Service: General;  Laterality: Right;   TONSILLECTOMY     childhood    Prior to Admission medications   Medication Sig Start Date End Date Taking? Authorizing Provider  Multiple Vitamin (MULTIVITAMIN) tablet Take 1 tablet by mouth daily.   Yes [provider]  tadalafil (CIALIS) 5 MG tablet TAKE ONE TABLET BY MOUTH DAILY AS NEEDED FOR ERECTILE DYSFUNCTION 12/07/21  Yes McGowan, Carollee Herter A, PA-C  acetaminophen (TYLENOL) 500 MG tablet Take 500 mg by mouth every 6 (six) hours as needed for mild pain.    [provider]    Allergies as of 04/08/2022   (No Known Allergies)    Family History  Problem Relation Age of Onset   Hypertension Maternal Grandmother    Prostate cancer Neg Hx    Kidney cancer Neg Hx    Bladder Cancer Neg Hx     Social History   Socioeconomic History   Marital status: Single    Spouse name: Not on file   Number of children: 1   Years of education: 11   Highest education level: 11th grade  Occupational History   Not on file  Tobacco Use   Smoking status: Every Day    Types:  Cigars, Cigarettes   Smokeless tobacco: Never  Vaping Use   Vaping Use: Never used  Substance and Sexual Activity   Alcohol use: Yes    Alcohol/week: 0.0 standard drinks of alcohol    Comment: occasional   Drug use: No   Sexual activity: Yes  Other Topics Concern   Not on file  Social History Narrative   Not on file   Social Determinants of Health   Financial Resource Strain: Low Risk  (02/21/2022)   Overall Financial Resource Strain (CARDIA)    Difficulty of Paying Living Expenses: Not hard at all  Food Insecurity: No Food Insecurity (02/21/2022)   Hunger Vital Sign    Worried About Running Out of Food in the Last Year: Never true    Ran Out of Food in the Last Year: Never true  Transportation Needs: No Transportation Needs (02/21/2022)   PRAPARE - Administrator, Civil Service (Medical): No    Lack of Transportation (Non-Medical): No  Physical Activity: Insufficiently Active (02/21/2022)   Exercise Vital Sign    Days of Exercise per Week: 3 days    Minutes of Exercise per Session: 40 min  Stress: No Stress Concern Present (02/21/2022)   [employer] of Occupational Health -  Occupational Stress Questionnaire    Feeling of Stress : Not at all  Social Connections: Moderately Integrated (02/21/2022)   Social Connection and Isolation Panel [NHANES]    Frequency of Communication with Friends and Family: More than three times a week    Frequency of Social Gatherings with Friends and Family: More than three times a week    Attends Religious Services: More than 4 times per year    Active Member of [employer] or Organizations: Yes    Attends Banker Meetings: More than 4 times per year    Marital Status: Never married  Intimate Partner Violence: Not At Risk (02/21/2022)   Humiliation, Afraid, Rape, and Kick questionnaire    Fear of Current or Ex-Partner: No    Emotionally Abused: No    Physically Abused: No    Sexually Abused: No    Review of Systems: See  HPI, otherwise negative ROS  Physical Exam: BP 119/81   Pulse 68   Temp (!) 96.3 F (35.7 C) (Temporal)   Resp 20   Ht 6' (1.829 m)   Wt 82.6 kg   SpO2 100%   BMI 24.68 kg/m  General:   Alert,  pleasant and cooperative in NAD Head:  Normocephalic and atraumatic. Neck:  Supple; no masses or thyromegaly. Lungs:  Clear throughout to auscultation.    Heart:  Regular rate and rhythm. Abdomen:  Soft, nontender and nondistended. Normal bowel sounds, without guarding, and without rebound.   Neurologic:  Alert and  oriented x4;  grossly normal neurologically.  Impression/Plan: Joseph Strong is here for an colonoscopy to be performed for colon cancer screening  Risks, benefits, limitations, and alternatives regarding  colonoscopy have been reviewed with the patient.  Questions have been answered.  All parties agreeable.   Joseph Donath, MD  05/10/2022, 9:35 AM

## 2022-05-10 NOTE — Anesthesia Preprocedure Evaluation (Signed)
Anesthesia Evaluation  Patient identified by MRN, date of birth, ID band Patient awake    Reviewed: Allergy & Precautions, NPO status , Patient's Chart, lab work & pertinent test results  History of Anesthesia Complications Negative for: history of anesthetic complications  Airway Mallampati: III  TM Distance: >3 FB Neck ROM: full    Dental  (+) Chipped   Pulmonary neg shortness of breath, Current Smoker,    Pulmonary exam normal        Cardiovascular Exercise Tolerance: Good Normal cardiovascular exam+ dysrhythmias      Neuro/Psych  Headaches, negative psych ROS   GI/Hepatic negative GI ROS, Neg liver ROS,   Endo/Other  negative endocrine ROS  Renal/GU negative Renal ROS  negative genitourinary   Musculoskeletal   Abdominal   Peds  Hematology negative hematology ROS (+)   Anesthesia Other Findings Past Medical History: 01-10-16: Burning with urination No date: Constipation No date: Dysrhythmia     Comment:  "OCCASSIONAL SPEEDING UP OF HEART RATE" PT THINKING IT               MAY BE DUE TO HERNIA No date: Enlarged prostate No date: Headache     Comment:  MIGRAINES No date: Inguinal hernia     Comment:  left   Past Surgical History: 1998: HERNIA REPAIR; Left     Comment:  AND UMBILICAL No date: HERNIA REPAIR     Comment:  51 years of age 62/16/2017: INGUINAL HERNIA REPAIR; Right     Comment:  Procedure: HERNIA REPAIR INGUINAL ADULT;  Surgeon:               Kieth Brightly, MD;  Location: [facility] ORS;  Service:              General;  Laterality: Right; No date: TONSILLECTOMY     Comment:  childhood     Reproductive/Obstetrics negative OB ROS                             Anesthesia Physical Anesthesia Plan  ASA: 2  Anesthesia Plan: General   Post-op Pain Management:    Induction: Intravenous  PONV Risk Score and Plan: Propofol infusion and TIVA  Airway Management  Planned: Natural Airway and Nasal Cannula  Additional Equipment:   Intra-op Plan:   Post-operative Plan:   Informed Consent: I have reviewed the patients History and Physical, chart, labs and discussed the procedure including the risks, benefits and alternatives for the proposed anesthesia with the patient or authorized representative who has indicated his/her understanding and acceptance.     Dental Advisory Given  Plan Discussed with: Anesthesiologist, CRNA and Surgeon  Anesthesia Plan Comments: (Patient consented for risks of anesthesia including but not limited to:  - adverse reactions to medications - risk of airway placement if required - damage to eyes, teeth, lips or other oral mucosa - nerve damage due to positioning  - sore throat or hoarseness - Damage to heart, brain, nerves, lungs, other parts of body or loss of life  Patient voiced understanding.)        Anesthesia Quick Evaluation

## 2022-05-10 NOTE — Op Note (Signed)
[facility] Gastroenterology Patient Name: Joseph Strong Procedure Date: 05/10/2022 9:40 AM MRN: [id number] Account #: [id number] Date of Birth: [birth year] Admit Type: Outpatient Age: 51 Room: [facility] ENDO ROOM 2 Gender: Male Note Status: Finalized Instrument Name: Prentice Docker [id number] Procedure:             Colonoscopy Indications:           Screening for colorectal malignant neoplasm, This is                         the patient's first colonoscopy Providers:             Toney Reil MD, MD Referring MD:          Danelle Berry (Referring MD) Medicines:             General Anesthesia Complications:         No immediate complications. Estimated blood loss: None. Procedure:             Pre-Anesthesia Assessment:                        - Prior to the procedure, a History and Physical was                         performed, and patient medications and allergies were                         reviewed. The patient is competent. The risks and                         benefits of the procedure and the sedation options and                         risks were discussed with the patient. All questions                         were answered and informed consent was obtained.                         Patient identification and proposed procedure were                         verified by the physician, the nurse, the                         anesthesiologist, the anesthetist and the technician                         in the pre-procedure area in the procedure room in the                         endoscopy suite. Mental Status Examination: alert and                         oriented. Airway Examination: normal oropharyngeal                         airway and neck mobility. Respiratory Examination:  clear to auscultation. CV Examination: normal.                         Prophylactic Antibiotics: The patient does not require                         prophylactic  antibiotics. Prior Anticoagulants: The                         patient has taken no previous anticoagulant or                         antiplatelet agents. ASA Grade Assessment: II - A                         patient with mild systemic disease. After reviewing                         the risks and benefits, the patient was deemed in                         satisfactory condition to undergo the procedure. The                         anesthesia plan was to use general anesthesia.                         Immediately prior to administration of medications,                         the patient was re-assessed for adequacy to receive                         sedatives. The heart rate, respiratory rate, oxygen                         saturations, blood pressure, adequacy of pulmonary                         ventilation, and response to care were monitored                         throughout the procedure. The physical status of the                         patient was re-assessed after the procedure.                        After obtaining informed consent, the colonoscope was                         passed under direct vision. Throughout the procedure,                         the patient's blood pressure, pulse, and oxygen                         saturations were monitored continuously. The  Colonoscope was introduced through the anus and                         advanced to the the cecum, identified by appendiceal                         orifice and ileocecal valve. The colonoscopy was                         performed without difficulty. The patient tolerated                         the procedure well. The quality of the bowel                         preparation was evaluated using the BBPS [facility] Bowel                         Preparation Scale) with scores of: Right Colon = 3,                         Transverse Colon = 3 and Left Colon = 3 (entire mucosa                          seen well with no residual staining, small fragments                         of stool or opaque liquid). The total BBPS score                         equals 9. Findings:      The perianal and digital rectal examinations were normal. Pertinent       negatives include normal sphincter tone and no palpable rectal lesions.      The entire examined colon appeared normal.      The retroflexed view of the distal rectum and anal verge was normal and       showed no anal or rectal abnormalities. Impression:            - The entire examined colon is normal.                        - The distal rectum and anal verge are normal on                         retroflexion view.                        - No specimens collected. Recommendation:        - Discharge patient to home (with escort).                        - Resume previous diet today.                        - Continue present medications.                        - Repeat colonoscopy in 10 years  for screening                         purposes. Procedure Code(s):     --- Professional ---                        B7628, Colorectal cancer screening; colonoscopy on                         individual not meeting criteria for high risk Diagnosis Code(s):     --- Professional ---                        Z12.11, Encounter for screening for malignant neoplasm                         of colon CPT copyright 2019 American Medical Association. All rights reserved. The codes documented in this report are preliminary and upon coder review may  be revised to meet current compliance requirements. Dr. Libby Maw Toney Reil MD, MD 05/10/2022 10:26:00 AM This report has been signed electronically. Number of Addenda: 0 Note Initiated On: 05/10/2022 9:40 AM Scope Withdrawal Time: 0 hours 12 minutes 36 seconds  Total Procedure Duration: 0 hours 16 minutes 40 seconds  Estimated Blood Loss:  Estimated blood loss: none.      [facility]

## 2022-05-11 NOTE — Progress Notes (Signed)
Voicemail that is not set up for messages; so no Message Left.

## 2022-05-13 ENCOUNTER — Encounter: Payer: Self-pay | Admitting: Gastroenterology

## 2022-06-17 ENCOUNTER — Ambulatory Visit (INDEPENDENT_AMBULATORY_CARE_PROVIDER_SITE_OTHER): Payer: 59 | Admitting: Surgery

## 2022-06-17 ENCOUNTER — Encounter: Payer: Self-pay | Admitting: Surgery

## 2022-06-17 VITALS — BP 106/78 | HR 92 | Temp 98.2°F | Ht 72.0 in | Wt 179.0 lb

## 2022-06-17 DIAGNOSIS — K420 Umbilical hernia with obstruction, without gangrene: Secondary | ICD-10-CM | POA: Diagnosis not present

## 2022-06-17 DIAGNOSIS — K4091 Unilateral inguinal hernia, without obstruction or gangrene, recurrent: Secondary | ICD-10-CM | POA: Diagnosis not present

## 2022-06-17 NOTE — Progress Notes (Signed)
06/17/2022  History of Present Illness: Joseph Strong is a 51 y.o. male presenting for follow-up of recurrent umbilical hernia as well as a recurrent right inguinal hernia.  The patient was last seen on 03/25/2022 and at that point he wanted to wait until the summer for the surgery.  Today he reports that he is noticing increased discomfort at the umbilical recurrence site and also continues having discomfort in the right inguinal area.  Reports also some discomfort in the left inguinal region, but denies any bulging and this appears to be chronic for a long time already.  His last CT scan there was no evidence of a left inguinal hernia.  Denies any nausea, vomiting, fevers, chills, chest pain, shortness of breath.  Reports that the at the umbilical area, he is noticing more pulling sensation radiating towards the left and upper areas.  At the groin area on the right side, he notices more burning sensation.  Past Medical History: Past Medical History:  Diagnosis Date   Burning with urination 01-10-16   Constipation    Dysrhythmia    "OCCASSIONAL SPEEDING UP OF HEART RATE" PT THINKING IT MAY BE DUE TO HERNIA   Enlarged prostate    Headache    MIGRAINES   Inguinal hernia    left      Past Surgical History: Past Surgical History:  Procedure Laterality Date   COLONOSCOPY WITH PROPOFOL N/A 05/10/2022   Procedure: COLONOSCOPY WITH PROPOFOL;  Surgeon: Toney Reil, MD;  Location: [facility] ENDOSCOPY;  Service: Gastroenterology;  Laterality: N/A;   HERNIA REPAIR Left 1998   AND UMBILICAL   HERNIA REPAIR     51 years of age   INGUINAL HERNIA REPAIR Right 01/11/2016   Procedure: HERNIA REPAIR INGUINAL ADULT;  Surgeon: Kieth Brightly, MD;  Location: [facility] ORS;  Service: General;  Laterality: Right;   TONSILLECTOMY     childhood    Home Medications: Prior to Admission medications   Medication Sig Start Date End Date Taking? Authorizing Provider  acetaminophen (TYLENOL) 500 MG tablet Take  500 mg by mouth every 6 (six) hours as needed for mild pain.   Yes [provider]  Multiple Vitamin (MULTIVITAMIN) tablet Take 1 tablet by mouth daily.   Yes [provider]  tadalafil (CIALIS) 5 MG tablet TAKE ONE TABLET BY MOUTH DAILY AS NEEDED FOR ERECTILE DYSFUNCTION 12/07/21  Yes McGowan, Carollee Herter A, PA-C    Allergies: No Known Allergies  Review of Systems: Review of Systems  Constitutional:  Negative for chills and fever.  HENT:  Negative for hearing loss.   Respiratory:  Negative for shortness of breath.   Cardiovascular:  Negative for chest pain.  Gastrointestinal:  Positive for abdominal pain. Negative for constipation, diarrhea, nausea and vomiting.  Genitourinary:  Negative for dysuria.  Musculoskeletal:  Negative for myalgias.  Skin:  Negative for rash.  Neurological:  Negative for dizziness.  Psychiatric/Behavioral:  Negative for depression.     Physical Exam BP 106/78   Pulse 92   Temp 98.2 F (36.8 C)   Ht 6' (1.829 m)   Wt 179 lb (81.2 kg)   SpO2 98%   BMI 24.28 kg/m  CONSTITUTIONAL: No acute distress, well-nourished HEENT:  Normocephalic, atraumatic, extraocular motion intact. NECK: Trachea is midline, no jugular venous distention. RESPIRATORY:  Lungs are clear, and breath sounds are equal bilaterally. Normal respiratory effort without pathologic use of accessory muscles. CARDIOVASCULAR: Heart is regular without murmurs, gallops, or rubs. GI: The abdomen is soft, nondistended,  with some discomfort at the umbilicus at the area of his hernia recurrence.  The hernia is incarcerated but appears to be fat-containing only.  On the right groin, he does have a small recurrent right inguinal hernia towards the external ring.  On the left side, no evidence of recurrence.  Otherwise all incisions are well-healed.  MUSCULOSKELETAL: No peripheral edema, normal gait NEUROLOGIC:  Motor and sensation is grossly normal.  Cranial nerves are grossly  intact. PSYCH:  Alert and oriented to person, place and time. Affect is normal.  Labs/Imaging: Labs from 02/21/2022: Sodium 137, potassium 4.1, chloride 105, CO2 23, BUN 17, creatinine 1.22.  LFTs within normal.  WBC 5.9, hemoglobin 15.7, hematocrit 47.9, platelets 244.   Assessment and Plan: This is a 51 y.o. male with a recurrent umbilical hernia and recurrent right inguinal hernia.  - The patient feels that now it is time to deal with the 2 hernias and repair them.  He is in a better situation now and ready for surgery.  Discussed with him a plan for robotic assisted right inguinal hernia repair with an open recurrent umbilical hernia repair.  Discussed with him that during the surgery, we will also evaluate the left groin to see if that also needs repair or not.  Reviewed with him the surgery at length including the risks of bleeding, infection, injury to surrounding structures, that this would be an outpatient procedure, the use of mesh, postoperative activity restrictions, pain control, and he is willing to proceed. - We will schedule the surgery for 07/02/2022.  Patient has presented today with FMLA paperwork, will complete this and return promptly.  I spent 40 minutes dedicated to the care of this patient on the date of this encounter to include pre-visit review of records, face-to-face time with the patient discussing diagnosis and management, and any post-visit coordination of care.   [name], MD [facility]

## 2022-06-17 NOTE — Patient Instructions (Addendum)
You have requested for your Umbilical and Inguinal Hernias be repaired. This will be scheduled with Dr. Aleen Campi at [facility].  Please see your (blue)pre-care sheet for information. Our surgery scheduler will call you to verify surgery date and to go over information.   You will need to arrange to be off work for 1-2 weeks but will have to have a lifting restriction of no more than 15 lbs for 6 weeks following your surgery. If you have FMLA or disability paperwork that needs filled out you may drop this off at our office or this can be faxed to [phone].  You may have a bruise in your groin and also swelling and brusing in your testicle area. You may use ice 4-5 times daily for 15-20 minutes each time. Make sure that you place a barrier between you and the ice pack. To decrease the swelling, you may roll up a bath towel and place it vertically in between your thighs with your testicles resting on the towel. You will want to keep this area elevated as much as possible for several days following surgery.    Inguinal Hernia, Adult Muscles help keep everything in the body in its proper place. But if a weak spot in the muscles develops, something can poke through. That is called a hernia. When this happens in the lower part of the belly (abdomen), it is called an inguinal hernia. (It takes its name from a part of the body in this region called the inguinal canal.) A weak spot in the wall of muscles lets some fat or part of the small intestine bulge through. An inguinal hernia can develop at any age. Men get them more often than women. CAUSES  In adults, an inguinal hernia develops over time. It can be triggered by: Suddenly straining the muscles of the lower abdomen. Lifting heavy objects. Straining to have a bowel movement. Difficult bowel movements (constipation) can lead to this. Constant coughing. This may be caused by smoking or lung disease. Being overweight. Being  pregnant. Working at a job that requires long periods of standing or heavy lifting. Having had an inguinal hernia before. One type can be an emergency situation. It is called a strangulated inguinal hernia. It develops if part of the small intestine slips through the weak spot and cannot get back into the abdomen. The blood supply can be cut off. If that happens, part of the intestine may die. This situation requires emergency surgery. SYMPTOMS  Often, a small inguinal hernia has no symptoms. It is found when a healthcare provider does a physical exam. Larger hernias usually have symptoms.  In adults, symptoms may include: A lump in the groin. This is easier to see when the person is standing. It might disappear when lying down. In men, a lump in the scrotum. Pain or burning in the groin. This occurs especially when lifting, straining or coughing. A dull ache or feeling of pressure in the groin. Signs of a strangulated hernia can include: A bulge in the groin that becomes very painful and tender to the touch. A bulge that turns red or purple. Fever, nausea and vomiting. Inability to have a bowel movement or to pass gas. DIAGNOSIS  To decide if you have an inguinal hernia, a healthcare provider will probably do a physical examination. This will include asking questions about any symptoms you have noticed. The healthcare provider might feel the groin area and ask you to cough. If an inguinal hernia is felt, the  healthcare provider may try to slide it back into the abdomen. Usually no other tests are needed. TREATMENT  Treatments can vary. The size of the hernia makes a difference. Options include: Watchful waiting. This is often suggested if the hernia is small and you have had no symptoms. No medical procedure will be done unless symptoms develop. You will need to watch closely for symptoms. If any occur, contact your healthcare provider right away. Surgery. This is used if the hernia is  larger or you have symptoms. Open surgery. This is usually an outpatient procedure (you will not stay overnight in a hospital). An cut (incision) is made through the skin in the groin. The hernia is put back inside the abdomen. The weak area in the muscles is then repaired by herniorrhaphy or hernioplasty. Herniorrhaphy: in this type of surgery, the weak muscles are sewn back together. Hernioplasty: a patch or mesh is used to close the weak area in the abdominal wall. Laparoscopy. In this procedure, a surgeon makes small incisions. A thin tube with a tiny video camera (called a laparoscope) is put into the abdomen. The surgeon repairs the hernia with mesh by looking with the video camera and using two long instruments. HOME CARE INSTRUCTIONS  After surgery to repair an inguinal hernia: You will need to take pain medicine prescribed by your healthcare provider. Follow all directions carefully. You will need to take care of the wound from the incision. Your activity will be restricted for awhile. This will probably include no heavy lifting for several weeks. You also should not do anything too active for a few weeks. When you can return to work will depend on the type of job that you have. During "watchful waiting" periods, you should: Maintain a healthy weight. Eat a diet high in fiber (fruits, vegetables and whole grains). Drink plenty of fluids to avoid constipation. This means drinking enough water and other liquids to keep your urine clear or pale yellow. Do not lift heavy objects. Do not stand for long periods of time. Quit smoking. This should keep you from developing a frequent cough. SEEK MEDICAL CARE IF:  A bulge develops in your groin area. You feel pain, a burning sensation or pressure in the groin. This might be worse if you are lifting or straining. You develop a fever of more than 100.5 F (38.1 C). SEEK IMMEDIATE MEDICAL CARE IF:  Pain in the groin increases suddenly. A bulge in  the groin gets bigger suddenly and does not go down. For men, there is sudden pain in the scrotum. Or, the size of the scrotum increases. A bulge in the groin area becomes red or purple and is painful to touch. You have nausea or vomiting that does not go away. You feel your heart beating much faster than normal. You cannot have a bowel movement or pass gas. You develop a fever of more than 102.0 F (38.9 C).    Umbilical Hernia, Adult   A hernia is a bulge of tissue that pushes through an opening between muscles. An umbilical hernia happens in the abdomen, near the belly button (umbilicus). The hernia may contain tissues from the small intestine, large intestine, or fatty tissue covering the intestines (omentum). Umbilical hernias in adults tend to get worse over time, and they require surgical treatment. There are several types of umbilical hernias. You may have: A hernia located just above or below the umbilicus (indirect hernia). This is the most common type of umbilical hernia in adults.  A hernia that forms through an opening formed by the umbilicus (direct hernia). A hernia that comes and goes (reducible hernia). A reducible hernia may be visible only when you strain, lift something heavy, or cough. This type of hernia can be pushed back into the abdomen (reduced). A hernia that traps abdominal tissue inside the hernia (incarcerated hernia). This type of hernia cannot be reduced. A hernia that cuts off blood flow to the tissues inside the hernia (strangulated hernia). The tissues can start to die if this happens. This type of hernia requires emergency treatment.  What are the causes? An umbilical hernia happens when tissue inside the abdomen presses on a weak area of the abdominal muscles. What increases the risk? You may have a greater risk of this condition if you: Are obese. Have had several pregnancies. Have a buildup of fluid inside your abdomen (ascites). Have had surgery that  weakens the abdominal muscles.  What are the signs or symptoms? The main symptom of this condition is a painless bulge at or near the belly button. A reducible hernia may be visible only when you strain, lift something heavy, or cough. Other symptoms may include: Dull pain. A feeling of pressure.  Symptoms of a strangulated hernia may include: Pain that gets increasingly worse. Nausea and vomiting. Pain when pressing on the hernia. Skin over the hernia becoming red or purple. Constipation. Blood in the stool.  How is this diagnosed? This condition may be diagnosed based on: A physical exam. You may be asked to cough or strain while standing. These actions increase the pressure inside your abdomen and force the hernia through the opening in your muscles. Your health care provider may try to reduce the hernia by pressing on it. Your symptoms and medical history.  How is this treated? Surgery is the only treatment for an umbilical hernia. Surgery for a strangulated hernia is done as soon as possible. If you have a small hernia that is not incarcerated, you may need to lose weight before having surgery. Follow these instructions at home: Lose weight, if told by your health care provider. Do not try to push the hernia back in. Watch your hernia for any changes in color or size. Tell your health care provider if any changes occur. You may need to avoid activities that increase pressure on your hernia. Do not lift anything that is heavier than 10 lb (4.5 kg) until your health care provider says that this is safe. Take over-the-counter and prescription medicines only as told by your health care provider. Keep all follow-up visits as told by your health care provider. This is important. Contact a health care provider if: Your hernia gets larger. Your hernia becomes painful. Get help right away if: You develop sudden, severe pain near the area of your hernia. You have pain as well as nausea  or vomiting. You have pain and the skin over your hernia changes color. You develop a fever.

## 2022-06-17 NOTE — H&P (View-Only) (Signed)
06/17/2022  History of Present Illness: Joseph Strong is a 51 y.o. male presenting for follow-up of recurrent umbilical hernia as well as a recurrent right inguinal hernia.  The patient was last seen on 03/25/2022 and at that point he wanted to wait until the summer for the surgery.  Today he reports that he is noticing increased discomfort at the umbilical recurrence site and also continues having discomfort in the right inguinal area.  Reports also some discomfort in the left inguinal region, but denies any bulging and this appears to be chronic for a long time already.  His last CT scan there was no evidence of a left inguinal hernia.  Denies any nausea, vomiting, fevers, chills, chest pain, shortness of breath.  Reports that the at the umbilical area, he is noticing more pulling sensation radiating towards the left and upper areas.  At the groin area on the right side, he notices more burning sensation.  Past Medical History: Past Medical History:  Diagnosis Date   Burning with urination 01-10-16   Constipation    Dysrhythmia    "OCCASSIONAL SPEEDING UP OF HEART RATE" PT THINKING IT MAY BE DUE TO HERNIA   Enlarged prostate    Headache    MIGRAINES   Inguinal hernia    left      Past Surgical History: Past Surgical History:  Procedure Laterality Date   COLONOSCOPY WITH PROPOFOL N/A 05/10/2022   Procedure: COLONOSCOPY WITH PROPOFOL;  Surgeon: Toney Reil, MD;  Location: [facility] END[facility]COPY;  Service: Gastroenterology;  Laterality: N/A;   HERNIA REPAIR Left 1998   AND UMBILICAL   HERNIA REPAIR     51 years of age   INGUINAL HERNIA REPAIR Right 01/11/2016   Procedure: HERNIA REPAIR INGUINAL ADULT;  Surgeon: Kieth Brightly, MD;  Location: [facility] ORS;  Service: General;  Laterality: Right;   TONSILLECTOMY     childhood    Home Medications: Prior to Admission medications   Medication Sig Start Date End Date Taking? Authorizing Provider  acetaminophen (TYLENOL) 500 MG tablet Take  500 mg by mouth every 6 (six) hours as needed for mild pain.   Yes [provider]  Multiple Vitamin (MULTIVITAMIN) tablet Take 1 tablet by mouth daily.   Yes [provider]  tadalafil (CIALIS) 5 MG tablet TAKE ONE TABLET BY MOUTH DAILY AS NEEDED FOR ERECTILE DYSFUNCTION 12/07/21  Yes McGowan, Carollee Herter A, PA-C    Allergies: No Known Allergies  Review of Systems: Review of Systems  Constitutional:  Negative for chills and fever.  HENT:  Negative for hearing loss.   Respiratory:  Negative for shortness of breath.   Cardiovascular:  Negative for chest pain.  Gastrointestinal:  Positive for abdominal pain. Negative for constipation, diarrhea, nausea and vomiting.  Genitourinary:  Negative for dysuria.  Musculoskeletal:  Negative for myalgias.  Skin:  Negative for rash.  Neurological:  Negative for dizziness.  Psychiatric/Behavioral:  Negative for depression.     Physical Exam BP 106/78   Pulse 92   Temp 98.2 F (36.8 C)   Ht 6' (1.829 m)   Wt 179 lb (81.2 kg)   SpO2 98%   BMI 24.28 kg/m  CONSTITUTIONAL: No acute distress, well-nourished HEENT:  Normocephalic, atraumatic, extraocular motion intact. NECK: Trachea is midline, no jugular venous distention. RESPIRATORY:  Lungs are clear, and breath sounds are equal bilaterally. Normal respiratory effort without pathologic use of accessory muscles. CARDIOVASCULAR: Heart is regular without murmurs, gallops, or rubs. GI: The abdomen is soft, nondistended,  with some discomfort at the umbilicus at the area of his hernia recurrence.  The hernia is incarcerated but appears to be fat-containing only.  On the right groin, he does have a small recurrent right inguinal hernia towards the external ring.  On the left side, no evidence of recurrence.  Otherwise all incisions are well-healed.  MUSCULOSKELETAL: No peripheral edema, normal gait NEUROLOGIC:  Motor and sensation is grossly normal.  Cranial nerves are grossly  intact. PSYCH:  Alert and oriented to person, place and time. Affect is normal.  Labs/Imaging: Labs from 02/21/2022: Sodium 137, potassium 4.1, chloride 105, CO2 23, BUN 17, creatinine 1.22.  LFTs within normal.  WBC 5.9, hemoglobin 15.7, hematocrit 47.9, platelets 244.   Assessment and Plan: This is a 50 y.o. male with a recurrent umbilical hernia and recurrent right inguinal hernia.  - The patient feels that now it is time to deal with the 2 hernias and repair them.  He is in a better situation now and ready for surgery.  Discussed with him a plan for robotic assisted right inguinal hernia repair with an open recurrent umbilical hernia repair.  Discussed with him that during the surgery, we will also evaluate the left groin to see if that also needs repair or not.  Reviewed with him the surgery at length including the risks of bleeding, infection, injury to surrounding structures, that this would be an outpatient procedure, the use of mesh, postoperative activity restrictions, pain control, and he is willing to proceed. - We will schedule the surgery for 07/02/2022.  Patient has presented today with FMLA paperwork, will complete this and return promptly.  I spent 40 minutes dedicated to the care of this patient on the date of this encounter to include pre-visit review of records, face-to-face time with the patient discussing diagnosis and management, and any post-visit coordination of care.   Jaleisa Brose Luis Adyen Bifulco, MD [facility]     

## 2022-06-18 ENCOUNTER — Telehealth: Payer: Self-pay | Admitting: Surgery

## 2022-06-18 NOTE — Telephone Encounter (Signed)
Patient called back and surgery information was reviewed with him.  

## 2022-06-18 NOTE — Telephone Encounter (Signed)
Outgoing call, unable to leave message, no voice mail.  If patient calls, please inform him of the following scheduled surgery:   Surgery Date: 07/02/22 Preadmission Testing Date: 06/27/22 (phone 1p-5p)  Also patient will need to call at [phone], between 1-3:00pm the day before surgery, to find out what time to arrive for surgery.

## 2022-06-26 ENCOUNTER — Telehealth: Payer: Self-pay | Admitting: *Deleted

## 2022-06-26 NOTE — Telephone Encounter (Signed)
Faxed FMLA to [employer]- [place] Dace [phone]

## 2022-06-27 ENCOUNTER — Encounter
Admission: RE | Admit: 2022-06-27 | Discharge: 2022-06-27 | Disposition: A | Discharge status: Home / Self Care | Payer: 59 | Source: Ambulatory Visit | Attending: Surgery | Admitting: Surgery

## 2022-06-27 ENCOUNTER — Inpatient Hospital Stay
Admission: RE | Admit: 2022-06-27 | Discharge: 2022-06-27 | Disposition: A | Discharge status: Home / Self Care | Payer: 59 | Source: Ambulatory Visit

## 2022-06-27 HISTORY — DX: Scoliosis, unspecified: M41.9

## 2022-06-27 HISTORY — DX: Benign prostatic hyperplasia without lower urinary tract symptoms: N40.0

## 2022-06-27 NOTE — Patient Instructions (Addendum)
Your procedure is scheduled on: Tuesday, August 8 Report to the Registration Desk on the 1st floor of the [facility] To find out your arrival time, please call [phone] between 1PM - 3PM on: Monday, August 7 If your arrival time is 6:00 am, do not arrive prior to that time as the [facility] entrance doors do not open until 6:00 am.  REMEMBER: Instructions that are not followed completely may result in serious medical risk, up to and including death; or upon the discretion of your surgeon and anesthesiologist your surgery may need to be rescheduled.  Do not eat food after midnight the night before surgery.  No gum chewing, lozengers or hard candies.  You may however, drink CLEAR liquids up to 2 hours before you are scheduled to arrive for your surgery. Do not drink anything within 2 hours of your scheduled arrival time.  Clear liquids include: - water  - apple juice without pulp - gatorade (not RED colors) - black coffee or tea (Do NOT add milk or creamers to the coffee or tea) Do NOT drink anything that is not on this list.  DO NOT TAKE ANY MEDICATIONS THE MORNING OF SURGERY   One week prior to surgery: Starting today, August 3 Stop Anti-inflammatories (NSAIDS) such as Advil, Aleve, Ibuprofen, Motrin, Naproxen, Naprosyn and Aspirin based products such as Excedrin, Goodys Powder, BC Powder. Stop ANY OVER THE COUNTER supplements until after surgery. Stop multiple vitamins. You may however, continue to take Tylenol if needed for pain up until the day of surgery.  No Alcohol for 24 hours before or after surgery.  No Smoking including e-cigarettes for 24 hours prior to surgery.  No chewable tobacco products for at least 6 hours prior to surgery.  No nicotine patches on the day of surgery.  Do not use any "recreational" drugs for at least a week prior to your surgery.  Please be advised that the combination of cocaine and anesthesia may have negative outcomes, up to and  including death. If you test positive for cocaine, your surgery will be cancelled.  On the morning of surgery brush your teeth with toothpaste and water, you may rinse your mouth with mouthwash if you wish. Do not swallow any toothpaste or mouthwash.  Use CHG Soap as directed on instruction sheet.  Do not wear jewelry, make-up, hairpins, clips or nail polish.  Do not wear lotions, powders, or perfumes.   Do not shave body from the neck down 48 hours prior to surgery just in case you cut yourself which could leave a site for infection.  Also, freshly shaved skin may become irritated if using the CHG soap.  Contact lenses, hearing aids and dentures may not be worn into surgery.  Do not bring valuables to the hospital. [facility] is not responsible for any missing/lost belongings or valuables.   Notify your doctor if there is any change in your medical condition (cold, fever, infection).  Wear comfortable clothing (specific to your surgery type) to the hospital.  After surgery, you can help prevent lung complications by doing breathing exercises.  Take deep breaths and cough every 1-2 hours. Your doctor may order a device called an Incentive Spirometer to help you take deep breaths. When coughing or sneezing, hold a pillow firmly against your incision with both hands. This is called "splinting." Doing this helps protect your incision. It also decreases belly discomfort.  If you are being discharged the day of surgery, you will not be allowed to  drive home. You will need a responsible adult (18 years or older) to drive you home and stay with you that night.   If you are taking public transportation, you will need to have a responsible adult (18 years or older) with you. Please confirm with your physician that it is acceptable to use public transportation.   Please call the Pre-admissions Testing Dept. at [phone] if you have any questions about these instructions.  Surgery  Visitation Policy:  Patients undergoing a surgery or procedure may have two family members or support persons with them as long as the person is not COVID-19 positive or experiencing its symptoms.   Preparing for Surgery with CHLORHEXIDINE GLUCONATE (CHG) Soap    Before surgery, you can play an important role by reducing the number of germs on your skin.  CHG (Chlorhexidine gluconate) soap is an antiseptic cleanser which kills germs and bonds with the skin to continue killing germs even after washing.  Please do not use if you have an allergy to CHG or antibacterial soaps. If your skin becomes reddened/irritated stop using the CHG.  1. Shower the NIGHT BEFORE SURGERY and the MORNING OF SURGERY with CHG soap.  2. If you choose to wash your hair, wash your hair first as usual with your normal shampoo.  3. After shampooing, rinse your hair and body thoroughly to remove the shampoo.  4. Use CHG as you would any other liquid soap. You can apply CHG directly to the skin and wash gently with a scrungie or a clean washcloth.  5. Apply the CHG soap to your body only from the neck down. Do not use on open wounds or open sores. Avoid contact with your eyes, ears, mouth, and genitals (private parts). Wash face and genitals (private parts) with your normal soap.  6. Wash thoroughly, paying special attention to the area where your surgery will be performed.  7. Thoroughly rinse your body with warm water.  8. Do not shower/wash with your normal soap after using and rinsing off the CHG soap.  9. Pat yourself dry with a clean towel.  10. Wear clean pajamas to bed the night before surgery.  12. Place clean sheets on your bed the night of your first shower and do not sleep with pets.  13. Shower again with the CHG soap on the day of surgery prior to arriving at the hospital.  14. Do not apply any deodorants/lotions/powders.  15. Please wear clean clothes to the hospital.

## 2022-06-28 ENCOUNTER — Inpatient Hospital Stay: Admission: RE | Admit: 2022-06-28 | Payer: 59 | Source: Ambulatory Visit

## 2022-07-01 ENCOUNTER — Telehealth: Payer: Self-pay | Admitting: *Deleted

## 2022-07-01 NOTE — Telephone Encounter (Signed)
Faxed FMLA to [employer] at [phone] 

## 2022-07-02 ENCOUNTER — Ambulatory Visit: Payer: 59 | Admitting: Urgent Care

## 2022-07-02 ENCOUNTER — Encounter: Payer: Self-pay | Admitting: Surgery

## 2022-07-02 ENCOUNTER — Other Ambulatory Visit: Payer: Self-pay

## 2022-07-02 ENCOUNTER — Ambulatory Visit
Admission: RE | Admit: 2022-07-02 | Discharge: 2022-07-02 | Disposition: A | Discharge status: Home / Self Care | Payer: 59 | Attending: Surgery | Admitting: Surgery

## 2022-07-02 ENCOUNTER — Encounter
Admission: RE | Disposition: A | Discharge status: Home / Self Care | Payer: Self-pay | Source: Home / Self Care | Attending: Surgery

## 2022-07-02 DIAGNOSIS — K420 Umbilical hernia with obstruction, without gangrene: Secondary | ICD-10-CM | POA: Insufficient documentation

## 2022-07-02 DIAGNOSIS — K4091 Unilateral inguinal hernia, without obstruction or gangrene, recurrent: Secondary | ICD-10-CM

## 2022-07-02 DIAGNOSIS — K4021 Bilateral inguinal hernia, without obstruction or gangrene, recurrent: Secondary | ICD-10-CM

## 2022-07-02 HISTORY — PX: UMBILICAL HERNIA REPAIR: SHX196

## 2022-07-02 HISTORY — PX: INSERTION OF MESH: SHX5868

## 2022-07-02 HISTORY — PX: XI ROBOTIC ASSISTED INGUINAL HERNIA REPAIR WITH MESH: SHX6706

## 2022-07-02 SURGERY — REPAIR, HERNIA, INGUINAL, ROBOT-ASSISTED, LAPAROSCOPIC, USING MESH
Anesthesia: General | Site: Abdomen

## 2022-07-02 MED ORDER — LACTATED RINGERS IV SOLN: INTRAVENOUS | Status: DC | PRN

## 2022-07-02 MED ORDER — ONDANSETRON HCL 4 MG/2ML IJ SOLN
INTRAMUSCULAR | Status: DC | PRN
  Administered 2022-07-02: 4 mg via INTRAVENOUS

## 2022-07-02 MED ORDER — FENTANYL CITRATE (PF) 100 MCG/2ML IJ SOLN
25.0000 ug | INTRAMUSCULAR | Status: DC | PRN
  Administered 2022-07-02: 50 ug via INTRAVENOUS
  Administered 2022-07-02 (×2): 25 ug via INTRAVENOUS

## 2022-07-02 MED ORDER — PHENYLEPHRINE HCL-NACL 20-0.9 MG/250ML-% IV SOLN
INTRAVENOUS | Status: DC | PRN
  Administered 2022-07-02: 40 ug/min via INTRAVENOUS

## 2022-07-02 MED ORDER — GLYCOPYRROLATE 0.2 MG/ML IJ SOLN
INTRAMUSCULAR | Status: DC | PRN
  Administered 2022-07-02: .2 mg via INTRAVENOUS

## 2022-07-02 MED ORDER — CHLORHEXIDINE GLUCONATE 0.12 % MT SOLN: 15.0000 mL | Freq: Once | OROMUCOSAL | Status: AC

## 2022-07-02 MED ORDER — HYDROMORPHONE HCL 1 MG/ML IJ SOLN
INTRAMUSCULAR | Status: DC | PRN
  Administered 2022-07-02: 1 mg via INTRAVENOUS

## 2022-07-02 MED ORDER — EPHEDRINE SULFATE (PRESSORS) 50 MG/ML IJ SOLN
INTRAMUSCULAR | Status: DC | PRN
  Administered 2022-07-02: 10 mg via INTRAVENOUS

## 2022-07-02 MED ORDER — FENTANYL CITRATE (PF) 100 MCG/2ML IJ SOLN
INTRAMUSCULAR | Status: AC
  Filled 2022-07-02: qty 2

## 2022-07-02 MED ORDER — BUPIVACAINE LIPOSOME 1.3 % IJ SUSP
INTRAMUSCULAR | Status: AC
Start: 2022-07-02 — End: ?
  Filled 2022-07-02: qty 20

## 2022-07-02 MED ORDER — ACETAMINOPHEN 500 MG PO TABS: 1000.0000 mg | ORAL_TABLET | ORAL | Status: AC

## 2022-07-02 MED ORDER — CHLORHEXIDINE GLUCONATE CLOTH 2 % EX PADS: 6.0000 | MEDICATED_PAD | Freq: Once | CUTANEOUS | Status: DC

## 2022-07-02 MED ORDER — ACETAMINOPHEN 500 MG PO TABS: 1000.0000 mg | ORAL_TABLET | Freq: Four times a day (QID) | ORAL | Status: DC | PRN

## 2022-07-02 MED ORDER — ACETAMINOPHEN 500 MG PO TABS
ORAL_TABLET | ORAL | Status: AC
  Administered 2022-07-02: 1000 mg via ORAL
  Filled 2022-07-02: qty 2

## 2022-07-02 MED ORDER — PROPOFOL 10 MG/ML IV BOLUS
INTRAVENOUS | Status: DC | PRN
  Administered 2022-07-02: 50 mg via INTRAVENOUS
  Administered 2022-07-02: 150 mg via INTRAVENOUS

## 2022-07-02 MED ORDER — MIDAZOLAM HCL 2 MG/2ML IJ SOLN
INTRAMUSCULAR | Status: DC | PRN
  Administered 2022-07-02: 2 mg via INTRAVENOUS

## 2022-07-02 MED ORDER — IBUPROFEN 800 MG PO TABS: 800.0000 mg | ORAL_TABLET | Freq: Three times a day (TID) | ORAL | 1 refills | Status: DC | PRN

## 2022-07-02 MED ORDER — GABAPENTIN 300 MG PO CAPS
ORAL_CAPSULE | ORAL | Status: AC
  Administered 2022-07-02: 300 mg via ORAL
  Filled 2022-07-02: qty 1

## 2022-07-02 MED ORDER — ROCURONIUM BROMIDE 100 MG/10ML IV SOLN
INTRAVENOUS | Status: DC | PRN
  Administered 2022-07-02: 20 mg via INTRAVENOUS
  Administered 2022-07-02: 80 mg via INTRAVENOUS

## 2022-07-02 MED ORDER — FAMOTIDINE 20 MG PO TABS: 20.0000 mg | ORAL_TABLET | Freq: Once | ORAL | Status: AC

## 2022-07-02 MED ORDER — BUPIVACAINE LIPOSOME 1.3 % IJ SUSP: 20.0000 mL | Freq: Once | INTRAMUSCULAR | Status: DC

## 2022-07-02 MED ORDER — OXYCODONE HCL 5 MG PO TABS
ORAL_TABLET | ORAL | Status: AC
  Filled 2022-07-02: qty 1

## 2022-07-02 MED ORDER — OXYCODONE HCL 5 MG PO TABS: 5.0000 mg | ORAL_TABLET | ORAL | 0 refills | Status: DC | PRN

## 2022-07-02 MED ORDER — ORAL CARE MOUTH RINSE: 15.0000 mL | Freq: Once | OROMUCOSAL | Status: AC

## 2022-07-02 MED ORDER — BUPIVACAINE-EPINEPHRINE (PF) 0.5% -1:200000 IJ SOLN
INTRAMUSCULAR | Status: AC
  Filled 2022-07-02: qty 30

## 2022-07-02 MED ORDER — CEFAZOLIN SODIUM-DEXTROSE 2-4 GM/100ML-% IV SOLN
2.0000 g | INTRAVENOUS | Status: AC
  Administered 2022-07-02: 2 g via INTRAVENOUS

## 2022-07-02 MED ORDER — CHLORHEXIDINE GLUCONATE 0.12 % MT SOLN
OROMUCOSAL | Status: AC
  Administered 2022-07-02: 15 mL via OROMUCOSAL
  Filled 2022-07-02: qty 15

## 2022-07-02 MED ORDER — PROPOFOL 1000 MG/100ML IV EMUL
INTRAVENOUS | Status: AC
  Filled 2022-07-02: qty 100

## 2022-07-02 MED ORDER — DEXAMETHASONE SODIUM PHOSPHATE 10 MG/ML IJ SOLN
INTRAMUSCULAR | Status: DC | PRN
  Administered 2022-07-02: 8 mg via INTRAVENOUS

## 2022-07-02 MED ORDER — FENTANYL CITRATE (PF) 100 MCG/2ML IJ SOLN
INTRAMUSCULAR | Status: DC | PRN
Start: 2022-07-02 — End: 2022-07-02
  Administered 2022-07-02 (×2): 50 ug via INTRAVENOUS

## 2022-07-02 MED ORDER — FAMOTIDINE 20 MG PO TABS
ORAL_TABLET | ORAL | Status: AC
  Administered 2022-07-02: 20 mg via ORAL
  Filled 2022-07-02: qty 1

## 2022-07-02 MED ORDER — OXYCODONE HCL 5 MG PO TABS
5.0000 mg | ORAL_TABLET | Freq: Once | ORAL | Status: AC | PRN
  Administered 2022-07-02: 5 mg via ORAL

## 2022-07-02 MED ORDER — HYDROMORPHONE HCL 1 MG/ML IJ SOLN
INTRAMUSCULAR | Status: AC
  Filled 2022-07-02: qty 1

## 2022-07-02 MED ORDER — OXYCODONE HCL 5 MG/5ML PO SOLN: 5.0000 mg | Freq: Once | ORAL | Status: AC | PRN

## 2022-07-02 MED ORDER — MIDAZOLAM HCL 2 MG/2ML IJ SOLN
INTRAMUSCULAR | Status: AC
  Filled 2022-07-02: qty 2

## 2022-07-02 MED ORDER — CEFAZOLIN SODIUM-DEXTROSE 2-4 GM/100ML-% IV SOLN
INTRAVENOUS | Status: AC
  Filled 2022-07-02: qty 100

## 2022-07-02 MED ORDER — 0.9 % SODIUM CHLORIDE (POUR BTL) OPTIME
TOPICAL | Status: DC | PRN
  Administered 2022-07-02: 500 mL

## 2022-07-02 MED ORDER — BUPIVACAINE-EPINEPHRINE (PF) 0.5% -1:200000 IJ SOLN
INTRAMUSCULAR | Status: DC | PRN
  Administered 2022-07-02: 50 mL

## 2022-07-02 MED ORDER — SUGAMMADEX SODIUM 200 MG/2ML IV SOLN
INTRAVENOUS | Status: DC | PRN
  Administered 2022-07-02: 200 mg via INTRAVENOUS

## 2022-07-02 MED ORDER — LACTATED RINGERS IV SOLN: INTRAVENOUS | Status: DC

## 2022-07-02 MED ORDER — GABAPENTIN 300 MG PO CAPS: 300.0000 mg | ORAL_CAPSULE | ORAL | Status: AC

## 2022-07-02 SURGICAL SUPPLY — 71 items
ADH SKN CLS APL DERMABOND .7 (GAUZE/BANDAGES/DRESSINGS) ×4
APL PRP STRL LF DISP 70% ISPRP (MISCELLANEOUS) ×4
BLADE SURG 15 STRL LF DISP TIS (BLADE) ×4 IMPLANT
BLADE SURG 15 STRL SS (BLADE) ×5
CANNULA REDUC XI 12-8 STAPL (CANNULA) ×1
CANNULA REDUCER 12-8 DVNC XI (CANNULA) ×4 IMPLANT
CHLORAPREP W/TINT 26 (MISCELLANEOUS) ×5 IMPLANT
COVER TIP SHEARS 8 DVNC (MISCELLANEOUS) ×4 IMPLANT
COVER TIP SHEARS 8MM DA VINCI (MISCELLANEOUS) ×1
COVER WAND RF STERILE (DRAPES) ×5 IMPLANT
DERMABOND ADVANCED (GAUZE/BANDAGES/DRESSINGS) ×1
DERMABOND ADVANCED .7 DNX12 (GAUZE/BANDAGES/DRESSINGS) ×4 IMPLANT
DRAPE ARM DVNC X/XI (DISPOSABLE) ×12 IMPLANT
DRAPE COLUMN DVNC XI (DISPOSABLE) ×4 IMPLANT
DRAPE DA VINCI XI ARM (DISPOSABLE) ×3
DRAPE DA VINCI XI COLUMN (DISPOSABLE) ×1
DRAPE LAPAROTOMY 77X122 PED (DRAPES) ×5 IMPLANT
ELECT CAUTERY BLADE TIP 2.5 (TIP) ×5
ELECT REM PT RETURN 9FT ADLT (ELECTROSURGICAL) ×5
ELECTRODE CAUTERY BLDE TIP 2.5 (TIP) ×4 IMPLANT
ELECTRODE REM PT RTRN 9FT ADLT (ELECTROSURGICAL) ×4 IMPLANT
GAUZE 4X4 16PLY ~~LOC~~+RFID DBL (SPONGE) ×5 IMPLANT
GLOVE SURG SYN 7.0 (GLOVE) ×10 IMPLANT
GLOVE SURG SYN 7.0 PF PI (GLOVE) ×6 IMPLANT
GLOVE SURG SYN 7.5  E (GLOVE) ×2
GLOVE SURG SYN 7.5 E (GLOVE) ×8 IMPLANT
GLOVE SURG SYN 7.5 PF PI (GLOVE) ×6 IMPLANT
GOWN STRL REUS W/ TWL LRG LVL3 (GOWN DISPOSABLE) ×16 IMPLANT
GOWN STRL REUS W/TWL LRG LVL3 (GOWN DISPOSABLE) ×20
IRRIGATION STRYKERFLOW (MISCELLANEOUS) ×3 IMPLANT
IRRIGATOR STRYKERFLOW (MISCELLANEOUS)
IV NS 1000ML (IV SOLUTION)
IV NS 1000ML BAXH (IV SOLUTION) IMPLANT
KIT PINK PAD W/HEAD ARE REST (MISCELLANEOUS) ×5
KIT PINK PAD W/HEAD ARM REST (MISCELLANEOUS) ×4 IMPLANT
LABEL OR SOLS (LABEL) ×5 IMPLANT
MANIFOLD NEPTUNE II (INSTRUMENTS) ×5 IMPLANT
MESH 3DMAX MID 4X6 LT LRG (Mesh General) ×2 IMPLANT
MESH 3DMAX MID 4X6 RT LRG (Mesh General) ×2 IMPLANT
MESH VENTRALEX ST 8CM LRG (Mesh General) ×2 IMPLANT
NDL INSUFFLATION 14GA 120MM (NEEDLE) ×3 IMPLANT
NEEDLE HYPO 22GX1.5 SAFETY (NEEDLE) ×5 IMPLANT
NEEDLE INSUFFLATION 14GA 120MM (NEEDLE) ×5 IMPLANT
NS IRRIG 500ML POUR BTL (IV SOLUTION) ×5 IMPLANT
OBTURATOR OPTICAL STANDARD 8MM (TROCAR) ×1
OBTURATOR OPTICAL STND 8 DVNC (TROCAR) ×4
OBTURATOR OPTICALSTD 8 DVNC (TROCAR) ×4 IMPLANT
PACK BASIN MINOR ARMC (MISCELLANEOUS) ×3 IMPLANT
PACK LAP CHOLECYSTECTOMY (MISCELLANEOUS) ×5 IMPLANT
SEAL CANN UNIV 5-8 DVNC XI (MISCELLANEOUS) ×12 IMPLANT
SEAL XI 5MM-8MM UNIVERSAL (MISCELLANEOUS) ×3
SET TUBE SMOKE EVAC HIGH FLOW (TUBING) ×5 IMPLANT
SOLUTION ELECTROLUBE (MISCELLANEOUS) ×5 IMPLANT
SPONGE T-LAP 18X18 ~~LOC~~+RFID (SPONGE) ×5 IMPLANT
STAPLER CANNULA SEAL DVNC XI (STAPLE) ×4 IMPLANT
STAPLER CANNULA SEAL XI (STAPLE) ×1
SUT ETHIBOND 0 MO6 C/R (SUTURE) ×5 IMPLANT
SUT MNCRL AB 4-0 PS2 18 (SUTURE) ×5 IMPLANT
SUT PROLENE 2 0 SH DA (SUTURE) ×2 IMPLANT
SUT VIC AB 2-0 SH 27 (SUTURE) ×10
SUT VIC AB 2-0 SH 27XBRD (SUTURE) ×8 IMPLANT
SUT VIC AB 3-0 SH 27 (SUTURE) ×10
SUT VIC AB 3-0 SH 27X BRD (SUTURE) ×5 IMPLANT
SUT VICRYL 0 AB UR-6 (SUTURE) ×10 IMPLANT
SUT VLOC 90 S/L VL9 GS22 (SUTURE) ×7 IMPLANT
SYR 20ML LL LF (SYRINGE) ×5 IMPLANT
SYR BULB IRRIG 60ML STRL (SYRINGE) ×5 IMPLANT
TAPE TRANSPORE STRL 2 31045 (GAUZE/BANDAGES/DRESSINGS) ×5 IMPLANT
TRAY FOLEY SLVR 16FR LF STAT (SET/KITS/TRAYS/PACK) ×5 IMPLANT
TROCAR BALLN GELPORT 12X130M (ENDOMECHANICALS) ×5 IMPLANT
WATER STERILE IRR 500ML POUR (IV SOLUTION) ×5 IMPLANT

## 2022-07-02 NOTE — Anesthesia Postprocedure Evaluation (Signed)
Anesthesia Post Note  Patient: Joseph Strong  Procedure(s) Performed: XI ROBOTIC ASSISTED INGUINAL HERNIA WITH MESH, recurrent (Bilateral: Abdomen) HERNIA REPAIR UMBILICAL ADULT open, recurrent; incarcerated (Abdomen) INSERTION OF MESH- UMBILICAL (Abdomen)  Patient location during evaluation: PACU Anesthesia Type: General Level of consciousness: awake and alert Pain management: pain level controlled Vital Signs Assessment: post-procedure vital signs reviewed and stable Respiratory status: spontaneous breathing, nonlabored ventilation, respiratory function stable and patient connected to nasal cannula oxygen Cardiovascular status: blood pressure returned to baseline and stable Postop Assessment: no apparent nausea or vomiting Anesthetic complications: no   No notable events documented.   Last Vitals:  Vitals:   07/02/22 1103 07/02/22 1127  BP:  (!) 118/59  Pulse: (!) 58 (!) 55  Resp: 16 16  Temp: 36.4 C (!) 36.3 C  SpO2: 94% 96%    Last Pain:  Vitals:   07/02/22 1127  TempSrc: Temporal  PainSc: 6                  Corinda Gubler

## 2022-07-02 NOTE — Discharge Instructions (Addendum)
AMBULATORY SURGERY  DISCHARGE INSTRUCTIONS   The drugs that you were given will stay in your system until tomorrow so for the next 24 hours you should not:  Drive an automobile Make any legal decisions Drink any alcoholic beverage   You may resume regular meals tomorrow.  Today it is better to start with liquids and gradually work up to solid foods.  You may eat anything you prefer, but it is better to start with liquids, then soup and crackers, and gradually work up to solid foods.   Please notify your doctor immediately if you have any unusual bleeding, trouble breathing, redness and pain at the surgery site, drainage, fever, or pain not relieved by medication.    Additional Instructions:  DO NOT REMOVE TEAL EXPAREL BRACELET FOR 4 DAYS (96 hours) 07/06/2022   Information for Discharge Teaching: EXPAREL (bupivacaine liposome injectable suspension)   Your surgeon or anesthesiologist gave you EXPAREL(bupivacaine) to help control your pain after surgery.  EXPAREL is a local anesthetic that provides pain relief by numbing the tissue around the surgical site. EXPAREL is designed to release pain medication over time and can control pain for up to 72 hours. Depending on how you respond to EXPAREL, you may require less pain medication during your recovery.  Possible side effects: Temporary loss of sensation or ability to move in the area where bupivacaine was injected. Nausea, vomiting, constipation Rarely, numbness and tingling in your mouth or lips, lightheadedness, or anxiety may occur. Call your doctor right away if you think you may be experiencing any of these sensations, or if you have other questions regarding possible side effects.  Follow all other discharge instructions given to you by your surgeon or nurse. Eat a healthy diet and drink plenty of water or other fluids.  If you return to the hospital for any reason within 96 hours following the administration of EXPAREL, it  is important for health care providers to know that you have received this anesthetic. A teal colored band has been placed on your arm with the date, time and amount of EXPAREL you have received in order to alert and inform your health care providers. Please leave this armband in place for the full 96 hours following administration, and then you may remove the band.         Please contact your physician with any problems or Same Day Surgery at [phone], Monday through Friday 6 am to 4 pm, or [place] at [facility] number at [phone].

## 2022-07-02 NOTE — Anesthesia Procedure Notes (Signed)
Procedure Name: Intubation Date/Time: 07/02/2022 7:38 AM  Performed by: Philbert Riser, CRNAPre-anesthesia Checklist: Patient identified, Patient being monitored, Timeout performed, Emergency Drugs available and Suction available Patient Re-evaluated:Patient Re-evaluated prior to induction Oxygen Delivery Method: Circle system utilized Preoxygenation: Pre-oxygenation with 100% oxygen Induction Type: IV induction Ventilation: Mask ventilation without difficulty Laryngoscope Size: McGraph and 4 Grade View: Grade I Tube type: Oral Tube size: 7.5 mm Number of attempts: 1 Airway Equipment and Method: Stylet Placement Confirmation: ETT inserted through vocal cords under direct vision, positive ETCO2 and breath sounds checked- equal and bilateral Secured at: 21 cm Tube secured with: Tape Dental Injury: Teeth and Oropharynx as per pre-operative assessment

## 2022-07-02 NOTE — Transfer of Care (Addendum)
Immediate Anesthesia Transfer of Care Note  Patient: Joseph Strong  Procedure(s) Performed: XI ROBOTIC ASSISTED INGUINAL HERNIA, recurrent (Right) HERNIA REPAIR UMBILICAL ADULT open, recurrent; incarcerated  Patient Location: PACU  Anesthesia Type:General  Level of Consciousness: drowsy  Airway & Oxygen Therapy: Patient Spontanous Breathing and Patient connected to face mask oxygen  Post-op Assessment: Report given to RN and Post -op Vital signs reviewed and stable  Post vital signs: Reviewed and stable  Last Vitals:  Vitals Value Taken Time  BP    Temp    Pulse    Resp    SpO2      Last Pain:  Vitals:   07/02/22 0623  TempSrc: Temporal  PainSc: 7          Complications: No notable events documented.

## 2022-07-02 NOTE — Op Note (Signed)
Procedure Date:  07/02/2022  Pre-operative Diagnosis:  Recurrent right inguinal hernia, recurrent incarcerated umbilical hernia  Post-operative Diagnosis: Recurrent bilateral inguinal hernias, recurrent incarcerated umbilical hernia 2.4 cm  Procedure: 1.  Robotic assisted Bilateral Inguinal Hernia Repair 2.  Creation of Bilateral Posterior Rectus-Transversalis Fascia Advancment Flap for Coverage of Pelvic Wound (200 cm) 3.  Open recurrent incarcerated umbilical hernia repair.  Surgeon:  [name], MD  Anesthesia:  General endotracheal  Estimated Blood Loss:  15 ml  Specimens:  None  Complications:  None  Indications for Procedure:  This is a 51 y.o. male who presents with a recurrent right inguinal hernia and a recurrent incarcerated umbilical hernia.  The options of surgery versus observation were reviewed with the patient and/or family. The risks of bleeding, abscess or infection, recurrence of symptoms, potential for an open procedure, injury to surrounding structures, and chronic pain were all discussed with the patient and he was willing to proceed.  We have planned this transabdominal procedure with the creation of right peritoneal flap based on the posterior rectus sheath and transversalis fascia in order to fully cover the mesh, creating a natural tisssue barrier for the bowel and peritoneal cavity.  Description of Procedure: The patient was correctly identified in the preoperative area and brought into the operating room.  The patient was placed supine with VTE prophylaxis in place.  Appropriate time-outs were performed.  Anesthesia was induced and the patient was intubated.  Foley catheter was placed.  Appropriate antibiotics were infused.  The abdomen was prepped and draped in a sterile fashion. A supraumbilical incision was made at the site of his hernia recurrence.  It was incarcerated. Cautery was used to dissect down the subcutaneous tissue and to free up the hernia  sac.  This consisted of preperitoneal fat.  He had a minute adjacent hernia as well, so the defects were combined into one 2.4 cm defect.  Two retention sutures were placed in the fascia and 12 mm port was inserted.  Pneumoperitoneum was obtained with appropriate opening pressures.  He was found to have bilateral inguinal hernias.  A Veress needle was used to start dissecting the peritoneal flap on both sides.  Two 8-mm robotic ports were placed in the right and left lateral positions under direct visualization.  A large left and right Bard 3D Max Mid Mesh, a 2-0 Vicryl, and two 2-0 vlock sutures were placed through the umbilical port under direct visualization.  The [employer] platform was docked onto the patient, the camera was inserted and targeted, and the instruments were placed under direct visualization.  Both inguinal regions were inspected for hernias and it was confirmed that the patient had bilateral recurrent direct inguinal hernias.  We started on the right side.  Using electocautery, the peritoneal and posterior rectus tissue flap was created.  The peritoneum on the right side was scored from the median umbilical ligament laterally towards the ASIS.  The flap was mobilized using robotic scissors and the bipolar instruments, creating a plane along the posterior rectus sheath and transversalis fascia down to the pubic tubercle medially. It was then further mobilized laterally across the inguinal canal and femoral vessels and onto the psoas muscle. The inferior epigastric vessels were identified and preserved. This created a posterior rectus and peritoneal flap measuring roughly 17 cm x 12 cm.  The hernia sac and contents were reduced preserving all structures.  A large right Bard 3D Max Mid mesh was placed with good overlap along all the  potential hernia defects and secured in place with 2-0 Vicryl along the medial superomedial and superolateral aspects.  We then moved to the left side.  Using  electocautery, the peritoneal and posterior rectus tissue flap was created.  The peritoneum on the left side was scored from the median umbilical ligament laterally towards the ASIS.  The flap was mobilized using robotic scissors and the bipolar instruments, creating a plane along the posterior rectus sheath and transversalis fascia down to the pubic tubercle medially. It was then further mobilized laterally across the inguinal canal and femoral vessels and onto the psoas muscle. The inferior epigastric vessels were identified and preserved. This created a posterior rectus and peritoneal flap measuring roughly 17 cm x 12 cm.  The hernia sac and contents were reduced preserving all structures.  A large left Bard 3D Max Mid mesh was placed with good overlap along all the potential hernia defects and secured in place with 2-0 Vicryl along the medial superomedial and superolateral aspects.    Then, the peritoneal flap was advanced over the mesh on both sides and carried over to close the defect. A running 2-0 V lock suture was used to approximate the edge of the flap onto the peritoneum on both sides.  All needles were removed under direct visualization.  The 8- mm ports were removed under direct visualization and the Hasson trocar was removed.  A large 8 cm ventralex hernia patch was inserted through the defect and splayed open.  2-0 Prolene was used to secure the tails to the fascia, and the defect was closed using 0 Ethibonds, incorporating a layer of the mesh with each bite.  Local anesthetic was infused in all incisions as well as a bilateral ilioinguinal block.  The supraumbilical incision was closed in layers using 3-0 Vicryl and 4-0 Monocryl, and the other port incisions were closed with 4-0 Monocryl.  The wounds were cleaned and sealed with DermaBond.  Foley catheter was removed and the patient was emerged from anesthesia and extubated and brought to the recovery room for further management.  The  patient tolerated the procedure well and all counts were correct at the end of the case.   [name], MD

## 2022-07-02 NOTE — Interval H&P Note (Signed)
History and Physical Interval Note:  07/02/2022 7:09 AM  Joseph Strong  has presented today for surgery, with the diagnosis of Right recurrent inguinal hernia, recurrent umbilical hernia incarcerated less 3 cm.  The various methods of treatment have been discussed with the patient and family. After consideration of risks, benefits and other options for treatment, the patient has consented to  Procedure(s): XI ROBOTIC ASSISTED INGUINAL HERNIA, recurrent (Right) HERNIA REPAIR UMBILICAL ADULT open, recurrent; incarcerated (N/A) as a surgical intervention.  The patient's history has been reviewed, patient examined, no change in status, stable for surgery.  I have reviewed the patient's chart and labs.  Questions were answered to the patient's satisfaction.     Joseph Strong

## 2022-07-02 NOTE — Anesthesia Preprocedure Evaluation (Signed)
Anesthesia Evaluation  Patient identified by MRN, date of birth, ID band Patient awake    Reviewed: Allergy & Precautions, NPO status , Patient's Chart, lab work & pertinent test results  History of Anesthesia Complications Negative for: history of anesthetic complications  Airway Mallampati: III  TM Distance: >3 FB Neck ROM: full    Dental  (+) Chipped   Pulmonary neg shortness of breath, Current Smoker and Patient abstained from smoking.,    Pulmonary exam normal        Cardiovascular Exercise Tolerance: Good (-) angina(-) Past MI Normal cardiovascular exam+ dysrhythmias      Neuro/Psych  Headaches, negative psych ROS   GI/Hepatic negative GI ROS, Neg liver ROS, neg GERD  ,  Endo/Other  negative endocrine ROS  Renal/GU      Musculoskeletal   Abdominal   Peds  Hematology negative hematology ROS (+)   Anesthesia Other Findings Past Medical History: No date: BPH (benign prostatic hyperplasia) 01/10/2016: Burning with urination No date: Constipation No date: Dysrhythmia     Comment:  "OCCASSIONAL SPEEDING UP OF HEART RATE" PT THINKING IT               MAY BE DUE TO HERNIA No date: Enlarged prostate No date: Headache     Comment:  MIGRAINES No date: Inguinal hernia     Comment:  left  No date: Scoliosis  Past Surgical History: 05/10/2022: COLONOSCOPY WITH PROPOFOL; N/A     Comment:  Procedure: COLONOSCOPY WITH PROPOFOL;  Surgeon: Toney Reil, MD;  Location: [facility] ENDOSCOPY;  Service:               Gastroenterology;  Laterality: N/A; 1998: HERNIA REPAIR; Left     Comment:  AND UMBILICAL 01/11/2016: INGUINAL HERNIA REPAIR; Right     Comment:  Procedure: HERNIA REPAIR INGUINAL ADULT;  Surgeon:               Kieth Brightly, MD;  Location: [facility] ORS;  Service:              General;  Laterality: Right; No date: TONSILLECTOMY     Comment:  childhood No date: UMBILICAL HERNIA  REPAIR     Comment:  infant  BMI    Body Mass Index: 24.40 kg/m      Reproductive/Obstetrics negative OB ROS                             Anesthesia Physical Anesthesia Plan  ASA: 2  Anesthesia Plan: General ETT   Post-op Pain Management:    Induction: Intravenous  PONV Risk Score and Plan: Ondansetron, Dexamethasone, Midazolam and Treatment may vary due to age or medical condition  Airway Management Planned: Oral ETT  Additional Equipment:   Intra-op Plan:   Post-operative Plan: Extubation in OR  Informed Consent: I have reviewed the patients History and Physical, chart, labs and discussed the procedure including the risks, benefits and alternatives for the proposed anesthesia with the patient or authorized representative who has indicated his/her understanding and acceptance.     Dental Advisory Given  Plan Discussed with: Anesthesiologist, CRNA and Surgeon  Anesthesia Plan Comments: (Patient consented for risks of anesthesia including but not limited to:  - adverse reactions to medications - damage to eyes, teeth, lips or other oral mucosa - nerve damage due to positioning  - sore throat or  hoarseness - Damage to heart, brain, nerves, lungs, other parts of body or loss of life  Patient voiced understanding.)        Anesthesia Quick Evaluation

## 2022-07-03 ENCOUNTER — Encounter: Payer: Self-pay | Admitting: Surgery

## 2022-07-05 ENCOUNTER — Telehealth: Payer: Self-pay

## 2022-07-05 NOTE — Telephone Encounter (Signed)
Patient wanted some reassurance that having some numbness left below the hip and above the knee- the right side was like this and it went away.  We discussed taking ibuprofen to help reduce any inflammation to the nerves in the area. He also stated he is constipated and we discussed using Miralax and that stool softeners are not enough sometimes to get things moving.

## 2022-07-10 ENCOUNTER — Telehealth: Payer: Self-pay | Admitting: Surgery

## 2022-07-10 NOTE — Telephone Encounter (Signed)
Incoming call from patient, he had inguinal hernia surgery on 07/02/22 with Dr. Aleen Campi.  Patient has about 10 of the Oxy left.  Has been using more of the Ibuprofen since he has more of this and alternating with the Tylenol.  Patient states that he feels the Oxycodone makes him constipated.  He just had a full bowel movement today.  Patient's main concern though is some left leg, some right but not as bad, but in the left leg primarily from just above the knee to mid thigh he is having pain, numbness and discomfort with the left. Feels like it is in the thigh muscle.  Concerned that he may have some nerve damage.  No fever, chills nor vomiting.  Please call to advise. Thank you.

## 2022-07-11 NOTE — Telephone Encounter (Signed)
Discussed previously with Dr.Piscoya and was instructed to take the ibupropen due to may possibly have some nerve irritation.   Inguinal hernia repair 07/02/2022. Patient states he continues to have the numbness in the thigh area. Schedule patient to be seen with Dr.Piscoya. Continue to take the ibuprofen. Patient may also try Miralax for constipation.

## 2022-07-15 ENCOUNTER — Encounter: Payer: Self-pay | Admitting: Surgery

## 2022-07-15 ENCOUNTER — Ambulatory Visit (INDEPENDENT_AMBULATORY_CARE_PROVIDER_SITE_OTHER): Payer: 59 | Admitting: Surgery

## 2022-07-15 VITALS — BP 119/76 | HR 103 | Temp 98.2°F | Ht 72.0 in | Wt 178.4 lb

## 2022-07-15 DIAGNOSIS — Z09 Encounter for follow-up examination after completed treatment for conditions other than malignant neoplasm: Secondary | ICD-10-CM

## 2022-07-15 DIAGNOSIS — K4091 Unilateral inguinal hernia, without obstruction or gangrene, recurrent: Secondary | ICD-10-CM

## 2022-07-15 DIAGNOSIS — K4021 Bilateral inguinal hernia, without obstruction or gangrene, recurrent: Secondary | ICD-10-CM

## 2022-07-15 DIAGNOSIS — K420 Umbilical hernia with obstruction, without gangrene: Secondary | ICD-10-CM

## 2022-07-15 NOTE — Patient Instructions (Addendum)

## 2022-07-16 ENCOUNTER — Encounter: Payer: Self-pay | Admitting: Surgery

## 2022-07-16 ENCOUNTER — Encounter: Payer: 59 | Admitting: Physician Assistant

## 2022-07-16 NOTE — Progress Notes (Signed)
07/15/2022  History of Present Illness: Joseph Strong is a 51 y.o. male s/p robotic assisted bilateral recurrent inguinal hernia repair and open recurrent umbilical hernia repair on 07/02/22.  Patient presents for follow up.  Reports that initially he had numbness in the right thigh that went away after a few days, but now has numbness in the left thigh.  Reports discomfort in both groins and umbilical area.  Past Medical History: Past Medical History:  Diagnosis Date   BPH (benign prostatic hyperplasia)    Burning with urination 01/10/2016   Constipation    Dysrhythmia    "OCCASSIONAL SPEEDING UP OF HEART RATE" PT THINKING IT MAY BE DUE TO HERNIA   Enlarged prostate    Headache    MIGRAINES   Inguinal hernia    left    Scoliosis      Past Surgical History: Past Surgical History:  Procedure Laterality Date   COLONOSCOPY WITH PROPOFOL N/A 05/10/2022   Procedure: COLONOSCOPY WITH PROPOFOL;  Surgeon: Toney Reil, MD;  Location: [facility] ENDOS[facility];  Service: Gastroenterology;  Laterality: N/A;   HERNIA REPAIR Left 1998   AND UMBILICAL   INGUINAL HERNIA REPAIR Right 01/11/2016   Procedure: HERNIA REPAIR INGUINAL ADULT;  Surgeon: Kieth Brightly, MD;  Location: [facility] ORS;  Service: General;  Laterality: Right;   INSERTION OF MESH  07/02/2022   Procedure: INSERTION OF MESH- UMBILICAL;  Surgeon: Henrene Dodge, MD;  Location: [facility] ORS;  Service: General;;   TONSILLECTOMY     childhood   UMBILICAL HERNIA REPAIR     infant   UMBILICAL HERNIA REPAIR N/A 07/02/2022   Procedure: HERNIA REPAIR UMBILICAL ADULT open, recurrent; incarcerated;  Surgeon: Henrene Dodge, MD;  Location: [facility] ORS;  Service: General;  Laterality: N/A;   XI ROBOTIC ASSISTED INGUINAL HERNIA REPAIR WITH MESH Bilateral 07/02/2022   Procedure: XI ROBOTIC ASSISTED INGUINAL HERNIA WITH MESH, recurrent;  Surgeon: Henrene Dodge, MD;  Location: [facility] ORS;  Service: General;  Laterality: Bilateral;    Home Medications: Prior  to Admission medications   Medication Sig Start Date End Date Taking? Authorizing Provider  acetaminophen (TYLENOL) 500 MG tablet Take 500 mg by mouth every 6 (six) hours as needed for mild pain.   Yes [provider]  acetaminophen (TYLENOL) 500 MG tablet Take 2 tablets (1,000 mg total) by mouth every 6 (six) hours as needed for mild pain. 07/02/22  Yes Corinthia Helmers, Elita Quick, MD  ibuprofen (ADVIL) 800 MG tablet Take 1 tablet (800 mg total) by mouth every 8 (eight) hours as needed for moderate pain. 07/02/22  Yes Kamela Blansett, Elita Quick, MD  Multiple Vitamin (MULTIVITAMIN) tablet Take 1 tablet by mouth daily.   Yes [provider]  oxyCODONE (OXY IR/ROXICODONE) 5 MG immediate release tablet Take 1 tablet (5 mg total) by mouth every 4 (four) hours as needed for severe pain. 07/02/22  Yes Jamine Wingate, MD  tadalafil (CIALIS) 5 MG tablet TAKE ONE TABLET BY MOUTH DAILY AS NEEDED FOR ERECTILE DYSFUNCTION 12/07/21  Yes McGowan, Carollee Herter A, PA-C    Allergies: No Known Allergies  Review of Systems: Review of Systems  Constitutional:  Negative for chills and fever.  Respiratory:  Negative for shortness of breath.   Cardiovascular:  Negative for chest pain.  Gastrointestinal:  Positive for abdominal pain. Negative for nausea and vomiting.  Neurological:  Positive for sensory change.    Physical Exam BP 119/76   Pulse (!) 103   Temp 98.2 F (36.8 C)   Ht 6' (1.829 m)  Wt 178 lb 6.4 oz (80.9 kg)   SpO2 98%   BMI 24.20 kg/m  CONSTITUTIONAL: No acute distress, well nourished. HEENT:  Normocephalic, atraumatic, extraocular motion intact. RESPIRATORY:  Normal respiratory effort without pathologic use of accessory muscles. CARDIOVASCULAR: Regular rhythm and rate. GI: The abdomen is soft, non-distended, with some soreness/tenderness in periumbilical region and in bilateral groin areas.  No evidence of hernia recurrence in any of the areas.  Incisions healing well and are clean, dry, intact.  NEUROLOGIC:   The patient has an area of about 10x5 cm in the anterior/lateral left thigh that has mild numbness.  No palpable masses in that area. PSYCH:  Alert and oriented to person, place and time. Affect is normal.   Assessment and Plan: This is a 51 y.o. male s/p robotic assisted bilateral recurrent inguinal hernia repair and open recurrent umbilical hernia repair.  --Discussed with the patient that in redo cases, there is likely going to be more discomfort due to more scarring and inflammation but this will continue to improve as the healing process continues.  No evidence of hernia recurrence at this point.   --Unclear the etiology of the numbness.  If it had been related to the Exparel, it should have resolved just like the right side did, and even the injection area is not part of the sensory distribution for the anterior/lateral thigh.  The hernias were direct so the dissection or scarring was not as significant near the femoral hernia space.  For now would continue watchful monitoring. --Follow up in 1 month.  I spent 20 minutes dedicated to the care of this patient on the date of this encounter to include pre-visit review of records, face-to-face time with the patient discussing diagnosis and management, and any post-visit coordination of care.   [name], MD [facility]

## 2022-07-19 ENCOUNTER — Encounter: Payer: Self-pay | Admitting: Family Medicine

## 2022-07-19 ENCOUNTER — Ambulatory Visit: Payer: 59 | Admitting: Family Medicine

## 2022-07-19 ENCOUNTER — Ambulatory Visit (INDEPENDENT_AMBULATORY_CARE_PROVIDER_SITE_OTHER): Payer: 59 | Admitting: Family Medicine

## 2022-07-19 VITALS — BP 124/78 | HR 92 | Temp 98.4°F | Resp 16 | Ht 72.0 in | Wt 182.1 lb

## 2022-07-19 DIAGNOSIS — R200 Anesthesia of skin: Secondary | ICD-10-CM | POA: Diagnosis not present

## 2022-07-19 DIAGNOSIS — R399 Unspecified symptoms and signs involving the genitourinary system: Secondary | ICD-10-CM

## 2022-07-19 NOTE — Progress Notes (Signed)
Patient ID: Joseph Strong, male    DOB: December 14, 1970, 51 y.o.   MRN: [id number]  PCP: Danelle Berry, PA-C  Chief Complaint  Patient presents with   Numbness    Pt states left leg feels more like numb and can feel pain. Pt states had hernia surgery and may have cause his numbness since then    Subjective:   Joseph Strong is a 51 y.o. male, presents to clinic with CC of the following:  HPI   Left leg numbness following surgery with general surgery for hernia repair He had sugery and injections b/l for pain management  Feels like leg weakness and persistent numbness since then   Patient Active Problem List   Diagnosis Date Noted   Recurrent umbilical hernia with incarceration    Bilateral recurrent inguinal hernia without obstruction or gangrene    Chronic pain syndrome 03/04/2021   Encounter for screening colonoscopy 03/04/2021   Disorder of skeletal system 03/04/2021   Problems influencing health status 03/04/2021   Pancreas divisum 10/09/2018   Constipation 10/09/2018   Ventral hernia without obstruction or gangrene 10/09/2018   Prostate cancer screening 09/16/2017   Abdominal pain, periumbilical 12/20/2016   Decreased frequency of bowel movements 01/10/2016   Paraspinal back pain (Left) 12/08/2015   Dizziness, nonspecific 10/31/2015   Abnormal TSH 10/31/2015   Chronic groin pain (Left) 10/31/2015   Abnormal EKG 10/31/2015   Testicular pain (Left) 09/20/2015   S/P unilateral inguinal hernia repair 09/20/2015      Current Outpatient Medications:    acetaminophen (TYLENOL) 500 MG tablet, Take 500 mg by mouth every 6 (six) hours as needed for mild pain., Disp: , Rfl:    acetaminophen (TYLENOL) 500 MG tablet, Take 2 tablets (1,000 mg total) by mouth every 6 (six) hours as needed for mild pain., Disp: , Rfl:    ibuprofen (ADVIL) 800 MG tablet, Take 1 tablet (800 mg total) by mouth every 8 (eight) hours as needed for moderate pain., Disp: 60 tablet, Rfl: 1   Multiple  Vitamin (MULTIVITAMIN) tablet, Take 1 tablet by mouth daily., Disp: , Rfl:    oxyCODONE (OXY IR/ROXICODONE) 5 MG immediate release tablet, Take 1 tablet (5 mg total) by mouth every 4 (four) hours as needed for severe pain., Disp: 30 tablet, Rfl: 0   tadalafil (CIALIS) 5 MG tablet, TAKE ONE TABLET BY MOUTH DAILY AS NEEDED FOR ERECTILE DYSFUNCTION, Disp: 90 tablet, Rfl: 3   No Known Allergies   Social History   Tobacco Use   Smoking status: Some Days    Types: Cigars    Passive exposure: Past   Smokeless tobacco: Never  Vaping Use   Vaping Use: Never used  Substance Use Topics   Alcohol use: Yes    Alcohol/week: 0.0 standard drinks of alcohol    Comment: occasional   Drug use: No      Chart Review Today: I personally reviewed active problem list, medication list, allergies, family history, social history, health maintenance, notes from last encounter, lab results, imaging with the patient/caregiver today.   Review of Systems  Constitutional: Negative.   HENT: Negative.    Eyes: Negative.   Respiratory: Negative.    Cardiovascular: Negative.   Gastrointestinal: Negative.   Endocrine: Negative.   Genitourinary: Negative.   Musculoskeletal: Negative.   Skin: Negative.   Allergic/Immunologic: Negative.   Neurological: Negative.   Hematological: Negative.   Psychiatric/Behavioral: Negative.    All other systems reviewed and are negative.  Objective:   Vitals:   07/19/22 1507  BP: 124/78  Pulse: 92  Resp: 16  Temp: 98.4 F (36.9 C)  TempSrc: Oral  SpO2: 99%  Weight: 182 lb 1.6 oz (82.6 kg)  Height: 6' (1.829 m)    Body mass index is 24.7 kg/m.  Physical Exam Vitals and nursing note reviewed.  Constitutional:      General: He is not in acute distress.    Appearance: Normal appearance. He is well-developed. He is not ill-appearing, toxic-appearing or diaphoretic.  HENT:     Head: Normocephalic and atraumatic.     Right Ear: External ear normal.      Left Ear: External ear normal.     Nose: Nose normal.  Eyes:     General:        Right eye: No discharge.        Left eye: No discharge.     Conjunctiva/sclera: Conjunctivae normal.  Neck:     Trachea: No tracheal deviation.  Cardiovascular:     Rate and Rhythm: Normal rate and regular rhythm.  Pulmonary:     Effort: Pulmonary effort is normal. No respiratory distress.     Breath sounds: No stridor.  Musculoskeletal:        General: Normal range of motion.  Skin:    General: Skin is warm and dry.     Findings: No rash.  Neurological:     Mental Status: He is alert.     Motor: No abnormal muscle tone.     Coordination: Coordination normal.  Psychiatric:        Behavior: Behavior normal.      Results for orders placed or performed in visit on 02/22/22  Thyroid Panel With TSH  Result Value Ref Range   T3 Uptake 33 22 - 35 %   T4, Total 7.3 4.9 - 10.5 mcg/dL   Free Thyroxine Index 2.4 1.4 - 3.8   TSH 0.42 0.40 - 4.50 mIU/L       Assessment & Plan:   Pt presents with several concerns, he just had a repeated hernia surgery, he endorses pain in his groin, difficulty with urination which she expected to be better, left lateral thigh numbness and some intermittent shooting of pain down his legs.  He has been seen by surgery and followed up with him he reports to me that he was given some long-lasting pain shot and I explained to him that he would need to follow-up or ask questions to the surgeons or anesthesia to see if his current symptoms could possibly be related to their procedures or surgeries.  I do not see any concerning findings today and expect that this is some temporary but prolonged postop symptoms, attempted to reassure patient, encouraged him to monitor for another couple weeks or months as previously directed by his surgeon who also tried to reassure him. He does have a urological history and he wishes to follow-up with urology today with worsening lower urinary tract  symptoms.     ICD-10-CM   1. Numbness of left anterior thigh  R20.0    recommended wait another 2 months prior to any neurology referral or work up - likely postop SE    2. Lower urinary tract symptoms (LUTS)  R39.9 Ambulatory referral to Urology         Danelle Berry, PA-C 07/19/22 3:21 PM

## 2022-07-24 NOTE — Progress Notes (Deleted)
12/16/2018  8:00 PM   Joseph Strong Jun 21, 1971 [id number]  Referring provider: Danelle Berry, PA-C [address] [place],  [place] [address]  Urological history: 1. BPH with LU TS - cysto in 2018 NED - I PSS *** - PVR *** mL - tadalafil 5 mg daily  2. Chronic pelvic pain/dysuria - scrotal US x 2 - NED - CT scans x 2 - NED - MRI NED - colonoscopy (04/2022) - NED  3. ED -Contributing factors of age, BPH, smoking -SHIM *** - tadalafil 5 mg daily  4. Prostate cancer screening -PSA (01/2022) 0.64 -baseline PSA (2017) 0.7 at age 89 -African ancestry -No family history of prostate cancer, breast cancer or ovarian cancer  HPI: Joseph Strong is a 51 y.o. male with BPH with LU TS, chronic pain and dysuria who presents for a IPSS, PSA and exam.       Score:  1-7 Mild 8-19 Moderate 20-35 Severe    Score: 1-7 Severe ED 8-11 Moderate ED 12-16 Mild-Moderate ED 17-21 Mild ED 22-25 No ED   PMH: Past Medical History:  Diagnosis Date   BPH (benign prostatic hyperplasia)    Burning with urination 01/10/2016   Constipation    Dysrhythmia    "OCCASSIONAL SPEEDING UP OF HEART RATE" PT THINKING IT MAY BE DUE TO HERNIA   Enlarged prostate    Headache    MIGRAINES   Inguinal hernia    left    Scoliosis     Surgical History: Past Surgical History:  Procedure Laterality Date   COLONOSCOPY WITH PROPOFOL N/A 05/10/2022   Procedure: COLONOSCOPY WITH PROPOFOL;  Surgeon: Toney Reil, MD;  Location: [facility];  Service: Gastroenterology;  Laterality: N/A;   HERNIA REPAIR Left 1998   AND UMBILICAL   INGUINAL HERNIA REPAIR Right 01/11/2016   Procedure: HERNIA REPAIR INGUINAL ADULT;  Surgeon: Kieth Brightly, MD;  Location: [facility];  Service: General;  Laterality: Right;   INSERTION OF MESH  07/02/2022   Procedure: INSERTION OF MESH- UMBILICAL;  Surgeon: Henrene Dodge, MD;  Location: [facility];  Service: General;;   TONSILLECTOMY     childhood    UMBILICAL HERNIA REPAIR     infant   UMBILICAL HERNIA REPAIR N/A 07/02/2022   Procedure: HERNIA REPAIR UMBILICAL ADULT open, recurrent; incarcerated;  Surgeon: Henrene Dodge, MD;  Location: [facility];  Service: General;  Laterality: N/A;   XI ROBOTIC ASSISTED INGUINAL HERNIA REPAIR WITH MESH Bilateral 07/02/2022   Procedure: XI ROBOTIC ASSISTED INGUINAL HERNIA WITH MESH, recurrent;  Surgeon: Henrene Dodge, MD;  Location: [facility];  Service: General;  Laterality: Bilateral;    Home Medications:  Allergies as of 07/25/2022   No Known Allergies      Medication List        Accurate as of July 24, 2022  8:00 PM. If you have any questions, ask your nurse or doctor.          acetaminophen 500 MG tablet Commonly known as: TYLENOL Take 500 mg by mouth every 6 (six) hours as needed for mild pain.   acetaminophen 500 MG tablet Commonly known as: TYLENOL Take 2 tablets (1,000 mg total) by mouth every 6 (six) hours as needed for mild pain.   ibuprofen 800 MG tablet Commonly known as: ADVIL Take 1 tablet (800 mg total) by mouth every 8 (eight) hours as needed for moderate pain.   multivitamin tablet Take 1 tablet by mouth daily.   oxyCODONE 5 MG immediate release tablet  Commonly known as: Oxy IR/ROXICODONE Take 1 tablet (5 mg total) by mouth every 4 (four) hours as needed for severe pain.   tadalafil 5 MG tablet Commonly known as: CIALIS TAKE ONE TABLET BY MOUTH DAILY AS NEEDED FOR ERECTILE DYSFUNCTION        Allergies: No Known Allergies  Family History: Family History  Problem Relation Age of Onset   Hypertension Maternal Grandmother    Prostate cancer Neg Hx    Kidney cancer Neg Hx    Bladder Cancer Neg Hx     Social History:  reports that he has been smoking cigars. He has been exposed to tobacco smoke. He has never used smokeless tobacco. He reports current alcohol use. He reports that he does not use drugs.  ROS: For pertinent review of systems please refer to  history of present illness  Physical Exam: There were no vitals taken for this visit.  Constitutional:  Well nourished. Alert and oriented, No acute distress. HEENT: [place] AT, moist mucus membranes.  Trachea midline Cardiovascular: No clubbing, cyanosis, or edema. Respiratory: Normal respiratory effort, no increased work of breathing. GU: No CVA tenderness.  No bladder fullness or masses.  Patient with circumcised/uncircumcised phallus. ***Foreskin easily retracted***  Urethral meatus is patent.  No penile discharge. No penile lesions or rashes. Scrotum without lesions, cysts, rashes and/or edema.  Testicles are located scrotally bilaterally. No masses are appreciated in the testicles. Left and right epididymis are normal. Rectal: Patient with  normal sphincter tone. Anus and perineum without scarring or rashes. No rectal masses are appreciated. Prostate is approximately *** grams, *** nodules are appreciated. Seminal vesicles are normal. Neurologic: Grossly intact, no focal deficits, moving all 4 extremities. Psychiatric: Normal mood and affect.   Laboratory Data: Component     Latest Ref Rng 04/26/2022  TSH     0.40 - 4.50 mIU/L 0.42   T3 Uptake     22 - 35 % 33   Thyroxine (T4)     4.9 - 10.5 mcg/dL 7.3   Free Thyroxine Index     1.4 - 3.8  2.4     Component     Latest Ref Rng 02/21/2022  Glucose, UA     NEGATIVE  NEGATIVE   Appearance     CLEAR  CLEAR   Color, Urine     YELLOW  YELLOW   Specific Gravity, Urine     1.001 - 1.035  1.007   pH     5.0 - 8.0  6.0   Hgb urine dipstick     NEGATIVE  NEGATIVE   Bilirubin Urine     NEGATIVE  NEGATIVE   Ketones, ur     NEGATIVE  NEGATIVE   Protein     NEGATIVE  NEGATIVE   Nitrite     NEGATIVE  NEGATIVE   Leukocytes,Ua     NEGATIVE  NEGATIVE   RBC / HPF     0 - 2 /HPF NONE SEEN   WBC, UA     0 - 5 /HPF NONE SEEN   Bacteria, UA     NONE SEEN /HPF NONE SEEN   Squamous Epithelial / LPF     < OR = 5 /HPF NONE SEEN   Hyaline  Cast     NONE SEEN /LPF NONE SEEN   NOTE: --    Component     Latest Ref Rng 02/21/2022  Hemoglobin A1C     <5.7 % of total Hgb 5.5   Mean  Plasma Glucose     mg/dL 111   eAG (mmol/L)     mmol/L 6.2     Component     Latest Ref Rng 02/21/2022  PSA     < OR = 4.00 ng/mL 0.64   I have reviewed the labs.   Pertinent Imaging: ***   Assessment & Plan:    1. BPH with LUTS -PSA stable -DRE benign -PVR < 300 cc -continue conservative management, avoiding bladder irritants and timed voiding's -Continue Cialis 5 mg daily  2. Chronic pelvic pain -***  3. Prostate cancer screening -Discussed AUA guidelines for prostate cancer screening -baseline PSA slightly above 0.68 ng/mL placing him a higher risk of developing high grade prostate cancer in the next 20 to 25 years -Recommend yearly continual screening  No follow-ups on file.  These notes generated with voice recognition software. I apologize for typographical errors.  [place], [place] [address] [place],  [address] [phone]

## 2022-07-25 ENCOUNTER — Ambulatory Visit: Payer: 59 | Admitting: Urology

## 2022-07-25 DIAGNOSIS — G8929 Other chronic pain: Secondary | ICD-10-CM

## 2022-07-25 DIAGNOSIS — Z125 Encounter for screening for malignant neoplasm of prostate: Secondary | ICD-10-CM

## 2022-07-25 DIAGNOSIS — N138 Other obstructive and reflux uropathy: Secondary | ICD-10-CM

## 2022-07-25 NOTE — Progress Notes (Signed)
12/16/2018  11:08 AM   Joseph Strong Joseph Strong 17-Nov-1971 [id number]  Referring provider: Danelle Berry, PA-C [address] [place],  [place] [address]  Urological history: 1. BPH with LU TS - cysto in 2018 NED - I PSS 9/0 - PVR 0 mL - tadalafil 5 mg daily  2. Chronic pelvic pain/dysuria - scrotal US x 2 - NED - CT scans x 2 - NED - MRI NED - colonoscopy (04/2022) - NED  3. ED -Contributing factors of age, BPH, smoking -SHIM 25 - tadalafil 5 mg daily  4. Prostate cancer screening -PSA (01/2022) 0.64 -baseline PSA (2017) 0.7 at age 49 (slightly > median for age group) -African ancestry -No family history of prostate cancer, breast cancer or ovarian cancer   HPI: Joseph Strong is a 51 y.o. male with BPH with LU TS, chronic pain and dysuria who presents for a IPSS, PSA and exam.  He is concerned that he continues to have left thigh numbness.  He feels that the left thigh numbness should be resolved by now as he had right thigh numbness that resolved after a few days.  He was given reassurance by his surgeon and his PCP to give it more time, but he is very anxious that something is seriously wrong and wants to make sure there is no permanent nerve damage.  He has been taking the tadalafil 5 mg daily.  Patient denies any modifying or aggravating factors.  Patient denies any gross hematuria, dysuria or suprapubic/flank pain.  Patient denies any fevers, chills, nausea or vomiting.      IPSS     Row Name 07/26/22 1000         International Prostate Symptom Score   How often have you had the sensation of not emptying your bladder? Less than 1 in 5     How often have you had to urinate less than every two hours? Less than half the time     How often have you found you stopped and started again several times when you urinated? Less than 1 in 5 times     How often have you found it difficult to postpone urination? Less than 1 in 5 times     How often have you had a weak  urinary stream? Less than half the time     How often have you had to strain to start urination? Less than half the time     Total IPSS Score 9       Quality of Life due to urinary symptoms   If you were to spend the rest of your life with your urinary condition just the way it is now how would you feel about that? Delighted                Score:  1-7 Mild 8-19 Moderate 20-35 Severe  Patient is still having spontaneous erections.  He denies any pain or curvature with erections.     SHIM     Row Name 07/26/22 1039         SHIM: Over the last 6 months:   How do you rate your confidence that you could get and keep an erection? Very High     When you had erections with sexual stimulation, how often were your erections hard enough for penetration (entering your partner)? Almost Always or Always     During sexual intercourse, how often were you able to maintain your erection after you had penetrated (entered) your partner?  Almost Always or Always     During sexual intercourse, how difficult was it to maintain your erection to completion of intercourse? Not Difficult     When you attempted sexual intercourse, how often was it satisfactory for you? Almost Always or Always       SHIM Total Score   SHIM 25              Score: 1-7 Severe ED 8-11 Moderate ED 12-16 Mild-Moderate ED 17-21 Mild ED 22-25 No ED   PMH: Past Medical History:  Diagnosis Date   BPH (benign prostatic hyperplasia)    Burning with urination 01/10/2016   Constipation    Dysrhythmia    "OCCASSIONAL SPEEDING UP OF HEART RATE" PT THINKING IT MAY BE DUE TO HERNIA   Enlarged prostate    Headache    MIGRAINES   Inguinal hernia    left    Scoliosis     Surgical History: Past Surgical History:  Procedure Laterality Date   COLONOSCOPY WITH PROPOFOL N/A 05/10/2022   Procedure: COLONOSCOPY WITH PROPOFOL;  Surgeon: Lin Landsman, MD;  Location: [facility];  Service: Gastroenterology;   Laterality: N/A;   HERNIA REPAIR Left [id number]   AND UMBILICAL   INGUINAL HERNIA REPAIR Right 01/11/2016   Procedure: HERNIA REPAIR INGUINAL ADULT;  Surgeon: Christene Lye, MD;  Location: [facility];  Service: General;  Laterality: Right;   INSERTION OF MESH  07/02/2022   Procedure: INSERTION OF MESH- UMBILICAL;  Surgeon: Olean Ree, MD;  Location: [facility];  Service: General;;   TONSILLECTOMY     childhood   UMBILICAL HERNIA REPAIR     infant   UMBILICAL HERNIA REPAIR N/A 07/02/2022   Procedure: HERNIA REPAIR UMBILICAL ADULT open, recurrent; incarcerated;  Surgeon: Olean Ree, MD;  Location: [facility];  Service: General;  Laterality: N/A;   XI ROBOTIC ASSISTED INGUINAL HERNIA REPAIR WITH MESH Bilateral 07/02/2022   Procedure: XI ROBOTIC ASSISTED INGUINAL HERNIA WITH MESH, recurrent;  Surgeon: Olean Ree, MD;  Location: [facility];  Service: General;  Laterality: Bilateral;    Home Medications:  Allergies as of 07/26/2022   No Known Allergies      Medication List        Accurate as of July 26, 2022 11:08 AM. If you have any questions, ask your nurse or doctor.          acetaminophen 500 MG tablet Commonly known as: TYLENOL Take 500 mg by mouth every 6 (six) hours as needed for mild pain.   acetaminophen 500 MG tablet Commonly known as: TYLENOL Take 2 tablets (1,000 mg total) by mouth every 6 (six) hours as needed for mild pain.   ibuprofen 800 MG tablet Commonly known as: ADVIL Take 1 tablet (800 mg total) by mouth every 8 (eight) hours as needed for moderate pain.   multivitamin tablet Take 1 tablet by mouth daily.   oxyCODONE 5 MG immediate release tablet Commonly known as: Oxy IR/ROXICODONE Take 1 tablet (5 mg total) by mouth every 4 (four) hours as needed for severe pain.   tadalafil 5 MG tablet Commonly known as: CIALIS TAKE ONE TABLET BY MOUTH DAILY AS NEEDED FOR ERECTILE DYSFUNCTION        Allergies: No Known Allergies  Family History: Family  History  Problem Relation Age of Onset   Hypertension Maternal Grandmother    Prostate cancer Neg Hx    Kidney cancer Neg Hx    Bladder Cancer Neg Hx  Social History:  reports that he has been smoking cigars. He has been exposed to tobacco smoke. He has never used smokeless tobacco. He reports current alcohol use. He reports that he does not use drugs.  ROS: For pertinent review of systems please refer to history of present illness  Physical Exam: BP 119/82   Pulse 80   Ht 6' (1.829 m)   Wt 181 lb 3.2 oz (82.2 kg)   BMI 24.58 kg/m   Constitutional:  Well nourished. Alert and oriented, No acute distress. HEENT: [place] AT, moist mucus membranes.  Trachea midline Cardiovascular: No clubbing, cyanosis, or edema. Respiratory: Normal respiratory effort, no increased work of breathing. GU: No CVA tenderness.  No bladder fullness or masses.  Patient with circumcised phallus.  Urethral meatus is patent.  No penile discharge. No penile lesions or rashes. Scrotum without lesions, cysts, rashes and/or edema.  Testicles are located scrotally bilaterally. No masses are appreciated in the testicles. Left and right epididymis are normal. Neurologic: Grossly intact, no focal deficits, moving all 4 extremities. Psychiatric: Normal mood and affect.   Laboratory Data: Component     Latest Ref Rng 04/26/2022  TSH     0.40 - 4.50 mIU/L 0.42   T3 Uptake     22 - 35 % 33   Thyroxine (T4)     4.9 - 10.5 mcg/dL 7.3   Free Thyroxine Index     1.4 - 3.8  2.4     Component     Latest Ref Rng 02/21/2022  Glucose, UA     NEGATIVE  NEGATIVE   Appearance     CLEAR  CLEAR   Color, Urine     YELLOW  YELLOW   Specific Gravity, Urine     1.001 - 1.035  1.007   pH     5.0 - 8.0  6.0   Hgb urine dipstick     NEGATIVE  NEGATIVE   Bilirubin Urine     NEGATIVE  NEGATIVE   Ketones, ur     NEGATIVE  NEGATIVE   Protein     NEGATIVE  NEGATIVE   Nitrite     NEGATIVE  NEGATIVE   Leukocytes,Ua      NEGATIVE  NEGATIVE   RBC / HPF     0 - 2 /HPF NONE SEEN   WBC, UA     0 - 5 /HPF NONE SEEN   Bacteria, UA     NONE SEEN /HPF NONE SEEN   Squamous Epithelial / LPF     < OR = 5 /HPF NONE SEEN   Hyaline Cast     NONE SEEN /LPF NONE SEEN   NOTE: --    Component     Latest Ref Rng 02/21/2022  Hemoglobin A1C     <5.7 % of total Hgb 5.5   Mean Plasma Glucose     mg/dL 111   eAG (mmol/L)     mmol/L 6.2     Component     Latest Ref Rng 02/21/2022  PSA     < OR = 4.00 ng/mL 0.64   I have reviewed the labs.   Pertinent Imaging:  07/26/22 10:36  Scan Result 0     Assessment & Plan:    1. BPH with LUTS -PSA stable -PVR < 300 cc -continue conservative management, avoiding bladder irritants and timed voiding's -Continue Cialis 5 mg daily  2. Chronic pelvic pain -likely aggravated by hernia surgery -gave patient reassurance   3. Prostate  cancer screening -Discussed AUA guidelines for prostate cancer screening -baseline PSA slightly above 0.68 ng/mL placing him a higher risk of developing high grade prostate cancer in the next 20 to 25 years -Recommend yearly continual screening  4. Left thigh numbness -he is very worried that something is wrong as he states he was told that he would be recovered in 6 weeks and he feels he should be recovered by now -attempted to reassure the patient, but he would like a referral to neurology  Return for keep appointment w/me in February 2024.  These notes generated with voice recognition software. I apologize for typographical errors.  Cloretta Ned  [facility] [address] [place], [place] [address] [phone]

## 2022-07-26 ENCOUNTER — Ambulatory Visit (INDEPENDENT_AMBULATORY_CARE_PROVIDER_SITE_OTHER): Payer: 59 | Admitting: Urology

## 2022-07-26 ENCOUNTER — Encounter: Payer: Self-pay | Admitting: Urology

## 2022-07-26 VITALS — BP 119/82 | HR 80 | Ht 72.0 in | Wt 181.2 lb

## 2022-07-26 DIAGNOSIS — N138 Other obstructive and reflux uropathy: Secondary | ICD-10-CM | POA: Diagnosis not present

## 2022-07-26 DIAGNOSIS — N529 Male erectile dysfunction, unspecified: Secondary | ICD-10-CM | POA: Diagnosis not present

## 2022-07-26 DIAGNOSIS — R200 Anesthesia of skin: Secondary | ICD-10-CM

## 2022-07-26 DIAGNOSIS — Z125 Encounter for screening for malignant neoplasm of prostate: Secondary | ICD-10-CM

## 2022-07-26 DIAGNOSIS — N401 Enlarged prostate with lower urinary tract symptoms: Secondary | ICD-10-CM | POA: Diagnosis not present

## 2022-07-26 LAB — BLADDER SCAN AMB NON-IMAGING: Scan Result: 0

## 2022-08-02 ENCOUNTER — Encounter: Payer: Self-pay | Admitting: Surgery

## 2022-08-02 ENCOUNTER — Ambulatory Visit (INDEPENDENT_AMBULATORY_CARE_PROVIDER_SITE_OTHER): Payer: 59 | Admitting: Surgery

## 2022-08-02 VITALS — BP 124/81 | HR 76 | Temp 97.9°F | Wt 179.4 lb

## 2022-08-02 DIAGNOSIS — Z09 Encounter for follow-up examination after completed treatment for conditions other than malignant neoplasm: Secondary | ICD-10-CM

## 2022-08-02 DIAGNOSIS — K4091 Unilateral inguinal hernia, without obstruction or gangrene, recurrent: Secondary | ICD-10-CM

## 2022-08-02 DIAGNOSIS — K420 Umbilical hernia with obstruction, without gangrene: Secondary | ICD-10-CM

## 2022-08-02 DIAGNOSIS — K4021 Bilateral inguinal hernia, without obstruction or gangrene, recurrent: Secondary | ICD-10-CM

## 2022-08-02 NOTE — Progress Notes (Signed)
08/02/2022  HPI: Joseph Strong is a 51 y.o. male s/p robotic assisted bilateral recurrent inguinal hernia repair and open recurrent umbilical hernia repair on 07/02/22.  Patient had pain in both groins and umbilical areas for quite some time before surgery and was chronic in nature, as he had been hesitant about surgery since I saw him in 2021 and kept putting it off.  Today, he reports he still has soreness over the incisions and left groin area and still an area of numbness in the left anterior/lateral thigh.  He saw urology on 07/26/22 and got a referral for Neurology, but has not gotten a call back yet.  Vital signs: BP 124/81   Pulse 76   Temp 97.9 F (36.6 C) (Oral)   Wt 179 lb 6.4 oz (81.4 kg)   SpO2 100%   BMI 24.33 kg/m    Physical Exam: Constitutional:  No acute distress Abdomen:  soft, non-distended, non-tender to palpation.  No evidence of hernia recurrence.  Incisions healing well.  Assessment/Plan: This is a 51 y.o. male s/p robotic assisted recurrent bilateral inguinal hernia repair, open recurrent umbilical hernia repair.  --Patient has no evidence of hernia recurrence.  The incisions are healing well.  Discussed with him that the soreness is expected after surgery, and as the healing process continues the soreness should keep improving.  He had chronic pain before surgery, so also reminded him that this may be related to the scarring from before rather than new issues.  It is unclear why he would have anterior/lateral numbness after surgery as his hernia was direct and no work was done near the femoral nerve, and the Exparel injection should wear off in just a few days like it did on the right side. --He asked for work note to extend his time off for two more weeks as he's not comfortable going back to work without restrictions on 08/12/22.  Extension given.  I think after 8 weeks, there are no restrictions afterwards and no further extensions will be needed/given. --Recommended  that he keep appointment with Neurology when they call to schedule. --Follow up as needed.   [name], MD [facility]

## 2022-08-02 NOTE — Patient Instructions (Signed)
If you have any concerns or questions, please feel free to call our office.   Laparoscopic Inguinal Hernia Repair, Adult, Care After The following information offers guidance on how to care for yourself after your procedure. Your health care provider may also give you more specific instructions. If you have problems or questions, contact your health care provider. What can I expect after the procedure? After the procedure, it is common to have: Pain. Swelling and bruising around the incision area. Scrotal swelling, in males. Some fluid or blood draining from your incisions. Follow these instructions at home: Medicines Take over-the-counter and prescription medicines only as told by your health care provider. Ask your health care provider if the medicine prescribed to you: Requires you to avoid driving or using machinery. Can cause constipation. You may need to take these actions to prevent or treat constipation: Drink enough fluid to keep your urine pale yellow. Take over-the-counter or prescription medicines. Eat foods that are high in fiber, such as beans, whole grains, and fresh fruits and vegetables. Limit foods that are high in fat and processed sugars, such as fried or sweet foods. Incision care  Follow instructions from your health care provider about how to take care of your incisions. Make sure you: Wash your hands with soap and water for at least 20 seconds before and after you change your bandage (dressing). If soap and water are not available, use hand sanitizer. Change your dressing as told by your health care provider. Leave stitches (sutures), skin glue, or adhesive strips in place. These skin closures may need to stay in place for 2 weeks or longer. If adhesive strip edges start to loosen and curl up, you may trim the loose edges. Do not remove adhesive strips completely unless your health care provider tells you to do that. Check your incision area every day for signs of  infection. Check for: More redness, swelling, or pain. More fluid or blood. Warmth. Pus or a bad smell. Wear loose, soft clothing while your incisions heal. Managing pain and swelling If directed, put ice on the painful or swollen areas. To do this: Put ice in a plastic bag. Place a towel between your skin and the bag. Leave the ice on for 20 minutes, 2-3 times a day. Remove the ice if your skin turns bright red. This is very important. If you cannot feel pain, heat, or cold, you have a greater risk of damage to the area.  Activity Do not lift anything that is heavier than 10 lb (4.5 kg), or the limit that you are told, until your health care provider says that it is safe. Ask your health care provider what activities are safe for you. A lot of activity during the first week after surgery can increase pain and swelling. For 1 week after your procedure: Avoid activities that take a lot of effort, such as exercise or sports. You may walk and climb stairs as needed for daily activity, but avoid long walks or climbing stairs for exercise. General instructions If you were given a sedative during the procedure, it can affect you for several hours. Do not drive or operate machinery until your health care provider says that it is safe. Do not take baths, swim, or use a hot tub until your health care provider approves. Ask your health care provider if you may take showers. You may only be allowed to take sponge baths. Do not use any products that contain nicotine or tobacco. These products include cigarettes,  chewing tobacco, and vaping devices, such as e-cigarettes. If you need help quitting, ask your health care provider. Keep all follow-up visits. This is important. Contact a health care provider if: You have any of these signs of infection: More redness, swelling, or pain around your incisions or your groin area. More fluid or blood coming from an incision. Warmth coming from an incision. Pus  or a bad smell coming from an incision. A fever or chills. You have more swelling in your scrotum, if you are male. You have severe pain and medicines do not help. You have abdominal pain or swelling. You cannot urinate or have a bowel movement. You faint or feel dizzy. You have nausea and vomiting. Get help right away if: You have redness, warmth, or pain in your leg. You have chest pain. You have problems breathing. These symptoms may represent a serious problem that is an emergency. Do not wait to see if the symptoms will go away. Get medical help right away. Call your local emergency services (911 in the [place] Do not drive yourself to the hospital. Summary Pain, swelling, and bruising are common after the procedure. Check your incision area every day for signs of infection, such as more redness, swelling, or pain. Put ice on painful or swollen areas for 20 minutes, 2-3 times a day. This information is not intended to replace advice given to you by your health care provider. Make sure you discuss any questions you have with your health care provider. Document Revised: 07/11/2020 Document Reviewed: 07/11/2020 Elsevier Patient Education  2023 Elsevier Inc.  

## 2022-08-08 ENCOUNTER — Ambulatory Visit (INDEPENDENT_AMBULATORY_CARE_PROVIDER_SITE_OTHER): Payer: 59 | Admitting: Family Medicine

## 2022-08-08 ENCOUNTER — Encounter: Payer: Self-pay | Admitting: Family Medicine

## 2022-08-08 ENCOUNTER — Ambulatory Visit
Admission: RE | Admit: 2022-08-08 | Discharge: 2022-08-08 | Disposition: A | Discharge status: Home / Self Care | Payer: 59 | Attending: Family Medicine | Admitting: Family Medicine

## 2022-08-08 ENCOUNTER — Ambulatory Visit
Admission: RE | Admit: 2022-08-08 | Discharge: 2022-08-08 | Disposition: A | Discharge status: Home / Self Care | Payer: 59 | Source: Ambulatory Visit | Attending: Family Medicine | Admitting: Family Medicine

## 2022-08-08 VITALS — BP 110/70 | HR 100 | Temp 97.7°F | Resp 16 | Ht 72.0 in | Wt 179.2 lb

## 2022-08-08 DIAGNOSIS — G629 Polyneuropathy, unspecified: Secondary | ICD-10-CM

## 2022-08-08 DIAGNOSIS — R194 Change in bowel habit: Secondary | ICD-10-CM

## 2022-08-08 DIAGNOSIS — K5900 Constipation, unspecified: Secondary | ICD-10-CM

## 2022-08-08 DIAGNOSIS — R200 Anesthesia of skin: Secondary | ICD-10-CM

## 2022-08-08 DIAGNOSIS — M419 Scoliosis, unspecified: Secondary | ICD-10-CM

## 2022-08-08 NOTE — Patient Instructions (Addendum)
[facility] [address] [place], [place] [address] Phone: [phone] They have walk in hours - so call or go to their website to get an appointment if they don't get you an appointment quickly   Go to outpatient imaging to get the xray of your abdomen - you just walk in

## 2022-08-08 NOTE — Progress Notes (Unsigned)
Patient ID: Joseph Strong, male    DOB: [birth year], 51 y.o.   MRN: [id number]  PCP: Danelle Berry, PA-C  Chief Complaint  Patient presents with   Hospitalization Follow-up    Hernia surgery   Pain    Possible nerve pain on left leg   Numbness    On left leg    Subjective:   Joseph Strong is a 51 y.o. male, presents to clinic with CC of the following:  HPI   Pt following up again after surgery with Dr. Aleen Campi - Pt is still having symptoms like nerve pain and numbness to left thigh, pain, pressure, GU sx, bowel changes  He did FMLA with specialists 08/27/22 - per Dr. Aleen Campi --He asked for work note to extend his time off for two more weeks as he's not comfortable going back to work without restrictions on 08/12/22.  Extension given.  I think after 8 weeks, there are no restrictions afterwards and no further extensions will be needed/given.  Pt recently saw urology who saw for same complaints - she did refer to neuro and they can only see him end of October He needs sooner appt Roxan Hockey note includes -  He is concerned that he continues to have left thigh numbness.  He feels that the left thigh numbness should be resolved by now as he had right thigh numbness that resolved after a few days.  He was given reassurance by his surgeon and his PCP to give it more time, but he is very anxious that something is seriously wrong and wants to make sure there is no permanent nerve damage.      Patient Active Problem List   Diagnosis Date Noted   Recurrent umbilical hernia with incarceration    Bilateral recurrent inguinal hernia without obstruction or gangrene    Chronic pain syndrome 03/04/2021   Encounter for screening colonoscopy 03/04/2021   Disorder of skeletal system 03/04/2021   Problems influencing health status 03/04/2021   Pancreas divisum 10/09/2018   Constipation 10/09/2018   Ventral hernia without obstruction or gangrene 10/09/2018   Prostate cancer screening  09/16/2017   Abdominal pain, periumbilical 12/20/2016   Decreased frequency of bowel movements 01/10/2016   Paraspinal back pain (Left) 12/08/2015   Dizziness, nonspecific 10/31/2015   Abnormal TSH 10/31/2015   Chronic groin pain (Left) 10/31/2015   Abnormal EKG 10/31/2015   Testicular pain (Left) 09/20/2015   S/P unilateral inguinal hernia repair 09/20/2015      Current Outpatient Medications:    acetaminophen (TYLENOL) 500 MG tablet, Take 500 mg by mouth every 6 (six) hours as needed for mild pain., Disp: , Rfl:    acetaminophen (TYLENOL) 500 MG tablet, Take 2 tablets (1,000 mg total) by mouth every 6 (six) hours as needed for mild pain., Disp: , Rfl:    ibuprofen (ADVIL) 800 MG tablet, Take 1 tablet (800 mg total) by mouth every 8 (eight) hours as needed for moderate pain., Disp: 60 tablet, Rfl: 1   Multiple Vitamin (MULTIVITAMIN) tablet, Take 1 tablet by mouth daily., Disp: , Rfl:    oxyCODONE (OXY IR/ROXICODONE) 5 MG immediate release tablet, Take 1 tablet (5 mg total) by mouth every 4 (four) hours as needed for severe pain., Disp: 30 tablet, Rfl: 0   tadalafil (CIALIS) 5 MG tablet, TAKE ONE TABLET BY MOUTH DAILY AS NEEDED FOR ERECTILE DYSFUNCTION, Disp: 90 tablet, Rfl: 3   No Known Allergies   Social History   Tobacco Use   Smoking  status: Some Days    Types: Cigars    Passive exposure: Past   Smokeless tobacco: Never  Vaping Use   Vaping Use: Never used  Substance Use Topics   Alcohol use: Yes    Alcohol/week: 0.0 standard drinks of alcohol    Comment: occasional   Drug use: No      Chart Review Today: I personally reviewed active problem list, medication list, allergies, family history, social history, health maintenance, notes from last encounter, lab results, imaging with the patient/caregiver today.   Review of Systems  Constitutional: Negative.   HENT: Negative.    Eyes: Negative.   Respiratory: Negative.    Cardiovascular: Negative.    Gastrointestinal: Negative.   Endocrine: Negative.   Genitourinary: Negative.   Musculoskeletal: Negative.   Skin: Negative.   Allergic/Immunologic: Negative.   Neurological: Negative.   Hematological: Negative.   Psychiatric/Behavioral: Negative.    All other systems reviewed and are negative.      Objective:   Vitals:   08/08/22 1446  BP: 110/70  Pulse: 100  Resp: 16  Temp: 97.7 F (36.5 C)  TempSrc: Oral  SpO2: 99%  Weight: 179 lb 3.2 oz (81.3 kg)  Height: 6' (1.829 m)    Body mass index is 24.3 kg/m.  Physical Exam Vitals and nursing note reviewed.      Results for orders placed or performed in visit on 07/26/22  Bladder Scan (Post Void Residual) in office  Result Value Ref Range   Scan Result 0        Assessment & Plan:   ***     Danelle Berry, PA-C 08/08/22 3:04 PM

## 2022-08-09 ENCOUNTER — Encounter: Payer: Self-pay | Admitting: Family Medicine

## 2022-08-09 ENCOUNTER — Encounter: Payer: 59 | Admitting: Surgery

## 2022-08-12 ENCOUNTER — Telehealth: Payer: Self-pay | Admitting: Family Medicine

## 2022-08-12 NOTE — Telephone Encounter (Signed)
Please advice  

## 2022-08-12 NOTE — Telephone Encounter (Signed)
Result note has been relayed to patient.

## 2022-08-12 NOTE — Telephone Encounter (Signed)
Copied from [place] [id number]. Topic: General - Other >> Aug 12, 2022  9:04 AM Cyndi Bender wrote: Reason for CRM: Pt request return call to go over x-ray results. Cb# [phone]

## 2022-08-16 ENCOUNTER — Encounter: Payer: 59 | Admitting: Surgery

## 2022-08-30 ENCOUNTER — Ambulatory Visit (INDEPENDENT_AMBULATORY_CARE_PROVIDER_SITE_OTHER): Payer: 59 | Admitting: Surgery

## 2022-08-30 ENCOUNTER — Encounter: Payer: Self-pay | Admitting: Surgery

## 2022-08-30 VITALS — BP 134/86 | HR 85 | Temp 97.6°F | Wt 180.8 lb

## 2022-08-30 DIAGNOSIS — K4021 Bilateral inguinal hernia, without obstruction or gangrene, recurrent: Secondary | ICD-10-CM

## 2022-08-30 DIAGNOSIS — K4091 Unilateral inguinal hernia, without obstruction or gangrene, recurrent: Secondary | ICD-10-CM

## 2022-08-30 DIAGNOSIS — K420 Umbilical hernia with obstruction, without gangrene: Secondary | ICD-10-CM

## 2022-08-30 DIAGNOSIS — Z09 Encounter for follow-up examination after completed treatment for conditions other than malignant neoplasm: Secondary | ICD-10-CM

## 2022-08-30 NOTE — Patient Instructions (Signed)
If you have any concerns or questions, please feel free to call our office.   Laparoscopic Inguinal Hernia Repair, Adult, Care After The following information offers guidance on how to care for yourself after your procedure. Your health care provider may also give you more specific instructions. If you have problems or questions, contact your health care provider. What can I expect after the procedure? After the procedure, it is common to have: Pain. Swelling and bruising around the incision area. Scrotal swelling, in males. Some fluid or blood draining from your incisions. Follow these instructions at home: Medicines Take over-the-counter and prescription medicines only as told by your health care provider. Ask your health care provider if the medicine prescribed to you: Requires you to avoid driving or using machinery. Can cause constipation. You may need to take these actions to prevent or treat constipation: Drink enough fluid to keep your urine pale yellow. Take over-the-counter or prescription medicines. Eat foods that are high in fiber, such as beans, whole grains, and fresh fruits and vegetables. Limit foods that are high in fat and processed sugars, such as fried or sweet foods. Incision care  Follow instructions from your health care provider about how to take care of your incisions. Make sure you: Wash your hands with soap and water for at least 20 seconds before and after you change your bandage (dressing). If soap and water are not available, use hand sanitizer. Change your dressing as told by your health care provider. Leave stitches (sutures), skin glue, or adhesive strips in place. These skin closures may need to stay in place for 2 weeks or longer. If adhesive strip edges start to loosen and curl up, you may trim the loose edges. Do not remove adhesive strips completely unless your health care provider tells you to do that. Check your incision area every day for signs of  infection. Check for: More redness, swelling, or pain. More fluid or blood. Warmth. Pus or a bad smell. Wear loose, soft clothing while your incisions heal. Managing pain and swelling If directed, put ice on the painful or swollen areas. To do this: Put ice in a plastic bag. Place a towel between your skin and the bag. Leave the ice on for 20 minutes, 2-3 times a day. Remove the ice if your skin turns bright red. This is very important. If you cannot feel pain, heat, or cold, you have a greater risk of damage to the area.  Activity Do not lift anything that is heavier than 10 lb (4.5 kg), or the limit that you are told, until your health care provider says that it is safe. Ask your health care provider what activities are safe for you. A lot of activity during the first week after surgery can increase pain and swelling. For 1 week after your procedure: Avoid activities that take a lot of effort, such as exercise or sports. You may walk and climb stairs as needed for daily activity, but avoid long walks or climbing stairs for exercise. General instructions If you were given a sedative during the procedure, it can affect you for several hours. Do not drive or operate machinery until your health care provider says that it is safe. Do not take baths, swim, or use a hot tub until your health care provider approves. Ask your health care provider if you may take showers. You may only be allowed to take sponge baths. Do not use any products that contain nicotine or tobacco. These products include cigarettes,  chewing tobacco, and vaping devices, such as e-cigarettes. If you need help quitting, ask your health care provider. Keep all follow-up visits. This is important. Contact a health care provider if: You have any of these signs of infection: More redness, swelling, or pain around your incisions or your groin area. More fluid or blood coming from an incision. Warmth coming from an incision. Pus  or a bad smell coming from an incision. A fever or chills. You have more swelling in your scrotum, if you are male. You have severe pain and medicines do not help. You have abdominal pain or swelling. You cannot urinate or have a bowel movement. You faint or feel dizzy. You have nausea and vomiting. Get help right away if: You have redness, warmth, or pain in your leg. You have chest pain. You have problems breathing. These symptoms may represent a serious problem that is an emergency. Do not wait to see if the symptoms will go away. Get medical help right away. Call your local emergency services (911 in the [place] Do not drive yourself to the hospital. Summary Pain, swelling, and bruising are common after the procedure. Check your incision area every day for signs of infection, such as more redness, swelling, or pain. Put ice on painful or swollen areas for 20 minutes, 2-3 times a day. This information is not intended to replace advice given to you by your health care provider. Make sure you discuss any questions you have with your health care provider. Document Revised: 07/11/2020 Document Reviewed: 07/11/2020 Elsevier Patient Education  2023 Elsevier Inc.  

## 2022-08-30 NOTE — Progress Notes (Signed)
08/30/2022  HPI: Joseph Strong is a 51 y.o. male s/p robotic assisted bilateral inguinal hernia repair and open umbilical hernia repair with mesh on 07/02/2022.  Patient presents today for follow-up.  Patient reports that he still having some pain or discomfort in the periumbilical region.  He also reports that depending how he is moving, he may feel discomfort on the sides of his abdomen and groin areas.  He denies any bulging sensation.  He has been seeing neurology for the left thigh numbness as well as orthopedics.  He had a lumbar MRI and is now pending EMG of the lower extremity.  Vital signs: BP 134/86   Pulse 85   Temp 97.6 F (36.4 C) (Oral)   Wt 180 lb 12.8 oz (82 kg)   SpO2 100%   BMI 24.52 kg/m    Physical Exam: Constitutional: No acute distress Abdomen: Soft, nondistended, with some soreness to palpation in the periumbilical region at the site of his most recent hernia repair.  There is no evidence of any hernia recurrence at the umbilicus or either groin.  All incisions are otherwise well-healed.  Assessment/Plan: This is a 51 y.o. male s/p robotic assisted bilateral inguinal hernia repair and open umbilical hernia repair.  - Discussed with patient that the discomfort he is having is likely related to scar tissue as everything is healing.  Unfortunately now 2 months out from surgery, I do not know that there is currently much improvement in the scar tissue or his symptoms.  He did have redo surgery in all 3 areas - From a surgical standpoint, I do not have any activity restrictions as he is beyond 6 weeks from his surgery.  He is currently pending EMG of the lower extremities and will be followed by orthopedics. - Follow-up as needed.   Melvyn Neth, [facility]

## 2022-09-05 DIAGNOSIS — M5416 Radiculopathy, lumbar region: Secondary | ICD-10-CM | POA: Insufficient documentation

## 2022-09-13 ENCOUNTER — Ambulatory Visit: Payer: Self-pay

## 2022-09-13 NOTE — Telephone Encounter (Signed)
  Chief Complaint: Left leg numbness, groin pain Symptoms: ibid Frequency: since sx in sept Pertinent Negatives: Patient denies inability to urinate or defecate Disposition: [] ED /[] Urgent Care (no appt availability in office) / [x] Appointment(In office/virtual)/ []  [facility]/ [] Home Care/ [] Refused Recommended Disposition /[] [place] [facility]/ []  Follow-up with PCP Additional Notes: Pt states that this is an ongoing issue that has not been resolved, despite visits to ortho and neuro.    Reason for Disposition  [1] Numbness or tingling in one or both feet AND [2] is a chronic symptom (recurrent or ongoing AND present > 4 weeks)  Answer Assessment - Initial Assessment Questions 1. SYMPTOM: "What is the main symptom you are concerned about?" (e.g., weakness, numbness)     Right leg 2. ONSET: "When did this start?" (minutes, hours, days; while sleeping)     After surgery 3. LAST NORMAL: "When was the last time you (the patient) were normal (no symptoms)?"     Before sx 4. PATTERN "Does this come and go, or has it been constant since it started?"  "Is it present now?"     constant 5. CARDIAC SYMPTOMS: "Have you had any of the following symptoms: chest pain, difficulty breathing, palpitations?"      6. NEUROLOGIC SYMPTOMS: "Have you had any of the following symptoms: headache, dizziness, vision loss, double vision, changes in spech, unsteady on your feet?"      7. OTHER SYMPTOMS: "Do you have any other symptoms?"      8. PREGNANCY: "Is there any chance you are pregnant?" "When was your last menstrual period?"  Protocols used: Neurologic Deficit-A-AH

## 2022-09-14 ENCOUNTER — Other Ambulatory Visit: Payer: Self-pay

## 2022-09-14 DIAGNOSIS — M79602 Pain in left arm: Secondary | ICD-10-CM | POA: Diagnosis not present

## 2022-09-14 DIAGNOSIS — M25512 Pain in left shoulder: Secondary | ICD-10-CM | POA: Diagnosis not present

## 2022-09-14 DIAGNOSIS — R202 Paresthesia of skin: Secondary | ICD-10-CM | POA: Diagnosis present

## 2022-09-14 NOTE — ED Triage Notes (Signed)
Pt arrives with c/o left arm pain and left thigh pain that started about a year ago. Per pt, he has some intermittent tingling in arm and thigh.

## 2022-09-15 ENCOUNTER — Emergency Department
Admission: EM | Admit: 2022-09-15 | Discharge: 2022-09-15 | Disposition: A | Discharge status: Home / Self Care | Payer: 59 | Attending: Emergency Medicine | Admitting: Emergency Medicine

## 2022-09-15 DIAGNOSIS — R202 Paresthesia of skin: Secondary | ICD-10-CM

## 2022-09-15 NOTE — ED Provider Notes (Signed)
   [facility] Provider Note    Event Date/Time   First MD Initiated Contact with Patient 09/15/22 0115     (approximate)   History   Arm Pain   HPI  Joseph Strong is a 50 y.o. male who presents to the ED for evaluation of Arm Pain   Patient presents to the ED for evaluation of chronic left arm pain and paresthesias to his left fingers.  Reports months-years of the symptoms.  Sometimes feels cramping pain to his left posterior shoulder that is atraumatic.  Reports left thigh tingling for years after hernia surgery.  Cannot really elaborate why he came in tonight acutely.   Physical Exam   Triage Vital Signs: ED Triage Vitals  Enc Vitals Group     BP 09/14/22 2351 (!) 145/102     Pulse Rate 09/14/22 2351 84     Resp 09/14/22 2351 18     Temp 09/14/22 2351 97.9 F (36.6 C)     Temp Source 09/14/22 2351 Oral     SpO2 09/14/22 2351 100 %     Weight 09/14/22 2350 180 lb (81.6 kg)     Height --      Head Circumference --      Peak Flow --      Pain Score 09/14/22 2350 0     Pain Loc --      Pain Edu? --      Excl. in [place]? --     Most recent vital signs: Vitals:   09/14/22 2351 09/15/22 0157  BP: (!) 145/102 115/82  Pulse: 84 72  Resp: 18 17  Temp: 97.9 F (36.6 C)   SpO2: 100% 97%    General: Awake, no distress.  Quite loquacious.  Not manic CV:  Good peripheral perfusion.  Resp:  Normal effort.  Abd:  No distention.  MSK:  No deformity noted.  Some mild tenderness to the left trapezius musculature without overlying skin changes or signs of trauma. Neuro:  No focal deficits appreciated. Cranial nerves II through XII intact 5/5 strength and sensation in all 4 extremities Other:     ED Results / Procedures / Treatments   Labs (all labs ordered are listed, but only abnormal results are displayed) Labs Reviewed - No data to display  EKG   RADIOLOGY   Official radiology report(s): No results found.  PROCEDURES and  INTERVENTIONS:  Procedures  Medications - No data to display   IMPRESSION / MDM / [place] / ED COURSE  I reviewed the triage vital signs and the nursing notes.  50 year old male presents to the ED with chronic paresthesias to his left hand in the setting of a possible trapezius muscular spasm.  Looks well without signs of neurologic or vascular deficits.  No signs of trauma, rash or acute illness.  Normal vital signs.  I see no indications for diagnostics.  Discussed clinic follow-up and return precautions.       FINAL CLINICAL IMPRESSION(S) / ED DIAGNOSES   Final diagnoses:  Paresthesia of left arm     Rx / DC Orders   ED Discharge Orders     None        Note:  This document was prepared using Dragon voice recognition software and may include unintentional dictation errors.   Vladimir Crofts, MD 09/15/22 [phone]

## 2022-09-15 NOTE — Discharge Instructions (Signed)
Please take Tylenol and ibuprofen/Advil for your pain.  It is safe to take them together, or to alternate them every few hours.  Take up to 1000mg of Tylenol at a time, up to 4 times per day.  Do not take more than 4000 mg of Tylenol in 24 hours.  For ibuprofen, take 400-600 mg, 3 - 4 times per day.  

## 2022-09-16 ENCOUNTER — Encounter: Payer: Self-pay | Admitting: Family Medicine

## 2022-09-16 ENCOUNTER — Ambulatory Visit (INDEPENDENT_AMBULATORY_CARE_PROVIDER_SITE_OTHER): Payer: 59 | Admitting: Family Medicine

## 2022-09-16 VITALS — BP 128/70 | HR 89 | Temp 98.3°F | Resp 16 | Ht 72.0 in | Wt 184.9 lb

## 2022-09-16 DIAGNOSIS — R200 Anesthesia of skin: Secondary | ICD-10-CM

## 2022-09-16 DIAGNOSIS — Z09 Encounter for follow-up examination after completed treatment for conditions other than malignant neoplasm: Secondary | ICD-10-CM

## 2022-09-16 DIAGNOSIS — M5416 Radiculopathy, lumbar region: Secondary | ICD-10-CM | POA: Diagnosis not present

## 2022-09-16 DIAGNOSIS — M79672 Pain in left foot: Secondary | ICD-10-CM

## 2022-09-16 MED ORDER — MELOXICAM 15 MG PO TABS: 15.0000 mg | ORAL_TABLET | Freq: Every day | ORAL | 2 refills | Status: DC | PRN

## 2022-09-16 NOTE — Progress Notes (Signed)
Patient ID: Joseph Strong, male    DOB: July 27, 1971, 51 y.o.   MRN: [id number]  PCP: Delsa Grana, PA-C  Chief Complaint  Patient presents with   Follow-up   Numbness    Left Leg   Pain    Groin area    Subjective:   Joseph Strong is a 51 y.o. male, presents to clinic with CC of the following:  HPI   Pt presents for the same sx as the past several OV - for which he is seeing multiple specialists - ortho, neuro and general surgery  Neuro visit - 09/04/2022 - Continue to follow up with [facility] for MRI lumbar spine follow-up and nerve testing. Recommend asking if [facility] will order MRI pelvis.  - Recommend asking general surgeon about MRI of the pelvis and evaluation of cause of meralgia paresthetica.  - Start nortriptyline 10 mg at night for one week, then increase to 20 mg at night for numbness and pain.   Follow up with Dr. Melrose Strong in 6-8 weeks.     General surgery - [place] - 08/30/2022 Joseph Strong is a 51 y.o. male s/p robotic assisted bilateral inguinal hernia repair and open umbilical hernia repair with mesh on 07/02/2022.  Patient presents today for follow-up.  Patient reports that he still having some pain or discomfort in the periumbilical region.  He also reports that depending how he is moving, he may feel discomfort on the sides of his abdomen and groin areas.  He denies any bulging sensation.  He has been seeing neurology for the left thigh numbness as well as orthopedics.  He had a lumbar MRI and is now pending EMG of the lower extremity. Assessment/Plan: This is a 51 y.o. male s/p robotic assisted bilateral inguinal hernia repair and open umbilical hernia repair.   - Discussed with patient that the discomfort he is having is likely related to scar tissue as everything is healing.  Unfortunately now 2 months out from surgery, I do not know that there is currently much improvement in the scar tissue or his symptoms.  He did have redo surgery in all 3 areas -  From a surgical standpoint, I do not have any activity restrictions as he is beyond 6 weeks from his surgery.  He is currently pending EMG of the lower extremities and will be followed by orthopedics. - Follow-up as needed.     Melvyn Neth, MD [facility]   Ortho - Dr. Kayleen Memos - [facility] cannot see records  ER visit yesterday for left arm pain and paresthesias, left posterior shoulder cramping and left thigh tingling 51 year old male presents to the ED with chronic paresthesias to his left hand in the setting of a possible trapezius muscular spasm.  Looks well without signs of neurologic or vascular deficits.  No signs of trauma, rash or acute illness.  Normal vital signs.  I see no indications for diagnostics.  Discussed clinic follow-up and return precautions.  He says MRI didn't show anything on the left side/left lumbar spine, the meds he was given had too many side effects He also complains of left plantar foot and heel pain x 2 d    Patient Active Problem List   Diagnosis Date Noted   Recurrent umbilical hernia with incarceration    Bilateral recurrent inguinal hernia without obstruction or gangrene    Chronic pain syndrome 03/04/2021   Encounter for screening colonoscopy 03/04/2021   Disorder of skeletal system 03/04/2021   Problems influencing health status  03/04/2021   Pancreas divisum 10/09/2018   Constipation 10/09/2018   Ventral hernia without obstruction or gangrene 10/09/2018   Prostate cancer screening 09/16/2017   Abdominal pain, periumbilical 47/65/4650   Decreased frequency of bowel movements 01/10/2016   Paraspinal back pain (Left) 12/08/2015   Dizziness, nonspecific 10/31/2015   Abnormal TSH 10/31/2015   Chronic groin pain (Left) 10/31/2015   Abnormal EKG 10/31/2015   Testicular pain (Left) 09/20/2015   S/P unilateral inguinal hernia repair 09/20/2015      Current Outpatient Medications:    acetaminophen (TYLENOL) 500 MG tablet,  Take 2 tablets (1,000 mg total) by mouth every 6 (six) hours as needed for mild pain., Disp: , Rfl:    ibuprofen (ADVIL) 800 MG tablet, Take 1 tablet (800 mg total) by mouth every 8 (eight) hours as needed for moderate pain., Disp: 60 tablet, Rfl: 1   Multiple Vitamin (MULTIVITAMIN) tablet, Take 1 tablet by mouth daily., Disp: , Rfl:    tadalafil (CIALIS) 5 MG tablet, TAKE ONE TABLET BY MOUTH DAILY AS NEEDED FOR ERECTILE DYSFUNCTION, Disp: 90 tablet, Rfl: 3   No Known Allergies   Social History   Tobacco Use   Smoking status: Some Days    Types: Cigars    Passive exposure: Past   Smokeless tobacco: Never  Vaping Use   Vaping Use: Never used  Substance Use Topics   Alcohol use: Yes    Alcohol/week: 0.0 standard drinks of alcohol    Comment: occasional   Drug use: No      Chart Review Today: I personally reviewed active problem list, medication list, allergies, family history, social history, health maintenance, notes from last encounter, lab results, imaging with the patient/caregiver today.   Review of Systems  Constitutional: Negative.   HENT: Negative.    Eyes: Negative.   Respiratory: Negative.    Cardiovascular: Negative.   Gastrointestinal: Negative.   Endocrine: Negative.   Genitourinary: Negative.   Musculoskeletal: Negative.   Skin: Negative.   Allergic/Immunologic: Negative.   Neurological: Negative.   Hematological: Negative.   Psychiatric/Behavioral: Negative.    All other systems reviewed and are negative.      Objective:   Vitals:   09/16/22 0924  BP: 128/70  Pulse: 89  Resp: 16  Temp: 98.3 F (36.8 C)  TempSrc: Oral  SpO2: 98%  Weight: 184 lb 14.4 oz (83.9 kg)  Height: 6' (1.829 m)    Body mass index is 25.08 kg/m.  Physical Exam Vitals and nursing note reviewed.  Constitutional:      Appearance: He is well-developed.  HENT:     Head: Normocephalic and atraumatic.     Nose: Nose normal.  Eyes:     General:        Right eye: No  discharge.        Left eye: No discharge.     Conjunctiva/sclera: Conjunctivae normal.  Neck:     Trachea: No tracheal deviation.  Cardiovascular:     Rate and Rhythm: Normal rate and regular rhythm.  Pulmonary:     Effort: Pulmonary effort is normal. No respiratory distress.     Breath sounds: No stridor.  Musculoskeletal:        General: Normal range of motion.  Skin:    General: Skin is warm and dry.     Findings: No rash.  Neurological:     Mental Status: He is alert.     Motor: No abnormal muscle tone.     Coordination: Coordination normal.  Comments: Antalgic gait walking out of the exam room, but normal gait when observed walking out of clinic in the parking lot to his car  Psychiatric:        Attention and Perception: Attention normal.        Mood and Affect: Mood and affect normal.        Speech: Speech is not rapid and pressured.        Behavior: Behavior is cooperative.      Results for orders placed or performed in visit on 07/26/22  Bladder Scan (Post Void Residual) in office  Result Value Ref Range   Scan Result 0        Assessment & Plan:     ICD-10-CM   1. Numbness of left anterior thigh  R20.0 Ambulatory referral to Neurology   no change to sx, prior OV and examination done multiple times, reviewed recent specialist OV, A&P, reviewed all meds and process with pt today    2. Lumbar radiculopathy  M54.16 Ambulatory referral to Neurology   per ortho/neuro, we will request MRI and ortho records, not visible    3. Left foot pain  M79.672 Ambulatory referral to Orthopedics   mentioned at the end of visit, offered mobic or ortho vs podiatry referral - not examined nor addressed since his appt was not for this     4. Encounter for examination following treatment at hospital  Z09    ER visit records reviewed and complaints f/up on today     Pt again referred to [facility] nearing an end and returning to work with symptoms and I again explained that current  multiple specialists have not determined anything medically preventing him from returning to work and I cannot help him with this.  I encouraged him to return to work and save some FMLA time to allow him to continue to f/up with the specialists since getting a neuropathy/paresthesia dx is often a process.   He mentioned a new complaint at the end of the visit - he limped out of the exam room and asked for a work note and stated his foot hurt so bad he's "gonna need a wheel chair today... its that kinda day"  He was observed a few minutes later walking out of the building with normal gait - suspect malingering     Delsa Grana, PA-C 09/16/22 9:28 AM

## 2022-09-16 NOTE — Patient Instructions (Addendum)
Please continue to follow plan and instructions from the specialists for your current problems and symptoms - left shoulder pain/numbness/spasms, and left thigh numbness, and groin pain.  I have put in a new neuro referral for a second opinion.  I do suggest you follow up with Dr. Melrose Nakayama with all the results and records from ortho so that he can review and tell you next steps.  [facility] [place], [place] [address] Phone: [phone] [email or web address]  Go to [facility] for your foot if it does not improve with meloxicam (do not take other NSAIDs in the same day aleve naproxen or ibuprofen) you can take same day with tylenol for your symptoms

## 2022-09-17 ENCOUNTER — Emergency Department
Admission: EM | Admit: 2022-09-17 | Discharge: 2022-09-17 | Disposition: A | Discharge status: Home / Self Care | Payer: 59 | Attending: Emergency Medicine | Admitting: Emergency Medicine

## 2022-09-17 ENCOUNTER — Other Ambulatory Visit: Payer: Self-pay

## 2022-09-17 ENCOUNTER — Encounter: Payer: Self-pay | Admitting: Emergency Medicine

## 2022-09-17 DIAGNOSIS — M79605 Pain in left leg: Secondary | ICD-10-CM | POA: Diagnosis present

## 2022-09-17 DIAGNOSIS — M5410 Radiculopathy, site unspecified: Secondary | ICD-10-CM | POA: Diagnosis not present

## 2022-09-17 MED ORDER — PREDNISONE 10 MG PO TABS: 10.0000 mg | ORAL_TABLET | ORAL | 0 refills | Status: DC

## 2022-09-17 MED ORDER — NAPROXEN 500 MG PO TABS: 500.0000 mg | ORAL_TABLET | Freq: Two times a day (BID) | ORAL | 2 refills | Status: DC

## 2022-09-17 NOTE — ED Provider Notes (Signed)
[facility] Provider Note  Patient Contact: 8:36 PM (approximate)   History   Leg Pain and Foot Pain   HPI  Joseph Strong is a 51 y.o. male who presents the emergency department with ongoing symptoms in his left leg.  Patient states that he has had some issues with pain in the left thigh region.  Patient states that this started after he had a hernia surgery.  He has had multiple hernias, has had revisions on his mesh multiple times.  States that since his last surgery in August he has been having difficulties with his leg.  He is seen orthopedics and neurology and up to this point MRIs and EMG has been reassuring.  Patient states that he has tried multiple medications including gabapentin, muscle relaxers, anti-inflammatories.  Patient states that he has had no success in relieving his symptoms.  Patient states that at this time he feels like he needs a second referral to another health system.  He is currently also using FMLA due to his symptoms.     Physical Exam   Triage Vital Signs: ED Triage Vitals [09/17/22 1749]  Enc Vitals Group     BP (!) 140/101     Pulse Rate 97     Resp 20     Temp 98.4 F (36.9 C)     Temp Source Oral     SpO2 99 %     Weight      Height      Head Circumference      Peak Flow      Pain Score 10     Pain Loc      Pain Edu?      Excl. in [place]?     Most recent vital signs: Vitals:   09/17/22 1749  BP: (!) 140/101  Pulse: 97  Resp: 20  Temp: 98.4 F (36.9 C)  SpO2: 99%     General: Alert and in no acute distress.   Cardiovascular:  Good peripheral perfusion Respiratory: Normal respiratory effort without tachypnea or retractions. Lungs CTAB.  Musculoskeletal: Full range of motion to all extremities.  No visible erythema, edema, abnormality to the left lower extremity.  Good range of motion, patient is still ambulatory.  Pulse, sensation intact. Neurologic:  No gross focal neurologic deficits are appreciated.   Skin:   No rash noted Other:   ED Results / Procedures / Treatments   Labs (all labs ordered are listed, but only abnormal results are displayed) Labs Reviewed - No data to display   EKG    RADIOLOGY    No results found.  PROCEDURES:  Critical Care performed: No  Procedures   MEDICATIONS ORDERED IN ED: Medications - No data to display   IMPRESSION / MDM / [place] / ED COURSE  I reviewed the triage vital signs and the nursing notes.                              Differential diagnosis includes, but is not limited to, lumbar radiculopathy, sciatica, DVT, muscle ischemia  Patient's presentation is most consistent with acute presentation with potential threat to life or bodily function.   Patient's diagnosis is consistent with leg pain.  Patient presents emergency department complaining of ongoing symptoms to the left leg.  He has been seen by orthopedics and neurology having had MRIs and EMGs.  Patient states that they have not been able to  find a reason for his pain but this began after having hernia repair in the left groin.  Patient has tried multiple medications including gabapentin, anti-inflammatories muscle relaxers and has had side effects on multiple medications.  I will attempt a steroid taper followed by an anti-inflammatory.  Patient with no concerning symptoms warranting further imaging tonight.  Patient is advised that if symptoms are ongoing he can always seek a second opinion with another neurologist.  Patient is agreeable with this plan..  Patient is given ED precautions to return to the ED for any worsening or new symptoms.        FINAL CLINICAL IMPRESSION(S) / ED DIAGNOSES   Final diagnoses:  Left leg pain  Radicular pain     Rx / DC Orders   ED Discharge Orders          Ordered    predniSONE (DELTASONE) 10 MG tablet  As directed       Note to Pharmacy: Take on a pattern of 6, 6, 5, 5, 4, 4, 3, 3, 2, 2, 1, 1   09/17/22 2041     naproxen (NAPROSYN) 500 MG tablet  2 times daily with meals        09/17/22 2041             Note:  This document was prepared using Dragon voice recognition software and may include unintentional dictation errors.   Lanette [place] 09/17/22 2042    Merwyn Katos, MD 09/17/22 [phone]

## 2022-09-17 NOTE — ED Triage Notes (Signed)
Pt via POV from home. Pt c/o L leg pain, L foot pain. States pain is in this L thigh and arch of his foot since his hernia surgery in Aug. Pt is A&Ox4 and NAD. Ambulatory to triage.

## 2022-09-17 NOTE — ED Notes (Signed)
Pt verbalized understanding of Dc instructions. Signing pad did not work.

## 2022-09-20 ENCOUNTER — Ambulatory Visit: Payer: 59 | Admitting: Internal Medicine

## 2022-09-20 ENCOUNTER — Encounter: Payer: Self-pay | Admitting: Internal Medicine

## 2022-09-20 VITALS — BP 112/72 | HR 104 | Temp 98.4°F | Resp 18 | Ht 72.0 in | Wt 184.4 lb

## 2022-09-20 DIAGNOSIS — R200 Anesthesia of skin: Secondary | ICD-10-CM | POA: Diagnosis not present

## 2022-09-20 DIAGNOSIS — E559 Vitamin D deficiency, unspecified: Secondary | ICD-10-CM

## 2022-09-20 DIAGNOSIS — R7989 Other specified abnormal findings of blood chemistry: Secondary | ICD-10-CM | POA: Diagnosis not present

## 2022-09-20 MED ORDER — DULOXETINE HCL 20 MG PO CPEP: 20.0000 mg | ORAL_CAPSULE | Freq: Every day | ORAL | 1 refills | Status: DC

## 2022-09-20 NOTE — Progress Notes (Signed)
Established Patient Office Visit  Subjective   Patient ID: Joseph Strong, male    DOB: 10-21-1971  Age: 51 y.o. MRN: [id number]  Chief Complaint  Patient presents with   Numbness    Left leg since hernia surgery in Aug   Benign Prostatic Hypertrophy    Having pressure and weak stream    HPI  Patient is here to discuss numbness in his left leg. Underwent robotic assisted bilateral inguinal hernia repair and open umbilical hernia repair with mesh on 07/02/2022.  Patient states ever since this time he has been having numbness and a 1 x 2 inch diameter region on his left anterior lateral thigh.  He states the pain is a nerve type pain as well as a stabbing pain accompanied with muscle spasms.  He is also having pain in his left groin as well.  He has been seen for this issue at this office multiple times as well as several specialist.  He was seen by his general surgeon on 08/30/2022.  At that time he also reported pain in the periumbilical region as well as left-sided numbness.  He was told by general surgery that his discomfort is likely related to scar tissue and from a surgical standpoint he does not have any activity restrictions.  He was also seen by orthopedics at [facility] on 09/13/2022.  MRI of the lumbar spine was negative and nothing that would explain symptoms for hinder his ability to return to work.  EMG was negative. He was referred rehab medicine.  He was also seen by neurology, note reviewed from 09/04/2022 for the same symptoms.  He was given gabapentin, which the patient cannot take because it causes dizziness.  Lyrica was also tried but it was too expensive.  He was also treated with nortriptyline, which he did not complete.  He was recently seen in the ER on 09/17/2022 for the same symptoms.  He was given Mobic, which the patient states he cannot take due to side effects.  He was also given prednisone, which the patient reports today he only took 1 dose of it and it caused him to be  "jittery".    NUMBNESS Duration:  Since his surgery in August Onset: sudden Location: left anterior/lateral thigh Bilateral: no Symmetric: no Decreased sensation: yes  Weakness: yes Pain: yes Quality:  sharp, burning, and stabbing Severity: severe  Frequency: constant Trauma: yes - surgery Recent illness: no Diabetes: no HIV: no  Alcoholism: no  Alleviating factors: Nothing Aggravating factors: Nothing  Status: worse Treatments attempted: Mobic, naproxen, prednisone, gabapentin, Lyrica, nortriptyline.   Patient Active Problem List   Diagnosis Date Noted   Lumbar radiculopathy 09/05/2022   Recurrent umbilical hernia with incarceration    Bilateral recurrent inguinal hernia without obstruction or gangrene    Chronic pain syndrome 03/04/2021   Encounter for screening colonoscopy 03/04/2021   Disorder of skeletal system 03/04/2021   Problems influencing health status 03/04/2021   Pancreas divisum 10/09/2018   Constipation 10/09/2018   Ventral hernia without obstruction or gangrene 10/09/2018   Prostate cancer screening 09/16/2017   Abdominal pain, periumbilical 85/12/7739   Decreased frequency of bowel movements 01/10/2016   Paraspinal back pain (Left) 12/08/2015   Dizziness, nonspecific 10/31/2015   Abnormal TSH 10/31/2015   Chronic groin pain (Left) 10/31/2015   Abnormal EKG 10/31/2015   Testicular pain (Left) 09/20/2015   S/P unilateral inguinal hernia repair 09/20/2015   Past Medical History:  Diagnosis Date   BPH (benign prostatic hyperplasia)  Burning with urination 01/10/2016   Constipation    Dysrhythmia    "OCCASSIONAL SPEEDING UP OF HEART RATE" PT THINKING IT MAY BE DUE TO HERNIA   Enlarged prostate    Headache    MIGRAINES   Inguinal hernia    left    Scoliosis    Past Surgical History:  Procedure Laterality Date   COLONOSCOPY WITH PROPOFOL N/A 05/10/2022   Procedure: COLONOSCOPY WITH PROPOFOL;  Surgeon: Lin Landsman, MD;  Location:  [facility];  Service: Gastroenterology;  Laterality: N/A;   HERNIA REPAIR Left [id number]   AND UMBILICAL   INGUINAL HERNIA REPAIR Right 01/11/2016   Procedure: HERNIA REPAIR INGUINAL ADULT;  Surgeon: Christene Lye, MD;  Location: [facility] ORS;  Service: General;  Laterality: Right;   INSERTION OF MESH  07/02/2022   Procedure: INSERTION OF MESH- UMBILICAL;  Surgeon: Olean Ree, MD;  Location: [facility] ORS;  Service: General;;   TONSILLECTOMY     childhood   UMBILICAL HERNIA REPAIR     infant   UMBILICAL HERNIA REPAIR N/A 07/02/2022   Procedure: HERNIA REPAIR UMBILICAL ADULT open, recurrent; incarcerated;  Surgeon: Olean Ree, MD;  Location: [facility] ORS;  Service: General;  Laterality: N/A;   XI ROBOTIC ASSISTED INGUINAL HERNIA REPAIR WITH MESH Bilateral 07/02/2022   Procedure: XI ROBOTIC ASSISTED INGUINAL HERNIA WITH MESH, recurrent;  Surgeon: Olean Ree, MD;  Location: [facility] ORS;  Service: General;  Laterality: Bilateral;   Social History   Tobacco Use   Smoking status: Some Days    Types: Cigars    Passive exposure: Past   Smokeless tobacco: Never  Vaping Use   Vaping Use: Never used  Substance Use Topics   Alcohol use: Yes    Alcohol/week: 0.0 standard drinks of alcohol    Comment: occasional   Drug use: No   Social History   Socioeconomic History   Marital status: Single    Spouse name: Not on file   Number of children: 1   Years of education: 11   Highest education level: 11th grade  Occupational History   Not on file  Tobacco Use   Smoking status: Some Days    Types: Cigars    Passive exposure: Past   Smokeless tobacco: Never  Vaping Use   Vaping Use: Never used  Substance and Sexual Activity   Alcohol use: Yes    Alcohol/week: 0.0 standard drinks of alcohol    Comment: occasional   Drug use: No   Sexual activity: Yes  Other Topics Concern   Not on file  Social History Narrative   Not on file   Social Determinants of Health   Financial Resource Strain:  Low Risk  (02/21/2022)   Overall Financial Resource Strain (CARDIA)    Difficulty of Paying Living Expenses: Not hard at all  Food Insecurity: No Food Insecurity (02/21/2022)   Hunger Vital Sign    Worried About Running Out of Food in the Last Year: Never true    Ran Out of Food in the Last Year: Never true  Transportation Needs: No Transportation Needs (02/21/2022)   PRAPARE - Hydrologist (Medical): No    Lack of Transportation (Non-Medical): No  Physical Activity: Insufficiently Active (02/21/2022)   Exercise Vital Sign    Days of Exercise per Week: 3 days    Minutes of Exercise per Session: 40 min  Stress: No Stress Concern Present (02/21/2022)   [place]  Feeling of Stress : Not at all  Social Connections: Moderately Integrated (02/21/2022)   Social Connection and Isolation Panel [NHANES]    Frequency of Communication with Friends and Family: More than three times a week    Frequency of Social Gatherings with Friends and Family: More than three times a week    Attends Religious Services: More than 4 times per year    Active Member of [employer] or Organizations: Yes    Attends Music therapist: More than 4 times per year    Marital Status: Never married  Intimate Partner Violence: Not At Risk (02/21/2022)   Humiliation, Afraid, Rape, and Kick questionnaire    Fear of Current or Ex-Partner: No    Emotionally Abused: No    Physically Abused: No    Sexually Abused: No   Family Status  Relation Name Status   Mother  Deceased   Father  Deceased   MGM  Alive   Neg Hx  (Not Specified)   Family History  Problem Relation Age of Onset   Hypertension Maternal Grandmother    Prostate cancer Neg Hx    Kidney cancer Neg Hx    Bladder Cancer Neg Hx    No Known Allergies    Review of Systems  Neurological:  Positive for sensory change and weakness.      Objective:     BP  112/72   Pulse (!) 104   Temp 98.4 F (36.9 C)   Resp 18   Ht 6' (1.829 m)   Wt 184 lb 6.4 oz (83.6 kg)   SpO2 99%   BMI 25.01 kg/m  BP Readings from Last 3 Encounters:  09/20/22 112/72  09/17/22 (!) 140/101  09/16/22 128/70   Wt Readings from Last 3 Encounters:  09/20/22 184 lb 6.4 oz (83.6 kg)  09/16/22 184 lb 14.4 oz (83.9 kg)  09/14/22 180 lb (81.6 kg)      Physical Exam Constitutional:      Appearance: Normal appearance.  HENT:     Head: Normocephalic and atraumatic.     Mouth/Throat:     Mouth: Mucous membranes are moist.     Pharynx: Oropharynx is clear.  Eyes:     Extraocular Movements: Extraocular movements intact.     Conjunctiva/sclera: Conjunctivae normal.     Pupils: Pupils are equal, round, and reactive to light.  Cardiovascular:     Rate and Rhythm: Normal rate and regular rhythm.  Pulmonary:     Effort: Pulmonary effort is normal.     Breath sounds: Normal breath sounds.  Musculoskeletal:     Right lower leg: No edema.     Left lower leg: No edema.  Skin:    General: Skin is warm and dry.  Neurological:     General: No focal deficit present.     Mental Status: He is alert. Mental status is at baseline.     Cranial Nerves: No cranial nerve deficit.     Sensory: Sensory deficit present.     Motor: No weakness.     Coordination: Coordination normal.     Gait: Gait normal.     Deep Tendon Reflexes: Reflexes normal.     Comments: Precieved sensory deficit on anterior lateral aspect of left thigh   Psychiatric:        Mood and Affect: Mood normal.        Behavior: Behavior normal.      No results found for any visits on 09/20/22.  Last CBC  Lab Results  Component Value Date   WBC 5.9 02/21/2022   HGB 15.7 02/21/2022   HCT 47.9 02/21/2022   MCV 86.6 02/21/2022   MCH 28.4 02/21/2022   RDW 12.3 02/21/2022   PLT 244 94/76/5465   Last metabolic panel Lab Results  Component Value Date   GLUCOSE 89 02/21/2022   NA 137 02/21/2022   K 4.1  02/21/2022   CL 105 02/21/2022   CO2 23 02/21/2022   BUN 17 02/21/2022   CREATININE 1.22 02/21/2022   EGFR 72 02/21/2022   CALCIUM 8.9 02/21/2022   PROT 7.0 02/21/2022   ALBUMIN 3.6 02/25/2021   LABGLOB 2.8 07/10/2017   AGRATIO 1.5 07/10/2017   BILITOT 0.3 02/21/2022   ALKPHOS 34 (L) 02/25/2021   AST 19 02/21/2022   ALT 13 02/21/2022   ANIONGAP 8 05/19/2021   Last lipids Lab Results  Component Value Date   CHOL 161 02/21/2022   HDL 50 02/21/2022   LDLCALC 94 02/21/2022   TRIG 78 02/21/2022   CHOLHDL 3.2 02/21/2022   Last hemoglobin A1c Lab Results  Component Value Date   HGBA1C 5.5 02/21/2022   Last thyroid functions Lab Results  Component Value Date   TSH 0.42 04/26/2022   T4TOTAL 7.3 04/26/2022   Last vitamin D Lab Results  Component Value Date   VD25OH 32 12/01/2017   Last vitamin B12 and Folate No results found for: "VITAMINB12", "FOLATE"    The 10-year ASCVD risk score (Arnett DK, et al., 2019) is: 7%    Assessment & Plan:   1. Numbness of left anterior thigh: General surgery note from 08/30/2022 reviewed.  [facility] note from 09/13/2022 reviewed as well as results of the MRI of the lumbar spine and EMG done by [facility].  Neurology note from 09/04/2022 reviewed as well as last note from this office reviewed.  Patient has been trialed on gabapentin, Lyrica, multiple anti-inflammatories and steroids, all of which she cannot take due to various side effects.  I will try him on low-dose Cymbalta 20 mg.  I discussed with the patient that if this medication does not work then we are virtually out of options.  His physical exam in the office today was without abnormalities with the exception of perceived sensory deficit on the anterior lateral left thigh to light touch.  We will get some labs to rule out vitamin deficiency/thyroid disease that could potentially contribute to nerve pain, however MRI and EMG without abnormalities. The patient wants a new  referral to a different Neurology clinic, which was placed today. I discussed with him that it would be inappropriate for me to extend his FMLA, as there is no physical reason that he cannot return to work on Monday.  The patient was given a note that he was seen today.  It was brought to my attention that after I left the room, he wanted my medial assistant to change the date on his return to work form for next week.  He will return to the office in 6 weeks for follow up, at that time we will discuss that this behavior is inappropriate and if he continues he will be dismissed from this practice.  - DULoxetine (CYMBALTA) 20 MG capsule; Take 1 capsule (20 mg total) by mouth daily.  Dispense: 30 capsule; Refill: 1 - TSH - B12 and Folate Panel - Vitamin B1 - Vitamin D (25 hydroxy) - Ambulatory referral to Neurology   Return in about 6 weeks (around 11/01/2022).  Teodora Medici, DO

## 2022-09-20 NOTE — Patient Instructions (Signed)
It was great seeing you today!  Plan discussed at today's visit: -Blood work ordered today, results will be uploaded to [facility].  -Start Cymbalta 20 mg daily  -Referral to new Neurology placed today  Follow up in: 6 weeks   Take care and let us know if you have any questions or concerns prior to your next visit.  Dr. Rosana Berger

## 2022-09-23 NOTE — Addendum Note (Signed)
Addended by: Teodora Medici on: 09/23/2022 08:08 AM   Modules accepted: Orders

## 2022-09-25 LAB — VITAMIN B1: Vitamin B1 (Thiamine): 10 nmol/L (ref 8–30)

## 2022-09-25 LAB — TEST AUTHORIZATION

## 2022-09-25 LAB — T4, FREE: Free T4: 1.2 ng/dL (ref 0.8–1.8)

## 2022-09-25 LAB — VITAMIN D 25 HYDROXY (VIT D DEFICIENCY, FRACTURES): Vit D, 25-Hydroxy: 29 ng/mL — ABNORMAL LOW (ref 30–100)

## 2022-09-25 LAB — B12 AND FOLATE PANEL
Folate: >24 ng/mL
Vitamin B-12: 819 pg/mL (ref 200–1100)

## 2022-09-25 LAB — TSH: TSH: 0.18 mIU/L — ABNORMAL LOW (ref 0.40–4.50)

## 2022-09-26 ENCOUNTER — Telehealth: Payer: Self-pay | Admitting: Internal Medicine

## 2022-09-26 NOTE — Telephone Encounter (Signed)
Patient inquiring about lab results

## 2022-09-27 MED ORDER — VITAMIN D (ERGOCALCIFEROL) 1.25 MG (50000 UNIT) PO CAPS: 50000.0000 [IU] | ORAL_CAPSULE | ORAL | 0 refills | Status: DC

## 2022-09-27 NOTE — Addendum Note (Signed)
Addended by: Teodora Medici on: 09/27/2022 09:04 AM   Modules accepted: Orders

## 2022-09-27 NOTE — Telephone Encounter (Signed)
Will call patient.

## 2022-11-01 ENCOUNTER — Ambulatory Visit (INDEPENDENT_AMBULATORY_CARE_PROVIDER_SITE_OTHER): Payer: 59 | Admitting: Internal Medicine

## 2022-11-01 ENCOUNTER — Encounter: Payer: Self-pay | Admitting: Internal Medicine

## 2022-11-01 VITALS — BP 122/72 | HR 102 | Temp 97.6°F | Resp 16 | Ht 72.0 in | Wt 191.3 lb

## 2022-11-01 DIAGNOSIS — R200 Anesthesia of skin: Secondary | ICD-10-CM | POA: Diagnosis not present

## 2022-11-01 DIAGNOSIS — E559 Vitamin D deficiency, unspecified: Secondary | ICD-10-CM

## 2022-11-01 MED ORDER — VITAMIN D (ERGOCALCIFEROL) 1.25 MG (50000 UNIT) PO CAPS: 50000.0000 [IU] | ORAL_CAPSULE | ORAL | 0 refills | Status: DC

## 2022-11-01 NOTE — Patient Instructions (Signed)
It was great seeing you today!  Plan discussed at today's visit: -Take Vitamin D 50,000 IU once a week for 12 weeks, then switch to over the counter at least 1000 IU daily.  -Try Cymbalta 20 mg daily  Follow up in: as needed   Take care and let us know if you have any questions or concerns prior to your next visit.  Dr. Caralee Ates

## 2022-11-01 NOTE — Progress Notes (Signed)
Established Patient Office Visit  Subjective   Patient ID: Joseph Strong, male    DOB: 03/22/1971  Age: 51 y.o. MRN: [id number]  Chief Complaint  Patient presents with   Follow-up    HPI  Patient is here to follow up on medication and discuss numbness in his left leg. Underwent robotic assisted bilateral inguinal hernia repair and open umbilical hernia repair with mesh on 07/02/2022.  Patient states ever since this time he has been having numbness and a 1 x 2 inch diameter region on his left anterior lateral thigh.  He states the pain is a nerve type pain as well as a stabbing pain accompanied with muscle spasms.  He is also having pain in his left groin as well.  He has been seen for this issue at this office multiple times as well as several specialist.  He was seen by his general surgeon on 08/30/2022.  At that time he also reported pain in the periumbilical region as well as left-sided numbness.  He was told by general surgery that his discomfort is likely related to scar tissue and from a surgical standpoint he does not have any activity restrictions.  He was also seen by orthopedics at [facility] on 09/13/2022.  MRI of the lumbar spine was negative and nothing that would explain symptoms for hinder his ability to return to work.  EMG was negative. He was referred rehab medicine.  He was also seen by neurology, note reviewed from 09/04/2022 for the same symptoms.  He was given gabapentin, which the patient cannot take because it causes dizziness.  Lyrica was also tried but it was too expensive.  He was also treated with nortriptyline, which he did not complete.  He was seen in the ER on 09/17/2022 for the same symptoms.  He was given Mobic, which the patient states he cannot take due to side effects.  He was also given prednisone, which the patient reports today he only took 1 dose of it and it caused him to be "jittery".   NUMBNESS Duration:  Since his surgery in August Onset: sudden Location:  left anterior/lateral thigh Bilateral: no Symmetric: no Decreased sensation: yes  Weakness: yes Pain: yes Quality:  sharp, burning, and stabbing Severity: severe  Frequency: constant Trauma: yes - surgery Recent illness: no Diabetes: no HIV: no  Alcoholism: no  Alleviating factors: Nothing Aggravating factors: Nothing  Status: worse Treatments attempted: Mobic, naproxen, prednisone, gabapentin, Lyrica, nortriptyline. He was started on Cymbalta 20 mg at his last office visit which he did not start. He had labs that were without abnormalities with exception of low Vitamin D, he was started on supplements but did not start them.    Patient Active Problem List   Diagnosis Date Noted   Lumbar radiculopathy 09/05/2022   Recurrent umbilical hernia with incarceration    Bilateral recurrent inguinal hernia without obstruction or gangrene    Chronic pain syndrome 03/04/2021   Encounter for screening colonoscopy 03/04/2021   Disorder of skeletal system 03/04/2021   Problems influencing health status 03/04/2021   Pancreas divisum 10/09/2018   Constipation 10/09/2018   Ventral hernia without obstruction or gangrene 10/09/2018   Prostate cancer screening 09/16/2017   Abdominal pain, periumbilical 47/07/6282   Decreased frequency of bowel movements 01/10/2016   Paraspinal back pain (Left) 12/08/2015   Dizziness, nonspecific 10/31/2015   Abnormal TSH 10/31/2015   Chronic groin pain (Left) 10/31/2015   Abnormal EKG 10/31/2015   Testicular pain (Left) 09/20/2015   S/P  unilateral inguinal hernia repair 09/20/2015   Past Medical History:  Diagnosis Date   BPH (benign prostatic hyperplasia)    Burning with urination 01/10/2016   Constipation    Dysrhythmia    "OCCASSIONAL SPEEDING UP OF HEART RATE" PT THINKING IT MAY BE DUE TO HERNIA   Enlarged prostate    Headache    MIGRAINES   Inguinal hernia    left    Scoliosis    Past Surgical History:  Procedure Laterality Date    COLONOSCOPY WITH PROPOFOL N/A 05/10/2022   Procedure: COLONOSCOPY WITH PROPOFOL;  Surgeon: Lin Landsman, MD;  Location: [facility];  Service: Gastroenterology;  Laterality: N/A;   HERNIA REPAIR Left [id number]   AND UMBILICAL   INGUINAL HERNIA REPAIR Right 01/11/2016   Procedure: HERNIA REPAIR INGUINAL ADULT;  Surgeon: Christene Lye, MD;  Location: [facility];  Service: General;  Laterality: Right;   INSERTION OF MESH  07/02/2022   Procedure: INSERTION OF MESH- UMBILICAL;  Surgeon: Olean Ree, MD;  Location: [facility];  Service: General;;   TONSILLECTOMY     childhood   UMBILICAL HERNIA REPAIR     infant   UMBILICAL HERNIA REPAIR N/A 07/02/2022   Procedure: HERNIA REPAIR UMBILICAL ADULT open, recurrent; incarcerated;  Surgeon: Olean Ree, MD;  Location: [facility];  Service: General;  Laterality: N/A;   XI ROBOTIC ASSISTED INGUINAL HERNIA REPAIR WITH MESH Bilateral 07/02/2022   Procedure: XI ROBOTIC ASSISTED INGUINAL HERNIA WITH MESH, recurrent;  Surgeon: Olean Ree, MD;  Location: [facility];  Service: General;  Laterality: Bilateral;   Social History   Tobacco Use   Smoking status: Some Days    Types: Cigars    Passive exposure: Past   Smokeless tobacco: Never  Vaping Use   Vaping Use: Never used  Substance Use Topics   Alcohol use: Yes    Alcohol/week: 0.0 standard drinks of alcohol    Comment: occasional   Drug use: No   Social History   Socioeconomic History   Marital status: Single    Spouse name: Not on file   Number of children: 1   Years of education: 11   Highest education level: 11th grade  Occupational History   Not on file  Tobacco Use   Smoking status: Some Days    Types: Cigars    Passive exposure: Past   Smokeless tobacco: Never  Vaping Use   Vaping Use: Never used  Substance and Sexual Activity   Alcohol use: Yes    Alcohol/week: 0.0 standard drinks of alcohol    Comment: occasional   Drug use: No   Sexual activity: Yes  Other Topics  Concern   Not on file  Social History Narrative   Not on file   Social Determinants of Health   Financial Resource Strain: Low Risk  (02/21/2022)   Overall Financial Resource Strain (CARDIA)    Difficulty of Paying Living Expenses: Not hard at all  Food Insecurity: No Food Insecurity (02/21/2022)   Hunger Vital Sign    Worried About Running Out of Food in the Last Year: Never true    Ran Out of Food in the Last Year: Never true  Transportation Needs: No Transportation Needs (02/21/2022)   PRAPARE - Hydrologist (Medical): No    Lack of Transportation (Non-Medical): No  Physical Activity: Insufficiently Active (02/21/2022)   Exercise Vital Sign    Days of Exercise per Week: 3 days    Minutes of Exercise per  Session: 40 min  Stress: No Stress Concern Present (02/21/2022)   [place]    Feeling of Stress : Not at all  Social Connections: Moderately Integrated (02/21/2022)   Social Connection and Isolation Panel [NHANES]    Frequency of Communication with Friends and Family: More than three times a week    Frequency of Social Gatherings with Friends and Family: More than three times a week    Attends Religious Services: More than 4 times per year    Active Member of [employer] or Organizations: Yes    Attends Music therapist: More than 4 times per year    Marital Status: Never married  Intimate Partner Violence: Not At Risk (02/21/2022)   Humiliation, Afraid, Rape, and Kick questionnaire    Fear of Current or Ex-Partner: No    Emotionally Abused: No    Physically Abused: No    Sexually Abused: No   Family Status  Relation Name Status   Mother  Deceased   Father  Deceased   MGM  Alive   Neg Hx  (Not Specified)   Family History  Problem Relation Age of Onset   Hypertension Maternal Grandmother    Prostate cancer Neg Hx    Kidney cancer Neg Hx    Bladder Cancer Neg Hx    No  Known Allergies    Review of Systems  Musculoskeletal:  Positive for back pain.  Neurological:  Positive for sensory change and weakness.      Objective:     BP 122/72   Pulse (!) 102   Temp 97.6 F (36.4 C)   Resp 16   Ht 6' (1.829 m)   Wt 191 lb 4.8 oz (86.8 kg)   SpO2 98%   BMI 25.94 kg/m  BP Readings from Last 3 Encounters:  11/01/22 122/72  09/20/22 112/72  09/17/22 (!) 140/101   Wt Readings from Last 3 Encounters:  11/01/22 191 lb 4.8 oz (86.8 kg)  09/20/22 184 lb 6.4 oz (83.6 kg)  09/16/22 184 lb 14.4 oz (83.9 kg)      Physical Exam Constitutional:      Appearance: Normal appearance.  HENT:     Head: Normocephalic and atraumatic.  Eyes:     Conjunctiva/sclera: Conjunctivae normal.  Skin:    General: Skin is warm and dry.  Neurological:     General: No focal deficit present.     Mental Status: He is alert. Mental status is at baseline.     Gait: Gait normal.  Psychiatric:        Mood and Affect: Mood normal.        Behavior: Behavior normal.      No results found for any visits on 11/01/22.  Last CBC Lab Results  Component Value Date   WBC 5.9 02/21/2022   HGB 15.7 02/21/2022   HCT 47.9 02/21/2022   MCV 86.6 02/21/2022   MCH 28.4 02/21/2022   RDW 12.3 02/21/2022   PLT 244 31/59/4585   Last metabolic panel Lab Results  Component Value Date   GLUCOSE 89 02/21/2022   NA 137 02/21/2022   K 4.1 02/21/2022   CL 105 02/21/2022   CO2 23 02/21/2022   BUN 17 02/21/2022   CREATININE 1.22 02/21/2022   EGFR 72 02/21/2022   CALCIUM 8.9 02/21/2022   PROT 7.0 02/21/2022   ALBUMIN 3.6 02/25/2021   LABGLOB 2.8 07/10/2017   AGRATIO 1.5 07/10/2017   BILITOT 0.3 02/21/2022  ALKPHOS 34 (L) 02/25/2021   AST 19 02/21/2022   ALT 13 02/21/2022   ANIONGAP 8 05/19/2021   Last lipids Lab Results  Component Value Date   CHOL 161 02/21/2022   HDL 50 02/21/2022   LDLCALC 94 02/21/2022   TRIG 78 02/21/2022   CHOLHDL 3.2 02/21/2022   Last  hemoglobin A1c Lab Results  Component Value Date   HGBA1C 5.5 02/21/2022   Last thyroid functions Lab Results  Component Value Date   TSH 0.18 (L) 09/20/2022   T4TOTAL 7.3 04/26/2022   Last vitamin D Lab Results  Component Value Date   VD25OH 29 (L) 09/20/2022   Last vitamin B12 and Folate Lab Results  Component Value Date   VITAMINB12 819 09/20/2022   FOLATE >24.0 09/20/2022      The 10-year ASCVD risk score (Arnett DK, et al., 2019) is: 8.2%    Assessment & Plan:   1. Numbness of left anterior thigh: Patient has been trialed on multiple medications in the past for issue including gabapentin, Lyrica, anti-inflammatories and steroids which caused various side effects.  He was started on Cymbalta 20 mg at his last office visit but did not start the medication.  He has seen both orthopedics, neurology and general surgery.  At his last office visit patient was wanting referral to new neurologist, which was denied.  At this point our options are to try the Cymbalta which was sent sent to his pharmacy previously.  Patient will follow-up if/when he decides to take the medication.  2. Vitamin D deficiency: Start 50,000 IUs weekly for 12 weeks then switch to over-the-counter supplements at 1000 IUs daily.  - Vitamin D, Ergocalciferol, (DRISDOL) 1.25 MG (50000 UNIT) CAPS capsule; Take 1 capsule (50,000 Units total) by mouth every 7 (seven) days.  Dispense: 12 capsule; Refill: 0   Return if symptoms worsen or fail to improve.    Teodora Medici, DO

## 2022-12-25 ENCOUNTER — Other Ambulatory Visit: Payer: Self-pay | Admitting: Urology

## 2022-12-31 ENCOUNTER — Other Ambulatory Visit: Payer: Self-pay | Admitting: *Deleted

## 2022-12-31 DIAGNOSIS — Z125 Encounter for screening for malignant neoplasm of prostate: Secondary | ICD-10-CM

## 2023-01-02 ENCOUNTER — Other Ambulatory Visit: Payer: 59

## 2023-01-02 DIAGNOSIS — Z125 Encounter for screening for malignant neoplasm of prostate: Secondary | ICD-10-CM

## 2023-01-03 ENCOUNTER — Ambulatory Visit: Payer: 59 | Admitting: Urology

## 2023-01-05 LAB — PSA: Prostate Specific Ag, Serum: 0.9 ng/mL (ref 0.0–4.0)

## 2023-01-06 NOTE — Progress Notes (Unsigned)
12/16/2018  7:59 PM   [name] Feb 17, 1971 [id number]  Referring provider: Delsa Grana, PA-C [address] [place],  [place] [address]  Urological history: 1. BPH with LU TS -PSA (12/2022) 0.9 - cysto (2018) NED - I PSS *** - tadalafil 5 mg daily  2. Chronic pelvic pain/dysuria - scrotal US x 2 - NED - CT scans x 2 - NED - MRI NED - colonoscopy (04/2022) - NED  3. ED -Contributing factors of age, BPH, smoking -SHIM 25 - tadalafil 5 mg daily  4. Prostate cancer screening -PSA (12/2022) 0.9 -baseline PSA (2017) 0.7 at age 55 (slightly > median for age group) -African ancestry -No family history of prostate cancer, breast cancer or ovarian cancer   HPI: Pamela Janusz is a 52 y.o. male who presents for follow up.          Score:  1-7 Mild 8-19 Moderate 20-35 Severe       Score: 1-7 Severe ED 8-11 Moderate ED 12-16 Mild-Moderate ED 17-21 Mild ED 22-25 No ED   PMH: Past Medical History:  Diagnosis Date   BPH (benign prostatic hyperplasia)    Burning with urination 01/10/2016   Constipation    Dysrhythmia    "OCCASSIONAL SPEEDING UP OF HEART RATE" PT THINKING IT MAY BE DUE TO HERNIA   Enlarged prostate    Headache    MIGRAINES   Inguinal hernia    left    Scoliosis     Surgical History: Past Surgical History:  Procedure Laterality Date   COLONOSCOPY WITH PROPOFOL N/A 05/10/2022   Procedure: COLONOSCOPY WITH PROPOFOL;  Surgeon: Lin Landsman, MD;  Location: [facility];  Service: Gastroenterology;  Laterality: N/A;   HERNIA REPAIR Left [id number]   AND UMBILICAL   INGUINAL HERNIA REPAIR Right 01/11/2016   Procedure: HERNIA REPAIR INGUINAL ADULT;  Surgeon: Christene Lye, MD;  Location: [facility];  Service: General;  Laterality: Right;   INSERTION OF MESH  07/02/2022   Procedure: INSERTION OF MESH- UMBILICAL;  Surgeon: Olean Ree, MD;  Location: [facility];  Service: General;;   TONSILLECTOMY     childhood    UMBILICAL HERNIA REPAIR     infant   UMBILICAL HERNIA REPAIR N/A 07/02/2022   Procedure: HERNIA REPAIR UMBILICAL ADULT open, recurrent; incarcerated;  Surgeon: Olean Ree, MD;  Location: [facility];  Service: General;  Laterality: N/A;   XI ROBOTIC ASSISTED INGUINAL HERNIA REPAIR WITH MESH Bilateral 07/02/2022   Procedure: XI ROBOTIC ASSISTED INGUINAL HERNIA WITH MESH, recurrent;  Surgeon: Olean Ree, MD;  Location: [facility];  Service: General;  Laterality: Bilateral;    Home Medications:  Allergies as of 01/07/2023   No Known Allergies      Medication List        Accurate as of January 06, 2023  7:59 PM. If you have any questions, ask your nurse or doctor.          DULoxetine 20 MG capsule Commonly known as: Cymbalta Take 1 capsule (20 mg total) by mouth daily.   multivitamin tablet Take 1 tablet by mouth daily.   tadalafil 5 MG tablet Commonly known as: CIALIS TAKE ONE TABLET BY MOUTH DAILY AS NEEDED FOR ERECTILE DYSFUNCTION   Vitamin D (Ergocalciferol) 1.25 MG (50000 UNIT) Caps capsule Commonly known as: DRISDOL Take 1 capsule (50,000 Units total) by mouth every 7 (seven) days.        Allergies: No Known Allergies  Family History: Family History  Problem Relation Age  of Onset   Hypertension Maternal Grandmother    Prostate cancer Neg Hx    Kidney cancer Neg Hx    Bladder Cancer Neg Hx     Social History:  reports that he has been smoking cigars. He has been exposed to tobacco smoke. He has never used smokeless tobacco. He reports current alcohol use. He reports that he does not use drugs.  ROS: For pertinent review of systems please refer to history of present illness  Physical Exam: There were no vitals taken for this visit.  Constitutional:  Well nourished. Alert and oriented, No acute distress. HEENT: [place] AT, moist mucus membranes.  Trachea midline Cardiovascular: No clubbing, cyanosis, or edema. Respiratory: Normal respiratory effort, no increased  work of breathing. GU: No CVA tenderness.  No bladder fullness or masses.  Patient with circumcised/uncircumcised phallus. ***Foreskin easily retracted***  Urethral meatus is patent.  No penile discharge. No penile lesions or rashes. Scrotum without lesions, cysts, rashes and/or edema.  Testicles are located scrotally bilaterally. No masses are appreciated in the testicles. Left and right epididymis are normal. Rectal: Patient with  normal sphincter tone. Anus and perineum without scarring or rashes. No rectal masses are appreciated. Prostate is approximately *** grams, *** nodules are appreciated. Seminal vesicles are normal. Neurologic: Grossly intact, no focal deficits, moving all 4 extremities. Psychiatric: Normal mood and affect.   Laboratory Data: Component     Latest Ref Rng 01/02/2023  Prostate Specific Ag, Serum     0.0 - 4.0 ng/mL 0.9   I have reviewed the labs.   Pertinent Imaging: N/A    Assessment & Plan:    1. BPH with LUTS -PSA stable -PVR < 300 cc -continue conservative management, avoiding bladder irritants and timed voiding's -Continue Cialis 5 mg daily  2. ED -continue tadalafil 5 mg daily   No follow-ups on file.  These notes generated with voice recognition software. I apologize for typographical errors.  [place], [place] [address] [place], [place] [address] [phone]

## 2023-01-07 ENCOUNTER — Encounter: Payer: Self-pay | Admitting: Urology

## 2023-01-07 ENCOUNTER — Ambulatory Visit (INDEPENDENT_AMBULATORY_CARE_PROVIDER_SITE_OTHER): Payer: 59 | Admitting: Urology

## 2023-01-07 VITALS — BP 147/92 | HR 84 | Ht 72.0 in | Wt 185.0 lb

## 2023-01-07 DIAGNOSIS — N401 Enlarged prostate with lower urinary tract symptoms: Secondary | ICD-10-CM | POA: Diagnosis not present

## 2023-01-07 DIAGNOSIS — N529 Male erectile dysfunction, unspecified: Secondary | ICD-10-CM | POA: Diagnosis not present

## 2023-01-07 DIAGNOSIS — N138 Other obstructive and reflux uropathy: Secondary | ICD-10-CM

## 2023-02-25 ENCOUNTER — Encounter: Payer: 59 | Admitting: Family Medicine

## 2023-02-27 ENCOUNTER — Encounter: Payer: 59 | Admitting: Internal Medicine

## 2023-03-03 NOTE — Progress Notes (Unsigned)
Name: Joseph Strong   MRN: [id number]    DOB: 1971-01-18   Date:03/04/2023       Progress Note  Subjective  Chief Complaint  Chief Complaint  Patient presents with   Annual Exam    HPI  Patient presents for annual CPE. Patient wanting to discuss dysuria and bunion today as well.   Diet: well rounded, working on increasing fluid intake  Exercise: not regularly but is active at work Last Dental Exam: needs a Education officer, community - will call insurance to find a covered provider  Depression: phq 9 is negative    03/04/2023    3:34 PM 09/20/2022    2:09 PM 09/16/2022    9:17 AM 08/08/2022    2:45 PM 07/19/2022    3:06 PM  Depression screen PHQ 2/9  Decreased Interest 0 2 0 0 0  Down, Depressed, Hopeless 0 2 0 0 0  PHQ - 2 Score 0 4 0 0 0  Altered sleeping 0  0 0 0  Tired, decreased energy 0  0 0 0  Change in appetite 0  0 0 0  Feeling bad or failure about yourself  0  0 0 0  Trouble concentrating 0  0 0 0  Moving slowly or fidgety/restless 0  0 0 0  Suicidal thoughts 0  0 0 0  PHQ-9 Score 0  0 0 0  Difficult doing work/chores Not difficult at all  Not difficult at all Not difficult at all Not difficult at all    Hypertension:  BP Readings from Last 3 Encounters:  01/07/23 (!) 147/92  11/01/22 122/72  09/20/22 112/72    Obesity: Wt Readings from Last 3 Encounters:  03/04/23 195 lb 8 oz (88.7 kg)  01/07/23 185 lb (83.9 kg)  11/01/22 191 lb 4.8 oz (86.8 kg)   BMI Readings from Last 3 Encounters:  03/04/23 26.51 kg/m  01/07/23 25.09 kg/m  11/01/22 25.94 kg/m     Lipids:  Lab Results  Component Value Date   CHOL 161 02/21/2022   CHOL 160 12/29/2019   CHOL 167 12/10/2018   Lab Results  Component Value Date   HDL 50 02/21/2022   HDL 57 12/29/2019   HDL 57 12/10/2018   Lab Results  Component Value Date   LDLCALC 94 02/21/2022   LDLCALC 90 12/29/2019   LDLCALC 95 12/10/2018   Lab Results  Component Value Date   TRIG 78 02/21/2022   TRIG 44 12/29/2019   TRIG 61  12/10/2018   Lab Results  Component Value Date   CHOLHDL 3.2 02/21/2022   CHOLHDL 2.8 12/29/2019   CHOLHDL 2.9 12/10/2018   No results found for: "LDLDIRECT" Glucose:  Glucose  Date Value Ref Range Status  09/30/2014 92 65 - 99 mg/dL Final  63/84/5364 83 65 - 99 mg/dL Final  68/01/2121 56 (L) 65 - 99 mg/dL Final   Glucose, Bld  Date Value Ref Range Status  02/21/2022 89 65 - 99 mg/dL Final    Comment:    .            Fasting reference interval .   05/19/2021 130 (H) 70 - 99 mg/dL Final    Comment:    Glucose reference range applies only to samples taken after fasting for at least 8 hours.  02/25/2021 127 (H) 70 - 99 mg/dL Final    Comment:    Glucose reference range applies only to samples taken after fasting for at least 8 hours.    Flowsheet  Row Office Visit from 02/21/2022 in [facility]  AUDIT-C Score 1        Single STD testing and prevention (HIV/chl/gon/syphilis):  no concerns Hep C Screening: 2023 Skin cancer: Discussed monitoring for atypical lesions Colorectal cancer: colonoscopy 6/23 repeat in 10 year Prostate cancer:  yes Lab Results  Component Value Date   PSA 0.64 02/21/2022   PSA 0.7 11/05/2016    Lung cancer:  Low Dose CT Chest recommended if Age 1-80 years, 30 pack-year currently smoking OR have quit w/in 15years. Patient  yes a candidate for screening   AAA: The USPSTF recommends one-time screening with ultrasonography in men ages 31 to 75 years who have ever smoked. Patient   not applicable, a candidate for screening  ECG:  02/25/21  Vaccines:  Tdap: 2018 Shingrix: Qualifies, discussed getting at pharmacy  Pneumonia: Prevnar 23 in 2017 Flu: 2022 COVID-19: 2021  Advanced Care Planning: A voluntary discussion about advance care planning including the explanation and discussion of advance directives.  Discussed health care proxy and Living will, and the patient was unable to identify a health care proxy.  Patient  does not have a living will and power of attorney of health care   Patient Active Problem List   Diagnosis Date Noted   Lumbar radiculopathy 09/05/2022   Recurrent umbilical hernia with incarceration    Bilateral recurrent inguinal hernia without obstruction or gangrene    Chronic pain syndrome 03/04/2021   Encounter for screening colonoscopy 03/04/2021   Disorder of skeletal system 03/04/2021   Problems influencing health status 03/04/2021   Pancreas divisum 10/09/2018   Constipation 10/09/2018   Ventral hernia without obstruction or gangrene 10/09/2018   Prostate cancer screening 09/16/2017   Abdominal pain, periumbilical 12/20/2016   Decreased frequency of bowel movements 01/10/2016   Paraspinal back pain (Left) 12/08/2015   Dizziness, nonspecific 10/31/2015   Abnormal TSH 10/31/2015   Chronic groin pain (Left) 10/31/2015   Abnormal EKG 10/31/2015   Testicular pain (Left) 09/20/2015   S/P unilateral inguinal hernia repair 09/20/2015    Past Surgical History:  Procedure Laterality Date   COLONOSCOPY WITH PROPOFOL N/A 05/10/2022   Procedure: COLONOSCOPY WITH PROPOFOL;  Surgeon: Toney Reil, MD;  Location: [facility] ENDOSCOPY;  Service: Gastroenterology;  Laterality: N/A;   HERNIA REPAIR Left 1998   AND UMBILICAL   INGUINAL HERNIA REPAIR Right 01/11/2016   Procedure: HERNIA REPAIR INGUINAL ADULT;  Surgeon: Kieth Brightly, MD;  Location: [facility] ORS;  Service: General;  Laterality: Right;   INSERTION OF MESH  07/02/2022   Procedure: INSERTION OF MESH- UMBILICAL;  Surgeon: Henrene Dodge, MD;  Location: [facility] ORS;  Service: General;;   TONSILLECTOMY     childhood   UMBILICAL HERNIA REPAIR     infant   UMBILICAL HERNIA REPAIR N/A 07/02/2022   Procedure: HERNIA REPAIR UMBILICAL ADULT open, recurrent; incarcerated;  Surgeon: Henrene Dodge, MD;  Location: [facility] ORS;  Service: General;  Laterality: N/A;   XI ROBOTIC ASSISTED INGUINAL HERNIA REPAIR WITH MESH Bilateral 07/02/2022    Procedure: XI ROBOTIC ASSISTED INGUINAL HERNIA WITH MESH, recurrent;  Surgeon: Henrene Dodge, MD;  Location: [facility] ORS;  Service: General;  Laterality: Bilateral;    Family History  Problem Relation Age of Onset   Hypertension Maternal Grandmother    Prostate cancer Neg Hx    Kidney cancer Neg Hx    Bladder Cancer Neg Hx     Social History   Socioeconomic History   Marital status: Single  Spouse name: Not on file   Number of children: 1   Years of education: 63   Highest education level: 11th grade  Occupational History   Not on file  Tobacco Use   Smoking status: Some Days    Types: Cigars    Passive exposure: Past   Smokeless tobacco: Never  Vaping Use   Vaping Use: Never used  Substance and Sexual Activity   Alcohol use: Yes    Alcohol/week: 0.0 standard drinks of alcohol    Comment: occasional   Drug use: No   Sexual activity: Yes  Other Topics Concern   Not on file  Social History Narrative   Not on file   Social Determinants of Health   Financial Resource Strain: Low Risk  (02/21/2022)   Overall Financial Resource Strain (CARDIA)    Difficulty of Paying Living Expenses: Not hard at all  Food Insecurity: No Food Insecurity (02/21/2022)   Hunger Vital Sign    Worried About Running Out of Food in the Last Year: Never true    Ran Out of Food in the Last Year: Never true  Transportation Needs: No Transportation Needs (02/21/2022)   PRAPARE - Administrator, Civil Service (Medical): No    Lack of Transportation (Non-Medical): No  Physical Activity: Insufficiently Active (02/21/2022)   Exercise Vital Sign    Days of Exercise per Week: 3 days    Minutes of Exercise per Session: 40 min  Stress: No Stress Concern Present (02/21/2022)   [employer] of Occupational Health - Occupational Stress Questionnaire    Feeling of Stress : Not at all  Social Connections: Moderately Integrated (02/21/2022)   Social Connection and Isolation Panel [NHANES]     Frequency of Communication with Friends and Family: More than three times a week    Frequency of Social Gatherings with Friends and Family: More than three times a week    Attends Religious Services: More than 4 times per year    Active Member of [employer] or Organizations: Yes    Attends Banker Meetings: More than 4 times per year    Marital Status: Never married  Intimate Partner Violence: Not At Risk (02/21/2022)   Humiliation, Afraid, Rape, and Kick questionnaire    Fear of Current or Ex-Partner: No    Emotionally Abused: No    Physically Abused: No    Sexually Abused: No     Current Outpatient Medications:    pregabalin (LYRICA) 25 MG capsule, Take 25 mg twice a day for 2 weeks, then increase to 50 mg twice a day, Disp: , Rfl:    tadalafil (CIALIS) 5 MG tablet, TAKE ONE TABLET BY MOUTH DAILY AS NEEDED FOR ERECTILE DYSFUNCTION, Disp: 90 tablet, Rfl: 3  No Known Allergies   Review of Systems  Genitourinary:  Positive for dysuria, frequency and urgency. Negative for flank pain and hematuria.      Objective  Vitals:   03/04/23 1530  Resp: 18  Temp: (!) 97.5 F (36.4 C)  Weight: 195 lb 8 oz (88.7 kg)  Height: 6' (1.829 m)    Body mass index is 26.51 kg/m.  Physical Exam Constitutional:      Appearance: Normal appearance.  HENT:     Head: Normocephalic and atraumatic.     Mouth/Throat:     Mouth: Mucous membranes are moist.     Pharynx: Oropharynx is clear.  Eyes:     Extraocular Movements: Extraocular movements intact.     Conjunctiva/sclera:  Conjunctivae normal.     Pupils: Pupils are equal, round, and reactive to light.  Cardiovascular:     Rate and Rhythm: Normal rate and regular rhythm.  Pulmonary:     Effort: Pulmonary effort is normal.     Breath sounds: Normal breath sounds.  Musculoskeletal:     Right lower leg: No edema.     Left lower leg: No edema.  Skin:    General: Skin is warm and dry.  Neurological:     General: No focal deficit  present.     Mental Status: He is alert. Mental status is at baseline.  Psychiatric:        Mood and Affect: Mood normal.        Behavior: Behavior normal.      Recent Results (from the past 2160 hour(s))  PSA     Status: None   Collection Time: 01/02/23  3:27 PM  Result Value Ref Range   Prostate Specific Ag, Serum 0.9 0.0 - 4.0 ng/mL    Comment: Roche ECLIA methodology. According to the American Urological Association, Serum PSA should decrease and remain at undetectable levels after radical prostatectomy. The AUA defines biochemical recurrence as an initial PSA value 0.2 ng/mL or greater followed by a subsequent confirmatory PSA value 0.2 ng/mL or greater. Values obtained with different assay methods or kits cannot be used interchangeably. Results cannot be interpreted as absolute evidence of the presence or absence of malignant disease.      Fall Risk:    03/04/2023    3:31 PM 11/01/2022    3:01 PM 09/20/2022    2:03 PM 09/16/2022    9:17 AM 08/08/2022    2:45 PM  Fall Risk   Falls in the past year? 0 0 0 0 0  Number falls in past yr: 0 0 0 0 0  Injury with Fall? 0 0 0 0 0  Risk for fall due to :    No Fall Risks No Fall Risks  Follow up    Falls prevention discussed;Education provided;Falls evaluation completed Falls prevention discussed;Education provided     Functional Status Survey: Is the patient deaf or have difficulty hearing?: No Does the patient have difficulty seeing, even when wearing glasses/contacts?: No Does the patient have difficulty concentrating, remembering, or making decisions?: No Does the patient have difficulty walking or climbing stairs?: Yes Does the patient have difficulty dressing or bathing?: Yes Does the patient have difficulty doing errands alone such as visiting a doctor's office or shopping?: No    Assessment & Plan  1. Annual physical exam: Labs due.  - CBC w/Diff/Platelet - COMPLETE METABOLIC PANEL WITH GFR - Lipid  Profile  2. Screening for lung cancer: Referral placed for lung cancer screening.  - Ambulatory Referral Lung Cancer Screening [facility]  3. Dysuria: UA today normal.   - POCT Urinalysis Dipstick  4. Bunion of right foot: Referral to podiatry placed.   - Ambulatory referral to Podiatry    -Prostate cancer screening and PSA options (with potential risks and benefits of testing vs not testing) were discussed along with recent recs/guidelines. -USPSTF grade A and B recommendations reviewed with patient; age-appropriate recommendations, preventive care, screening tests, etc discussed and encouraged; healthy living encouraged; see AVS for patient education given to patient -Discussed importance of 150 minutes of physical activity weekly, eat two servings of fish weekly, eat one serving of tree nuts ( cashews, pistachios, pecans, almonds.Marland Kitchen.) every other day, eat 6 servings of fruit/vegetables daily and  drink plenty of water and avoid sweet beverages.  -Reviewed Health Maintenance: yes

## 2023-03-04 ENCOUNTER — Encounter: Payer: Self-pay | Admitting: Internal Medicine

## 2023-03-04 ENCOUNTER — Ambulatory Visit (INDEPENDENT_AMBULATORY_CARE_PROVIDER_SITE_OTHER): Payer: 59 | Admitting: Internal Medicine

## 2023-03-04 VITALS — BP 118/82 | HR 80 | Temp 97.5°F | Resp 18 | Ht 72.0 in | Wt 195.5 lb

## 2023-03-04 DIAGNOSIS — M21611 Bunion of right foot: Secondary | ICD-10-CM | POA: Diagnosis not present

## 2023-03-04 DIAGNOSIS — R300 Dysuria: Secondary | ICD-10-CM

## 2023-03-04 DIAGNOSIS — Z0001 Encounter for general adult medical examination with abnormal findings: Secondary | ICD-10-CM

## 2023-03-04 DIAGNOSIS — Z0000 Encounter for general adult medical examination without abnormal findings: Secondary | ICD-10-CM | POA: Diagnosis not present

## 2023-03-04 DIAGNOSIS — Z122 Encounter for screening for malignant neoplasm of respiratory organs: Secondary | ICD-10-CM

## 2023-03-04 LAB — POCT URINALYSIS DIPSTICK
Bilirubin, UA: NEGATIVE
Blood, UA: NEGATIVE
Glucose, UA: NEGATIVE
Ketones, UA: NEGATIVE
Leukocytes, UA: NEGATIVE
Nitrite, UA: NEGATIVE
Odor: NORMAL
Protein, UA: NEGATIVE
Spec Grav, UA: 1.015 (ref 1.010–1.025)
Urobilinogen, UA: 0.2 E.U./dL
pH, UA: 6 (ref 5.0–8.0)

## 2023-03-04 NOTE — Patient Instructions (Signed)
It was great seeing you today!  Plan discussed at today's visit: -Blood work ordered today, results will be uploaded to MyChart.  -Referral for lung cancer screening placed -Referral for Podiatry placed as well   Follow up in: 1 year   Take care and let us know if you have any questions or concerns prior to your next visit.  Dr. Caralee Ates

## 2023-03-05 ENCOUNTER — Telehealth: Payer: Self-pay | Admitting: *Deleted

## 2023-03-05 LAB — COMPLETE METABOLIC PANEL WITH GFR
AG Ratio: 1.6 (calc) (ref 1.0–2.5)
ALT: 14 U/L (ref 9–46)
AST: 16 U/L (ref 10–35)
Albumin: 4.1 g/dL (ref 3.6–5.1)
Alkaline phosphatase (APISO): 53 U/L (ref 35–144)
BUN/Creatinine Ratio: 10 (calc) (ref 6–22)
BUN: 13 mg/dL (ref 7–25)
CO2: 25 mmol/L (ref 20–32)
Calcium: 9.1 mg/dL (ref 8.6–10.3)
Chloride: 108 mmol/L (ref 98–110)
Creat: 1.36 mg/dL — ABNORMAL HIGH (ref 0.70–1.30)
Globulin: 2.5 g/dL (calc) (ref 1.9–3.7)
Glucose, Bld: 72 mg/dL (ref 65–99)
Potassium: 4.6 mmol/L (ref 3.5–5.3)
Sodium: 140 mmol/L (ref 135–146)
Total Bilirubin: 0.3 mg/dL (ref 0.2–1.2)
Total Protein: 6.6 g/dL (ref 6.1–8.1)
eGFR: 63 mL/min/{1.73_m2} (ref >=60)

## 2023-03-05 LAB — CBC WITH DIFFERENTIAL/PLATELET
Absolute Monocytes: 729 cells/uL (ref 200–950)
Basophils Absolute: 38 cells/uL (ref 0–200)
Basophils Relative: 0.7 %
Eosinophils Absolute: 151 cells/uL (ref 15–500)
Eosinophils Relative: 2.8 %
HCT: 46.7 % (ref 38.5–50.0)
Hemoglobin: 15.3 g/dL (ref 13.2–17.1)
Lymphs Abs: 1663 cells/uL (ref 850–3900)
MCH: 28.5 pg (ref 27.0–33.0)
MCHC: 32.8 g/dL (ref 32.0–36.0)
MCV: 87 fL (ref 80.0–100.0)
MPV: 10.6 fL (ref 7.5–12.5)
Monocytes Relative: 13.5 %
Neutro Abs: 2819 cells/uL (ref 1500–7800)
Neutrophils Relative %: 52.2 %
Platelets: 307 10*3/uL (ref 140–400)
RBC: 5.37 10*6/uL (ref 4.20–5.80)
RDW: 12.6 % (ref 11.0–15.0)
Total Lymphocyte: 30.8 %
WBC: 5.4 10*3/uL (ref 3.8–10.8)

## 2023-03-05 LAB — LIPID PANEL
Cholesterol: 148 mg/dL (ref <200)
HDL: 47 mg/dL (ref >=40)
LDL Cholesterol (Calc): 81 mg/dL (calc)
Non-HDL Cholesterol (Calc): 101 mg/dL (calc) (ref <130)
Total CHOL/HDL Ratio: 3.1 (calc) (ref <5.0)
Triglycerides: 102 mg/dL (ref <150)

## 2023-03-05 NOTE — Telephone Encounter (Signed)
Patient called and notified: Kidney number up just slightly, this is most likely due to dehydration and he needs to work on increasing fluid intake. Otherwise labs look good - no abnormalities noted with electrolytes or liver. No anemia. Cholesterol normal. Urine was negative for signs of infection.

## 2023-03-18 ENCOUNTER — Encounter: Payer: 59 | Admitting: Family Medicine

## 2023-04-15 ENCOUNTER — Ambulatory Visit: Payer: 59 | Admitting: Podiatry

## 2023-04-24 ENCOUNTER — Ambulatory Visit: Payer: 59 | Admitting: Podiatry

## 2023-05-06 ENCOUNTER — Ambulatory Visit: Payer: 59 | Admitting: Podiatry

## 2023-06-03 ENCOUNTER — Ambulatory Visit: Payer: 59 | Admitting: Podiatry

## 2023-06-17 ENCOUNTER — Encounter: Payer: Self-pay | Admitting: *Deleted

## 2023-07-18 ENCOUNTER — Other Ambulatory Visit: Payer: Self-pay

## 2023-07-18 ENCOUNTER — Encounter: Payer: Self-pay | Admitting: Emergency Medicine

## 2023-07-18 DIAGNOSIS — G5712 Meralgia paresthetica, left lower limb: Secondary | ICD-10-CM | POA: Diagnosis not present

## 2023-07-18 DIAGNOSIS — E876 Hypokalemia: Secondary | ICD-10-CM | POA: Diagnosis not present

## 2023-07-18 DIAGNOSIS — M79652 Pain in left thigh: Secondary | ICD-10-CM | POA: Diagnosis present

## 2023-07-18 LAB — CBC WITH DIFFERENTIAL/PLATELET
Abs Immature Granulocytes: 0.04 10*3/uL (ref 0.00–0.07)
Basophils Absolute: 0 10*3/uL (ref 0.0–0.1)
Basophils Relative: 1 %
Eosinophils Absolute: 0.2 10*3/uL (ref 0.0–0.5)
Eosinophils Relative: 3 %
HCT: 48.7 % (ref 39.0–52.0)
Hemoglobin: 15.7 g/dL (ref 13.0–17.0)
Immature Granulocytes: 1 %
Lymphocytes Relative: 34 %
Lymphs Abs: 2.3 10*3/uL (ref 0.7–4.0)
MCH: 28.1 pg (ref 26.0–34.0)
MCHC: 32.2 g/dL (ref 30.0–36.0)
MCV: 87.1 fL (ref 80.0–100.0)
Monocytes Absolute: 0.8 10*3/uL (ref 0.1–1.0)
Monocytes Relative: 12 %
Neutro Abs: 3.4 10*3/uL (ref 1.7–7.7)
Neutrophils Relative %: 49 %
Platelets: 232 10*3/uL (ref 150–400)
RBC: 5.59 MIL/uL (ref 4.22–5.81)
RDW: 13.8 % (ref 11.5–15.5)
WBC: 6.7 10*3/uL (ref 4.0–10.5)
nRBC: 0 % (ref 0.0–0.2)

## 2023-07-18 LAB — URINALYSIS, ROUTINE W REFLEX MICROSCOPIC
Bilirubin Urine: NEGATIVE
Glucose, UA: NEGATIVE mg/dL
Hgb urine dipstick: NEGATIVE
Ketones, ur: NEGATIVE mg/dL
Leukocytes,Ua: NEGATIVE
Nitrite: NEGATIVE
Protein, ur: NEGATIVE mg/dL
Specific Gravity, Urine: 1.003 — ABNORMAL LOW (ref 1.005–1.030)
pH: 6 (ref 5.0–8.0)

## 2023-07-18 LAB — COMPREHENSIVE METABOLIC PANEL
ALT: 20 U/L (ref 0–44)
AST: 22 U/L (ref 15–41)
Albumin: 3.9 g/dL (ref 3.5–5.0)
Alkaline Phosphatase: 49 U/L (ref 38–126)
Anion gap: 9 (ref 5–15)
BUN: 15 mg/dL (ref 6–20)
CO2: 20 mmol/L — ABNORMAL LOW (ref 22–32)
Calcium: 8.8 mg/dL — ABNORMAL LOW (ref 8.9–10.3)
Chloride: 107 mmol/L (ref 98–111)
Creatinine, Ser: 1.17 mg/dL (ref 0.61–1.24)
GFR, Estimated: >60 mL/min (ref >60)
Glucose, Bld: 124 mg/dL — ABNORMAL HIGH (ref 70–99)
Potassium: 3.4 mmol/L — ABNORMAL LOW (ref 3.5–5.1)
Sodium: 136 mmol/L (ref 135–145)
Total Bilirubin: 0.8 mg/dL (ref 0.3–1.2)
Total Protein: 7.1 g/dL (ref 6.5–8.1)

## 2023-07-18 NOTE — ED Triage Notes (Signed)
Pt presents ambulatory to triage via POV with complaints of nerve pain to his L thigh and L side following a hernia repair over a year ago. Pt with multiple complaints of and sites of pain but localized to the L leg. A&Ox4 at this time. Denies CP or SOB.

## 2023-07-19 ENCOUNTER — Emergency Department
Admission: EM | Admit: 2023-07-19 | Discharge: 2023-07-19 | Disposition: A | Discharge status: Home / Self Care | Payer: 59 | Attending: Emergency Medicine | Admitting: Emergency Medicine

## 2023-07-19 DIAGNOSIS — G5712 Meralgia paresthetica, left lower limb: Secondary | ICD-10-CM

## 2023-07-19 DIAGNOSIS — E876 Hypokalemia: Secondary | ICD-10-CM

## 2023-07-19 MED ORDER — LIDOCAINE 5 % EX PTCH
1.0000 | MEDICATED_PATCH | CUTANEOUS | Status: DC
  Administered 2023-07-19: 1 via TRANSDERMAL
  Filled 2023-07-19: qty 1

## 2023-07-19 MED ORDER — LIDOCAINE 5 % EX PTCH: 1.0000 | MEDICATED_PATCH | CUTANEOUS | 0 refills | Status: DC

## 2023-07-19 MED ORDER — POTASSIUM CHLORIDE CRYS ER 20 MEQ PO TBCR
20.0000 meq | EXTENDED_RELEASE_TABLET | Freq: Once | ORAL | Status: AC
  Administered 2023-07-19: 20 meq via ORAL
  Filled 2023-07-19: qty 1

## 2023-07-19 NOTE — Discharge Instructions (Addendum)
Apply Lidocaine patch to left thigh as instructed.  Return to the ER for worsening symptoms, extremity weakness, or other concerns.

## 2023-07-19 NOTE — ED Provider Notes (Signed)
[facility] Provider Note    Event Date/Time   First MD Initiated Contact with Patient 07/19/23 0037     (approximate)   History   Peripheral Neuropathy   HPI  Joseph Strong is a 53 y.o. male who presents to the ED from home with a chief complaint of nerve pain to his left lateral thigh.  Patient with a history of left meralgia paresthetica status post hernia surgery over 1 year ago followed by Dr. Malvin Johns from neurology.  He had side effects to gabapentin, could not afford nortriptyline, and is currently on Lyrica but at half the dose prescribed due to drowsiness side effects.  Presents with acute on chronic pain, no different than usual pain just increased.  Denies fever/chills, chest pain, shortness of breath, abdominal pain, nausea, vomiting, extremity weakness.     Past Medical History   Past Medical History:  Diagnosis Date   BPH (benign prostatic hyperplasia)    Burning with urination 01/10/2016   Constipation    Dysrhythmia    "OCCASSIONAL SPEEDING UP OF HEART RATE" PT THINKING IT MAY BE DUE TO HERNIA   Enlarged prostate    Headache    MIGRAINES   Inguinal hernia    left    Scoliosis      Active Problem List   Patient Active Problem List   Diagnosis Date Noted   Lumbar radiculopathy 09/05/2022   Recurrent umbilical hernia with incarceration    Bilateral recurrent inguinal hernia without obstruction or gangrene    Chronic pain syndrome 03/04/2021   Encounter for screening colonoscopy 03/04/2021   Disorder of skeletal system 03/04/2021   Problems influencing health status 03/04/2021   Pancreas divisum 10/09/2018   Constipation 10/09/2018   Ventral hernia without obstruction or gangrene 10/09/2018   Prostate cancer screening 09/16/2017   Abdominal pain, periumbilical 12/20/2016   Decreased frequency of bowel movements 01/10/2016   Paraspinal back pain (Left) 12/08/2015   Dizziness, nonspecific 10/31/2015   Abnormal TSH 10/31/2015    Chronic groin pain (Left) 10/31/2015   Abnormal EKG 10/31/2015   Testicular pain (Left) 09/20/2015   S/P unilateral inguinal hernia repair 09/20/2015     Past Surgical History   Past Surgical History:  Procedure Laterality Date   COLONOSCOPY WITH PROPOFOL N/A 05/10/2022   Procedure: COLONOSCOPY WITH PROPOFOL;  Surgeon: Toney Reil, MD;  [facility] ENDOSCOPY;  Service: Gastroenterology;  Laterality: N/A;   HERNIA REPAIR Left 1998   AND UMBILICAL   INGUINAL HERNIA REPAIR Right 01/11/2016   Procedure: HERNIA REPAIR INGUINAL ADULT;  Surgeon: Kieth Brightly, MD;  Location: [facility] ORS;  Service: General;  Laterality: Right;   INSERTION OF MESH  07/02/2022   Procedure: INSERTION OF MESH- UMBILICAL;  Surgeon: Henrene Dodge, MD;  Location: [facility] ORS;  Service: General;;   TONSILLECTOMY     childhood   UMBILICAL HERNIA REPAIR     infant   UMBILICAL HERNIA REPAIR N/A 07/02/2022   Procedure: HERNIA REPAIR UMBILICAL ADULT open, recurrent; incarcerated;  Surgeon: Henrene Dodge, MD;  Location: [facility] ORS;  Service: General;  Laterality: N/A;   XI ROBOTIC ASSISTED INGUINAL HERNIA REPAIR WITH MESH Bilateral 07/02/2022   Procedure: XI ROBOTIC ASSISTED INGUINAL HERNIA WITH MESH, recurrent;  Surgeon: Henrene Dodge, MD;  Location: [facility] ORS;  Service: General;  Laterality: Bilateral;     Home Medications   Prior to Admission medications   Medication Sig Start Date End Date Taking? Authorizing Provider  lidocaine (LIDODERM) 5 % Place 1 patch  onto the skin daily. Remove & Discard patch within 12 hours or as directed by MD 07/19/23  Yes Irean Hong, MD  pregabalin (LYRICA) 25 MG capsule Take 25 mg twice a day for 2 weeks, then increase to 50 mg twice a day 12/24/22   [provider]  tadalafil (CIALIS) 5 MG tablet TAKE ONE TABLET BY MOUTH DAILY AS NEEDED FOR ERECTILE DYSFUNCTION 12/26/22   Michiel Cowboy A, PA-C     Allergies  Patient has no known allergies.   Family History    Family History  Problem Relation Age of Onset   Hypertension Maternal Grandmother    Prostate cancer Neg Hx    Kidney cancer Neg Hx    Bladder Cancer Neg Hx      Physical Exam  Triage Vital Signs: ED Triage Vitals  Encounter Vitals Group     BP 07/18/23 2159 (!) 125/90     Systolic BP Percentile --      Diastolic BP Percentile --      Pulse Rate 07/18/23 2159 86     Resp 07/18/23 2159 18     Temp 07/18/23 2159 97.8 F (36.6 C)     Temp Source 07/18/23 2159 Oral     SpO2 07/18/23 2159 100 %     Weight 07/18/23 2200 196 lb 10.4 oz (89.2 kg)     Height 07/18/23 2200 6' (1.829 m)     Head Circumference --      Peak Flow --      Pain Score 07/18/23 2200 5     Pain Loc --      Pain Education --      Exclude from Growth Chart --     Updated Vital Signs: BP (!) 125/90 (BP Location: Left Arm)   Pulse 86   Temp 97.8 F (36.6 C) (Oral)   Resp 18   Ht 6' (1.829 m)   Wt 89.2 kg   SpO2 100%   BMI 26.67 kg/m    General: Awake, no distress.  CV:  RRR.  Good peripheral perfusion.  Resp:  Normal effort.  CTAB. Abd:  No distention.  Other:  5/5 motor strength and sensation. BLE.  Hypersensitive left lateral thigh.  2+ femoral and distal pulses.   ED Results / Procedures / Treatments  Labs (all labs ordered are listed, but only abnormal results are displayed) Labs Reviewed  COMPREHENSIVE METABOLIC PANEL - Abnormal; Notable for the following components:      Result Value   Potassium 3.4 (*)    CO2 20 (*)    Glucose, Bld 124 (*)    Calcium 8.8 (*)    All other components within normal limits  URINALYSIS, ROUTINE W REFLEX MICROSCOPIC - Abnormal; Notable for the following components:   Color, Urine COLORLESS (*)    APPearance CLEAR (*)    Specific Gravity, Urine 1.003 (*)    All other components within normal limits  CBC WITH DIFFERENTIAL/PLATELET     EKG  None   RADIOLOGY None   Official radiology report(s): No results found.   PROCEDURES:  Critical  Care performed: No  Procedures   MEDICATIONS ORDERED IN ED: Medications  lidocaine (LIDODERM) 5 % 1 patch (1 patch Transdermal Patch Applied 07/19/23 0101)  potassium chloride SA (KLOR-CON M) CR tablet 20 mEq (20 mEq Oral Given 07/19/23 0101)     IMPRESSION / MDM / ASSESSMENT AND PLAN / ED COURSE  I reviewed the triage vital signs and the nursing notes.  52 year old male presenting with acute on chronic nerve pain to left lateral thigh.  Laboratory results unremarkable other than minimally decreased potassium.  Will replete orally.  After discussion with patient, we agreed to trial lidocaine patch to affected area.  This will offer the benefit of not causing drowsiness like his other medication.  He will follow-up with neurology as an outpatient.  Strict return precautions given.  Patient verbalizes understanding agrees with plan of care.  Patient's presentation is most consistent with acute, uncomplicated illness.   FINAL CLINICAL IMPRESSION(S) / ED DIAGNOSES   Final diagnoses:  Meralgia paraesthetica, left  Hypokalemia     Rx / DC Orders   ED Discharge Orders          Ordered    lidocaine (LIDODERM) 5 %  Every 24 hours        07/19/23 0057             Note:  This document was prepared using Dragon voice recognition software and may include unintentional dictation errors.   Irean Hong, MD 07/19/23 [phone]

## 2023-08-13 ENCOUNTER — Other Ambulatory Visit: Payer: Self-pay

## 2023-08-13 DIAGNOSIS — R1030 Lower abdominal pain, unspecified: Secondary | ICD-10-CM | POA: Diagnosis not present

## 2023-08-13 DIAGNOSIS — R079 Chest pain, unspecified: Secondary | ICD-10-CM | POA: Insufficient documentation

## 2023-08-13 DIAGNOSIS — Z5321 Procedure and treatment not carried out due to patient leaving prior to being seen by health care provider: Secondary | ICD-10-CM | POA: Insufficient documentation

## 2023-08-13 DIAGNOSIS — M79652 Pain in left thigh: Secondary | ICD-10-CM | POA: Diagnosis present

## 2023-08-13 LAB — CBC WITH DIFFERENTIAL/PLATELET
Abs Immature Granulocytes: 0.03 10*3/uL (ref 0.00–0.07)
Basophils Absolute: 0 10*3/uL (ref 0.0–0.1)
Basophils Relative: 1 %
Eosinophils Absolute: 0.1 10*3/uL (ref 0.0–0.5)
Eosinophils Relative: 3 %
HCT: 46.2 % (ref 39.0–52.0)
Hemoglobin: 15.5 g/dL (ref 13.0–17.0)
Immature Granulocytes: 1 %
Lymphocytes Relative: 39 %
Lymphs Abs: 2.1 10*3/uL (ref 0.7–4.0)
MCH: 28.5 pg (ref 26.0–34.0)
MCHC: 33.5 g/dL (ref 30.0–36.0)
MCV: 85.1 fL (ref 80.0–100.0)
Monocytes Absolute: 0.6 10*3/uL (ref 0.1–1.0)
Monocytes Relative: 11 %
Neutro Abs: 2.5 10*3/uL (ref 1.7–7.7)
Neutrophils Relative %: 45 %
Platelets: 219 10*3/uL (ref 150–400)
RBC: 5.43 MIL/uL (ref 4.22–5.81)
RDW: 14 % (ref 11.5–15.5)
WBC: 5.3 10*3/uL (ref 4.0–10.5)
nRBC: 0 % (ref 0.0–0.2)

## 2023-08-13 LAB — COMPREHENSIVE METABOLIC PANEL WITH GFR
ALT: 20 U/L (ref 0–44)
AST: 19 U/L (ref 15–41)
Albumin: 4 g/dL (ref 3.5–5.0)
Alkaline Phosphatase: 52 U/L (ref 38–126)
Anion gap: 5 (ref 5–15)
BUN: 13 mg/dL (ref 6–20)
CO2: 25 mmol/L (ref 22–32)
Calcium: 8.8 mg/dL — ABNORMAL LOW (ref 8.9–10.3)
Chloride: 108 mmol/L (ref 98–111)
Creatinine, Ser: 1.03 mg/dL (ref 0.61–1.24)
GFR, Estimated: >60 mL/min (ref >60)
Glucose, Bld: 105 mg/dL — ABNORMAL HIGH (ref 70–99)
Potassium: 3.6 mmol/L (ref 3.5–5.1)
Sodium: 138 mmol/L (ref 135–145)
Total Bilirubin: 0.7 mg/dL (ref 0.3–1.2)
Total Protein: 7.2 g/dL (ref 6.5–8.1)

## 2023-08-13 LAB — TROPONIN I (HIGH SENSITIVITY): Troponin I (High Sensitivity): 4 ng/L (ref <18)

## 2023-08-13 NOTE — ED Triage Notes (Signed)
Pt reports nerve pain r/t 5 prior hernia operations. Reports pain to inguinal and L thigh. Pt also reports new onset of central chest pain that resolved 1 hr after taking gabapentin. Reports hx of a nerve condition. Pt ambulatory to triage. Alert and oriented. Breathing unlabored speaking in full sentences. Pt poor historian.

## 2023-08-14 ENCOUNTER — Emergency Department
Admission: EM | Admit: 2023-08-14 | Discharge: 2023-08-14 | Discharge status: Against Medical Advice | Payer: 59 | Attending: Emergency Medicine | Admitting: Emergency Medicine

## 2023-08-14 ENCOUNTER — Telehealth: Payer: Self-pay | Admitting: Internal Medicine

## 2023-08-14 LAB — TROPONIN I (HIGH SENSITIVITY): Troponin I (High Sensitivity): 3 ng/L (ref <18)

## 2023-08-14 NOTE — Telephone Encounter (Signed)
Patient called to get lab results that he had completed from the ED. Results given and patient verbal understanding.

## 2023-08-16 ENCOUNTER — Other Ambulatory Visit: Payer: Self-pay

## 2023-08-16 ENCOUNTER — Emergency Department
Admission: EM | Admit: 2023-08-16 | Discharge: 2023-08-16 | Disposition: A | Discharge status: Home / Self Care | Payer: 59 | Attending: Emergency Medicine | Admitting: Emergency Medicine

## 2023-08-16 DIAGNOSIS — G629 Polyneuropathy, unspecified: Secondary | ICD-10-CM | POA: Diagnosis not present

## 2023-08-16 DIAGNOSIS — G5712 Meralgia paresthetica, left lower limb: Secondary | ICD-10-CM | POA: Insufficient documentation

## 2023-08-16 DIAGNOSIS — G6289 Other specified polyneuropathies: Secondary | ICD-10-CM

## 2023-08-16 DIAGNOSIS — B353 Tinea pedis: Secondary | ICD-10-CM | POA: Diagnosis not present

## 2023-08-16 LAB — URINALYSIS, ROUTINE W REFLEX MICROSCOPIC
Bilirubin Urine: NEGATIVE
Glucose, UA: NEGATIVE mg/dL
Hgb urine dipstick: NEGATIVE
Ketones, ur: NEGATIVE mg/dL
Leukocytes,Ua: NEGATIVE
Nitrite: NEGATIVE
Protein, ur: NEGATIVE mg/dL
Specific Gravity, Urine: 1.012 (ref 1.005–1.030)
pH: 5 (ref 5.0–8.0)

## 2023-08-16 MED ORDER — LIDOCAINE 5 % EX PTCH: 1.0000 | MEDICATED_PATCH | CUTANEOUS | 0 refills | Status: DC

## 2023-08-16 MED ORDER — LIDOCAINE 5 % EX PTCH
1.0000 | MEDICATED_PATCH | CUTANEOUS | Status: DC
  Administered 2023-08-16: 1 via TRANSDERMAL
  Filled 2023-08-16: qty 1

## 2023-08-16 NOTE — Discharge Instructions (Addendum)
Use Lidocaine patch as prescribed.  You may apply over-the-counter Lotrimin to the small spot of athlete's foot on the bottom of your right foot.  Return to the ER for worsening symptoms or other concerns.

## 2023-08-16 NOTE — ED Triage Notes (Signed)
SEE THIS RN TRIAGE NOTE FROM 08/14/23. No change in patient complaint from that day. Pt did not stay to be seen on that day. Presents today complaining of continued groin and nerve pain to L side. Pt ambulatory to triage. Alert and oriented following commands. Breathing unlabored speaking in full sentences. Pt also reporting concern for athletes foot to L toes and that it is spreading throughout his body despite using multiple wash cloths while bathing. Pt also reporting concern for enlarged prostate and and using the bathroom. Pt again poor historian and speaking with flight of ideas.

## 2023-08-16 NOTE — ED Triage Notes (Signed)
Pt requests to speak with MD prior to new lab draw

## 2023-08-16 NOTE — ED Provider Notes (Signed)
[facility] Provider Note    Event Date/Time   First MD Initiated Contact with Patient 08/16/23 0147     (approximate)   History   Groin Pain   HPI  Joseph Strong is a 52 y.o. male who presents to the ED from home with a chief complaint of left groin numbness.  Patient with a history of meralgia paresthetica status post hernia surgery who presents with chronic nerve pain.  Sees neurology who has him on Lyrica.  He is supposed to take 50mg  but only takes 25mg  due to side effects.  For the same reason, patient did not continue on gabapentin initially.  He was seen in the ED in August for same, prescribed lidocaine patch but did not fill his prescription.  He left without being seen from the ED 2 days ago, had lab work which was unremarkable.  Also concern for kidney dysfunction as he is experiencing paresthesia on the left side.  History of BPH.  Also concern for athletes feet  because he is feeling numb and tingling in his toes.  Denies fever/chills, chest pain, shortness of breath, abdominal pain, nausea, vomiting, dizziness, slurred speech, extremity weakness, confusion.     Past Medical History   Past Medical History:  Diagnosis Date   BPH (benign prostatic hyperplasia)    Burning with urination 01/10/2016   Constipation    Dysrhythmia    "OCCASSIONAL SPEEDING UP OF HEART RATE" PT THINKING IT MAY BE DUE TO HERNIA   Enlarged prostate    Headache    MIGRAINES   Inguinal hernia    left    Scoliosis      Active Problem List   Patient Active Problem List   Diagnosis Date Noted   Lumbar radiculopathy 09/05/2022   Recurrent umbilical hernia with incarceration    Bilateral recurrent inguinal hernia without obstruction or gangrene    Chronic pain syndrome 03/04/2021   Encounter for screening colonoscopy 03/04/2021   Disorder of skeletal system 03/04/2021   Problems influencing health status 03/04/2021   Pancreas divisum 10/09/2018   Constipation  10/09/2018   Ventral hernia without obstruction or gangrene 10/09/2018   Prostate cancer screening 09/16/2017   Abdominal pain, periumbilical 12/20/2016   Decreased frequency of bowel movements 01/10/2016   Paraspinal back pain (Left) 12/08/2015   Dizziness, nonspecific 10/31/2015   Abnormal TSH 10/31/2015   Chronic groin pain (Left) 10/31/2015   Abnormal EKG 10/31/2015   Testicular pain (Left) 09/20/2015   S/P unilateral inguinal hernia repair 09/20/2015     Past Surgical History   Past Surgical History:  Procedure Laterality Date   COLONOSCOPY WITH PROPOFOL N/A 05/10/2022   Procedure: COLONOSCOPY WITH PROPOFOL;  Surgeon: Toney Reil, MD;  Location: [facility] ENDOSCOPY;  Service: Gastroenterology;  Laterality: N/A;   HERNIA REPAIR Left 1998   AND UMBILICAL   INGUINAL HERNIA REPAIR Right 01/11/2016   Procedure: HERNIA REPAIR INGUINAL ADULT;  Surgeon: Kieth Brightly, MD;  Location: [facility] ORS;  Service: General;  Laterality: Right;   INSERTION OF MESH  07/02/2022   Procedure: INSERTION OF MESH- UMBILICAL;  Surgeon: Henrene Dodge, MD;  Location: [facility] ORS;  Service: General;;   TONSILLECTOMY     childhood   UMBILICAL HERNIA REPAIR     infant   UMBILICAL HERNIA REPAIR N/A 07/02/2022   Procedure: HERNIA REPAIR UMBILICAL ADULT open, recurrent; incarcerated;  Surgeon: Henrene Dodge, MD;  Location: [facility] ORS;  Service: General;  Laterality: N/A;   XI ROBOTIC ASSISTED INGUINAL  HERNIA REPAIR WITH MESH Bilateral 07/02/2022   Procedure: XI ROBOTIC ASSISTED INGUINAL HERNIA WITH MESH, recurrent;  Surgeon: Henrene Dodge, MD;  Location: [facility] ORS;  Service: General;  Laterality: Bilateral;     Home Medications   Prior to Admission medications   Medication Sig Start Date End Date Taking? Authorizing Provider  lidocaine (LIDODERM) 5 % Place 1 patch onto the skin daily. Remove & Discard patch within 12 hours or as directed by MD 08/16/23  Yes Irean Hong, MD  pregabalin (LYRICA) 25 MG capsule  Take 25 mg twice a day for 2 weeks, then increase to 50 mg twice a day 12/24/22   [provider]  tadalafil (CIALIS) 5 MG tablet TAKE ONE TABLET BY MOUTH DAILY AS NEEDED FOR ERECTILE DYSFUNCTION 12/26/22   Michiel Cowboy A, PA-C     Allergies  Patient has no known allergies.   Family History   Family History  Problem Relation Age of Onset   Hypertension Maternal Grandmother    Prostate cancer Neg Hx    Kidney cancer Neg Hx    Bladder Cancer Neg Hx      Physical Exam  Triage Vital Signs: ED Triage Vitals [08/16/23 0016]  Encounter Vitals Group     BP (!) 142/104     Systolic BP Percentile      Diastolic BP Percentile      Pulse Rate 71     Resp 18     Temp 98.4 F (36.9 C)     Temp Source Oral     SpO2 98 %     Weight 180 lb 12.4 oz (82 kg)     Height 6' (1.829 m)     Head Circumference      Peak Flow      Pain Score 6     Pain Loc      Pain Education      Exclude from Growth Chart     Updated Vital Signs: BP (!) 142/104 (BP Location: Left Arm) Comment: PT MOVING DURING READING  Pulse 71   Temp 98.4 F (36.9 C) (Oral)   Resp 18   Ht 6' (1.829 m)   Wt 82 kg   SpO2 98%   BMI 24.52 kg/m    General: Awake, no distress.  CV:  RRR.  Good peripheral perfusion.  Resp:  Normal effort.  CTAB. Abd:  Nontender.  No distention.  No CVAT.  No truncal vesicles. Other:  Left hip/groin with diminished sensation which is baseline for patient.  Full range of motion without pain.  5/5 motor strength.  1 small patch of flakiness consistent with athlete's foot to sole of right foot; otherwise both feet examined without flakiness.  ED Results / Procedures / Treatments  Labs (all labs ordered are listed, but only abnormal results are displayed) Labs Reviewed  URINALYSIS, ROUTINE W REFLEX MICROSCOPIC - Abnormal; Notable for the following components:      Result Value   Color, Urine STRAW (*)    APPearance CLEAR (*)    All other components within normal limits      EKG  None   RADIOLOGY None   Official radiology report(s): No results found.   PROCEDURES:  Critical Care performed: No  Procedures   MEDICATIONS ORDERED IN ED: Medications  lidocaine (LIDODERM) 5 % 1 patch (1 patch Transdermal Patch Applied 08/16/23 0206)     IMPRESSION / MDM / ASSESSMENT AND PLAN / ED COURSE  I reviewed the triage vital signs  and the nursing notes.                             52 year old male presenting with chronic numbness to his left groin/hip secondary to meralgia paresthetica.  Labs reviewed from 08/14/2023 which are unremarkable.  Patient providing urine specimen now.  Willing to try lidocaine patch again which she did not fill from last visit.  Recommended OTC Lotrimin for small spot of flakiness to right sole of foot.  Strict return precautions given.  Patient verbalizes understanding and agrees with plan of care.  Patient's presentation is most consistent with acute, uncomplicated illness.  0225 UA unremarkable.  Patient has upcoming neurology appointment.  Strict return precautions given.  Patient verbalizes understanding and agrees with plan of care.  FINAL CLINICAL IMPRESSION(S) / ED DIAGNOSES   Final diagnoses:  Meralgia paraesthetica, left  Other polyneuropathy  Athlete's foot on right     Rx / DC Orders   ED Discharge Orders          Ordered    lidocaine (LIDODERM) 5 %  Every 24 hours        08/16/23 0209             Note:  This document was prepared using Dragon voice recognition software and may include unintentional dictation errors.   Irean Hong, MD 08/16/23 [phone]

## 2023-08-19 ENCOUNTER — Ambulatory Visit (INDEPENDENT_AMBULATORY_CARE_PROVIDER_SITE_OTHER): Payer: 59 | Admitting: Physician Assistant

## 2023-08-19 ENCOUNTER — Encounter: Payer: Self-pay | Admitting: Physician Assistant

## 2023-08-19 VITALS — BP 122/72 | HR 93 | Temp 98.0°F | Resp 16 | Ht 72.0 in | Wt 195.7 lb

## 2023-08-19 DIAGNOSIS — R42 Dizziness and giddiness: Secondary | ICD-10-CM | POA: Diagnosis not present

## 2023-08-19 NOTE — Progress Notes (Signed)
Acute Office Visit   Patient: Joseph Strong   DOB: [birth year]   52 y.o. Male  MRN: [id number] Visit Date: 08/19/2023  Today's healthcare provider: Oswaldo Conroy Lima Chillemi, PA-C  Introduced myself to the patient as a Secondary school teacher and provided education on APPs in clinical practice.    Chief Complaint  Patient presents with   Headache    Ongoing for a while   Dizziness   Subjective    HPI HPI     Headache    Additional comments: Ongoing for a while      Last edited by Forde Radon, CMA on 08/19/2023  3:03 PM.        He is worried about headache and dizziness today   Onset: gradual  Duration: intermittent but seems to be happening more frequently over the past few days He feels like he is pitching to side  Associated symptoms: dizziness  Pain level and character: mild and located on right temple He reports having hx of migraines in the past  Interventions: none- has upcoming Neurology apt  Aggravating: standing and walking  Alleviating: unsure    He states when he has pain in left inguinal area he also has pressure in LUQ with radiation to left flank  He states he will also get dizziness and headaches when the pain starts in his left inguinal area  He reports he feels like he will fall or pass out sometimes  He reports feeling weakness in left lower extremity in hip, thigh area especially when standing  He reports pain in his left groin  He reports hx of hernia operations - most recent one was 07/02/22  He reports numbness and tingling in hands and arms    He was seen in ED on 08/16/23 for meralgia paraesthetica, athletes foot and polyneuropathy Reviewed labs, EKG and ED visit notes      Medications: Outpatient Medications Prior to Visit  Medication Sig   lidocaine (LIDODERM) 5 % Place 1 patch onto the skin daily. Remove & Discard patch within 12 hours or as directed by MD   pregabalin (LYRICA) 25 MG capsule Take 25 mg twice a day for 2 weeks, then  increase to 50 mg twice a day   tadalafil (CIALIS) 5 MG tablet TAKE ONE TABLET BY MOUTH DAILY AS NEEDED FOR ERECTILE DYSFUNCTION   No facility-administered medications prior to visit.    Review of Systems  Neurological:  Positive for dizziness and headaches.        Objective    BP 122/72   Pulse 93   Temp 98 F (36.7 C) (Oral)   Resp 16   Ht 6' (1.829 m)   Wt 195 lb 11.2 oz (88.8 kg)   SpO2 98%   BMI 26.54 kg/m     Physical Exam Vitals reviewed.  Constitutional:      General: He is awake.     Appearance: Normal appearance. He is well-developed and well-groomed.  HENT:     Head: Normocephalic and atraumatic.     Right Ear: Hearing, tympanic membrane and ear canal normal.     Left Ear: Hearing, tympanic membrane and ear canal normal.  Eyes:     General: Lids are normal. Gaze aligned appropriately.     Extraocular Movements: Extraocular movements intact.     Right eye: Normal extraocular motion and no nystagmus.     Left eye: Normal extraocular motion and no nystagmus.     Conjunctiva/sclera: Conjunctivae  normal.     Pupils: Pupils are equal, round, and reactive to light.  Cardiovascular:     Rate and Rhythm: Normal rate and regular rhythm.     Pulses:          Radial pulses are 2+ on the right side and 2+ on the left side.     Heart sounds: Normal heart sounds. No murmur heard.    No friction rub. No gallop.  Pulmonary:     Effort: Pulmonary effort is normal.     Breath sounds: Normal breath sounds. No decreased air movement. No decreased breath sounds, wheezing, rhonchi or rales.  Musculoskeletal:     Cervical back: Normal range of motion.     Right lower leg: No edema.     Left lower leg: No edema.  Neurological:     Mental Status: He is alert and oriented to person, place, and time.     GCS: GCS eye subscore is 4. GCS verbal subscore is 5. GCS motor subscore is 6.     Cranial Nerves: No cranial nerve deficit, dysarthria or facial asymmetry.     Motor: No  weakness, tremor, atrophy or abnormal muscle tone.     Gait: Gait is intact.     Deep Tendon Reflexes:     Reflex Scores:      Patellar reflexes are 2+ on the right side and 2+ on the left side.    Comments: Comments: MENTAL STATUS: AAOx3, memory intact, fund of knowledge appropriate   LANG/SPEECH: Naming and repetition intact, fluent, no dysarthria, follows 3-step commands, answers questions appropriately     CRANIAL NERVES:   II: Pupils equal and reactive, no RAPD   III, IV, VI: EOM intact, no gaze preference or deviation, no nystagmus.   V: normal sensation in V1, V2, and V3 segments bilaterally   VII: no asymmetry, no nasolabial fold flattening   VIII: normal hearing to speech   IX, X: normal palatal elevation, no uvular deviation   XI: 5/5 head turn and 5/5 shoulder shrug bilaterally   XII: midline tongue protrusion   MOTOR:  5/5 bilateral grip strength 5/5 strength hip flexion, dorsiflexion/plantarflexion b/l    COORD:  no tremor, no dysmetria   STATION: normal stance, no truncal ataxia   GAIT: Normal;   Psychiatric:        Attention and Perception: Attention and perception normal.        Mood and Affect: Affect normal. Mood is anxious.        Speech: Speech is rapid and pressured.        Behavior: Behavior normal. Behavior is cooperative.       No results found for any visits on 08/19/23.  Assessment & Plan      No follow-ups on file.      Problem List Items Addressed This Visit       Other   Dizziness, nonspecific - Primary    Unsure of chronicity of dizziness as patient is rather tangential and somewhat distracted historian. At times he reports dizziness upon standing, sensation that he is tilted when standing upright, presyncope This does appear to have been ongoing for at least several weeks-reviewed most recent emergency room visit notes as well as EKG, labs which all appear to be normal and reassuring Neuroexam was reassuring today Patient does  have upcoming appointment with neurology in 3 days.  I recommend that he discusses his left lower extremity numbness and perceived weakness with neurology as well as  dizziness and headaches. Patient was rather insistent today and requests for a scan of some kind for further evaluation. We discussed at length that his symptoms appear to be more consistent with likely vertigo and imaging is not typically indicated for this. Patient's complaints were rather hard to follow as he did report left upper quadrant pain and burning at times with some radiation into the left flank without dysuria, fever, increased urinary frequency.  He reports that this pain is sometimes linked to his dizziness and headaches. Will try to treat dizziness per suspected inner ear issues with antihistamine and Flonase along with vestibular rehab.  I reviewed with him that it can take several weeks for medications to provide notable relief. we did discuss that increased anxiety can cause headaches and acid reflux which might explain some of his symptoms especially since he appears to have a significant amount of health related anxiety. Will defer to neurology for potential head imaging or lower extremity imaging based on their findings and evaluation. Also recommend the patient make make sure that he is eating regularly throughout the day and staying well-hydrated to avoid low blood sugar or hypotensive episodes He has follow-up appointment on 09/02/2023 with his PCP and may need to address concerns further with her.      Relevant Orders   Ambulatory referral to Physical Therapy   A significant amount of time was spent discussing patient concerns as well as potential etiologies and management plans.  This time totaled to approximately 45 minutes   No follow-ups on file.   I, Deisi Salonga E Jansen Goodpasture, PA-C, have reviewed all documentation for this visit. The documentation on 08/19/23 for the exam, diagnosis, procedures, and orders are all  accurate and complete.   Jacquelin Hawking, MHS, PA-C [facility]

## 2023-08-19 NOTE — Patient Instructions (Addendum)
To help with your dizziness I recommend the following:  I also recommend adding an antihistamine to your daily regimen This includes medications like Claritin, Allegra, Zyrtec- the generics of these work very well and are usually less expensive I recommend using Flonase nasal spray - 2 puffs twice per day to help with your nasal congestion The antihistamines and Flonase can take a few weeks to provide significant relief from allergy symptoms but should start to provide some benefit soon.  I have placed a referral for you to go to Vestibular rehab

## 2023-08-19 NOTE — Assessment & Plan Note (Signed)
Unsure of chronicity of dizziness as patient is rather tangential and somewhat distracted historian. At times he reports dizziness upon standing, sensation that he is tilted when standing upright, presyncope This does appear to have been ongoing for at least several weeks-reviewed most recent emergency room visit notes as well as EKG, labs which all appear to be normal and reassuring Neuroexam was reassuring today Patient does have upcoming appointment with neurology in 3 days.  I recommend that he discusses his left lower extremity numbness and perceived weakness with neurology as well as dizziness and headaches. Patient was rather insistent today and requests for a scan of some kind for further evaluation. We discussed at length that his symptoms appear to be more consistent with likely vertigo and imaging is not typically indicated for this. Patient's complaints were rather hard to follow as he did report left upper quadrant pain and burning at times with some radiation into the left flank without dysuria, fever, increased urinary frequency.  He reports that this pain is sometimes linked to his dizziness and headaches. Will try to treat dizziness per suspected inner ear issues with antihistamine and Flonase along with vestibular rehab.  I reviewed with him that it can take several weeks for medications to provide notable relief. we did discuss that increased anxiety can cause headaches and acid reflux which might explain some of his symptoms especially since he appears to have a significant amount of health related anxiety. Will defer to neurology for potential head imaging or lower extremity imaging based on their findings and evaluation. Also recommend the patient make make sure that he is eating regularly throughout the day and staying well-hydrated to avoid low blood sugar or hypotensive episodes He has follow-up appointment on 09/02/2023 with his PCP and may need to address concerns further with  her.

## 2023-08-25 ENCOUNTER — Other Ambulatory Visit: Payer: Self-pay | Admitting: Neurology

## 2023-08-25 DIAGNOSIS — H532 Diplopia: Secondary | ICD-10-CM

## 2023-08-25 DIAGNOSIS — R42 Dizziness and giddiness: Secondary | ICD-10-CM

## 2023-09-01 ENCOUNTER — Ambulatory Visit
Admission: EM | Admit: 2023-09-01 | Discharge: 2023-09-01 | Disposition: A | Discharge status: Home / Self Care | Payer: 59 | Attending: Physician Assistant | Admitting: Physician Assistant

## 2023-09-01 ENCOUNTER — Other Ambulatory Visit: Payer: Self-pay

## 2023-09-01 DIAGNOSIS — B354 Tinea corporis: Secondary | ICD-10-CM | POA: Diagnosis not present

## 2023-09-01 DIAGNOSIS — R1031 Right lower quadrant pain: Secondary | ICD-10-CM

## 2023-09-01 DIAGNOSIS — K0889 Other specified disorders of teeth and supporting structures: Secondary | ICD-10-CM

## 2023-09-01 MED ORDER — CLOTRIMAZOLE 1 % EX CREA: TOPICAL_CREAM | CUTANEOUS | 0 refills | Status: DC

## 2023-09-01 NOTE — ED Provider Notes (Signed)
[facility]    CSN: [id number] Arrival date & time: 09/01/23  1316      History   Chief Complaint Chief Complaint  Patient presents with   Abdominal Pain    HPI Joseph Strong is a 52 y.o. male.   Patient reports that he has history of bilateral inguinal hernias and umbilical hernia. States he had hernia repair last year and he will still have pain from time to time.  In 1998, he had left inguinal hernia repair. In 2017 he had right inguinal hernia repair and reports that last year he had surgery of both inguinal hernias and umbilical hernia.   He reports worsening pain of the right lower abdomen/groin upon waking up today. Believes it could be related to the hernia. Sometimes has increased pain with bending forward and walking. No back pain. Pain radiates to right thigh mildly. Taking Tylenol, which helps.  Denies fever, fatigue, appetite changes, nausea, vomiting, diarrhea, dysuria, flank pain, hematuria, urinary urgency, or constipation.   Patient also reports dental pain of the right upper side. States he had a tooth pulled in this area 7-8 months ago. Has had increased dental pain for a few days. Has been using Orajel.   Also reporting intermittent rash of right lower abdomen.  He says he has an appointment with PCP tomorrow, but his employer insisted he be assessed today.  HPI  Past Medical History:  Diagnosis Date   BPH (benign prostatic hyperplasia)    Burning with urination 01/10/2016   Constipation    Dysrhythmia    "OCCASSIONAL SPEEDING UP OF HEART RATE" PT THINKING IT MAY BE DUE TO HERNIA   Enlarged prostate    Headache    MIGRAINES   Inguinal hernia    left    Scoliosis     Patient Active Problem List   Diagnosis Date Noted   Lumbar radiculopathy 09/05/2022   Recurrent umbilical hernia with incarceration    Bilateral recurrent inguinal hernia without obstruction or gangrene    Chronic pain syndrome 03/04/2021   Encounter for screening  colonoscopy 03/04/2021   Disorder of skeletal system 03/04/2021   Problems influencing health status 03/04/2021   Pancreas divisum 10/09/2018   Constipation 10/09/2018   Ventral hernia without obstruction or gangrene 10/09/2018   Prostate cancer screening 09/16/2017   Abdominal pain, periumbilical 12/20/2016   Decreased frequency of bowel movements 01/10/2016   Paraspinal back pain (Left) 12/08/2015   Dizziness, nonspecific 10/31/2015   Abnormal TSH 10/31/2015   Chronic groin pain (Left) 10/31/2015   Abnormal EKG 10/31/2015   Testicular pain (Left) 09/20/2015   S/P unilateral inguinal hernia repair 09/20/2015    Past Surgical History:  Procedure Laterality Date   COLONOSCOPY WITH PROPOFOL N/A 05/10/2022   Procedure: COLONOSCOPY WITH PROPOFOL;  Surgeon: Toney Reil, MD;  Location: [facility] ENDOSCOPY;  Service: Gastroenterology;  Laterality: N/A;   HERNIA REPAIR Left 1998   AND UMBILICAL   INGUINAL HERNIA REPAIR Right 01/11/2016   Procedure: HERNIA REPAIR INGUINAL ADULT;  Surgeon: Kieth Brightly, MD;  Location: [facility] ORS;  Service: General;  Laterality: Right;   INSERTION OF MESH  07/02/2022   Procedure: INSERTION OF MESH- UMBILICAL;  Surgeon: Henrene Dodge, MD;  Location: [facility] ORS;  Service: General;;   TONSILLECTOMY     childhood   UMBILICAL HERNIA REPAIR     infant   UMBILICAL HERNIA REPAIR N/A 07/02/2022   Procedure: HERNIA REPAIR UMBILICAL ADULT open, recurrent; incarcerated;  Surgeon: Henrene Dodge, MD;  Location:  [facility] ORS;  Service: General;  Laterality: N/A;   XI ROBOTIC ASSISTED INGUINAL HERNIA REPAIR WITH MESH Bilateral 07/02/2022   Procedure: XI ROBOTIC ASSISTED INGUINAL HERNIA WITH MESH, recurrent;  Surgeon: Henrene Dodge, MD;  Location: [facility] ORS;  Service: General;  Laterality: Bilateral;       Home Medications    Prior to Admission medications   Medication Sig Start Date End Date Taking? Authorizing Provider  clotrimazole (LOTRIMIN) 1 % cream Apply to  affected area 2 times daily 09/01/23  Yes Eusebio Friendly B, PA-C  lidocaine (LIDODERM) 5 % Place 1 patch onto the skin daily. Remove & Discard patch within 12 hours or as directed by MD 08/16/23  Yes Irean Hong, MD  tadalafil (CIALIS) 5 MG tablet TAKE ONE TABLET BY MOUTH DAILY AS NEEDED FOR ERECTILE DYSFUNCTION 12/26/22  Yes McGowan, Carollee Herter A, PA-C  pregabalin (LYRICA) 25 MG capsule Take 25 mg twice a day for 2 weeks, then increase to 50 mg twice a day 12/24/22   [provider]    Family History Family History  Problem Relation Age of Onset   Hypertension Maternal Grandmother    Prostate cancer Neg Hx    Kidney cancer Neg Hx    Bladder Cancer Neg Hx     Social History Social History   Tobacco Use   Smoking status: Some Days    Types: Cigars    Passive exposure: Past   Smokeless tobacco: Never  Vaping Use   Vaping status: Never Used  Substance Use Topics   Alcohol use: Yes    Alcohol/week: 0.0 standard drinks of alcohol    Comment: occasional   Drug use: No     Allergies   Patient has no known allergies.   Review of Systems Review of Systems  Constitutional:  Negative for appetite change, fatigue and fever.  HENT:  Positive for dental problem. Negative for facial swelling.   Respiratory:  Negative for shortness of breath.   Cardiovascular:  Negative for chest pain.  Gastrointestinal:  Positive for abdominal pain. Negative for anal bleeding, blood in stool, constipation, diarrhea, nausea and vomiting.  Genitourinary:  Negative for difficulty urinating, dysuria, frequency, hematuria, testicular pain and urgency.  Musculoskeletal:  Negative for back pain.  Skin:  Positive for rash.  Neurological:  Negative for dizziness, weakness and headaches.     Physical Exam Triage Vital Signs ED Triage Vitals  Encounter Vitals Group     BP      Systolic BP Percentile      Diastolic BP Percentile      Pulse      Resp      Temp      Temp src      SpO2      Weight       Height      Head Circumference      Peak Flow      Pain Score      Pain Loc      Pain Education      Exclude from Growth Chart    No data found.  Updated Vital Signs BP (!) 131/92   Pulse 94   Temp 98.4 F (36.9 C)   Resp 18   SpO2 100%      Physical Exam Vitals and nursing note reviewed.  Constitutional:      General: He is not in acute distress.    Appearance: Normal appearance. He is well-developed. He is not ill-appearing.  HENT:  Head: Normocephalic and atraumatic.     Nose: Nose normal.     Mouth/Throat:     Mouth: Mucous membranes are moist.     Pharynx: Oropharynx is clear.     Comments: No obvious dental abnormalities Eyes:     General: No scleral icterus.    Conjunctiva/sclera: Conjunctivae normal.  Cardiovascular:     Rate and Rhythm: Normal rate and regular rhythm.  Pulmonary:     Effort: Pulmonary effort is normal. No respiratory distress.     Breath sounds: Normal breath sounds.  Abdominal:     Palpations: Abdomen is soft.     Tenderness: There is abdominal tenderness (RLQ, right inguinal region. No clear hernia.). There is no right CVA tenderness, left CVA tenderness or guarding.  Musculoskeletal:     Cervical back: Neck supple.     Comments: No TTP of back or hip and full ROM  Skin:    General: Skin is warm and dry.     Capillary Refill: Capillary refill takes less than 2 seconds.     Findings: Rash (dry erythemaous circular rash with central clearing right lower abdomen) present.  Neurological:     General: No focal deficit present.     Mental Status: He is alert. Mental status is at baseline.     Motor: No weakness.     Gait: Gait normal.  Psychiatric:        Mood and Affect: Mood normal.        Behavior: Behavior normal.      UC Treatments / Results  Labs (all labs ordered are listed, but only abnormal results are displayed) Labs Reviewed - No data to display  EKG   Radiology No results found.  Procedures Procedures  (including critical care time)  Medications Ordered in UC Medications - No data to display  Initial Impression / Assessment and Plan / UC Course  I have reviewed the triage vital signs and the nursing notes.  Pertinent labs & imaging results that were available during my care of the patient were reviewed by me and considered in my medical decision making (see chart for details).   52 year old male presents for 3 separate issues.  #1 right groin/right lower abdominal pain: Patient has history of multiple hernia surgeries.  Having discomfort of the right lower quadrant with radiation to the right groin and slightly to the right thigh.  He says this is consistent with when he has had pain related to hernia in the past.  Has had 4 separate hernia surgeries with the last one being last year.  On exam there is no obvious hernia but he has tenderness in the inguinal region.  No associated fever, changes in bowel habits, urinary symptoms.  Suspect his discomfort is likely related to possible hernia or scarring postsurgical.  Patient has appointment with PCP tomorrow.  Advised that he keep this appointment.  He may need to have ultrasound performed.  I explained that if he develops worsening pain or has fever he should go to the emergency department for further workup of acute intra-abdominal process.  #2 rash/tinea corporis: Will treat with clotrimazole topical.  #3 dental pain: No obvious dental abnormalities.  Suggested patient follow-up with dentist for imaging and further care.   Final Clinical Impressions(s) / UC Diagnoses   Final diagnoses:  Right groin pain  Pain, dental  Tinea corporis     Discharge Instructions      -Please follow-up with your PCP tomorrow regarding the groin pain.  You may need ultrasound of the area to see if you are having a problem with your mesh/previous hernia surgery. - If you develop fever, nausea/vomiting, weakness, body aches, urinary symptoms, blood in  urine, flank pain/back pain, swelling in your groin, testicular pain please go to emergency department. - Make a follow-up appoint with your dentist regarding your dental pain. - I sent a topical cream for your ringworm.     ED Prescriptions     Medication Sig Dispense Auth. Provider   clotrimazole (LOTRIMIN) 1 % cream Apply to affected area 2 times daily 28 g Shirlee Latch, PA-C      I have reviewed the PDMP during this encounter.   Shirlee Latch, PA-C 09/01/23 [id number]

## 2023-09-01 NOTE — Discharge Instructions (Addendum)
-  Please follow-up with your PCP tomorrow regarding the groin pain.  You may need ultrasound of the area to see if you are having a problem with your mesh/previous hernia surgery. - If you develop fever, nausea/vomiting, weakness, body aches, urinary symptoms, blood in urine, flank pain/back pain, swelling in your groin, testicular pain please go to emergency department. - Make a follow-up appoint with your dentist regarding your dental pain. - I sent a topical cream for your ringworm.

## 2023-09-01 NOTE — ED Triage Notes (Signed)
RLQ abd pain and right groin area pain. Denies fever nausea and vomiting. Pt also c/o tooth pain

## 2023-09-02 ENCOUNTER — Ambulatory Visit (INDEPENDENT_AMBULATORY_CARE_PROVIDER_SITE_OTHER): Payer: 59 | Admitting: Internal Medicine

## 2023-09-02 ENCOUNTER — Other Ambulatory Visit (HOSPITAL_COMMUNITY)
Admission: RE | Admit: 2023-09-02 | Discharge: 2023-09-02 | Disposition: A | Discharge status: Home / Self Care | Payer: 59 | Source: Ambulatory Visit | Attending: Internal Medicine | Admitting: Internal Medicine

## 2023-09-02 ENCOUNTER — Encounter: Payer: Self-pay | Admitting: Internal Medicine

## 2023-09-02 VITALS — BP 118/68 | HR 113 | Temp 97.8°F | Resp 18 | Ht 72.0 in | Wt 191.5 lb

## 2023-09-02 DIAGNOSIS — Z23 Encounter for immunization: Secondary | ICD-10-CM | POA: Diagnosis not present

## 2023-09-02 DIAGNOSIS — R7989 Other specified abnormal findings of blood chemistry: Secondary | ICD-10-CM | POA: Diagnosis not present

## 2023-09-02 DIAGNOSIS — Z113 Encounter for screening for infections with a predominantly sexual mode of transmission: Secondary | ICD-10-CM

## 2023-09-02 DIAGNOSIS — R1031 Right lower quadrant pain: Secondary | ICD-10-CM

## 2023-09-02 LAB — POCT URINALYSIS DIPSTICK
Bilirubin, UA: NEGATIVE
Blood, UA: NEGATIVE
Glucose, UA: NEGATIVE
Ketones, UA: NEGATIVE
Leukocytes, UA: NEGATIVE
Nitrite, UA: NEGATIVE
Odor: NORMAL
Protein, UA: NEGATIVE
Spec Grav, UA: 1.02 (ref 1.010–1.025)
Urobilinogen, UA: 0.2 U/dL
pH, UA: 5.5 (ref 5.0–8.0)

## 2023-09-02 NOTE — Patient Instructions (Addendum)
Please call to schedule Ultrasound at [phone]

## 2023-09-02 NOTE — Progress Notes (Signed)
Established Patient Office Visit  Subjective   Patient ID: Joseph Strong, male    DOB: 1971-07-07  Age: 52 y.o. MRN: [id number]  Chief Complaint  Patient presents with   Groin Pain    right    Groin Pain Pertinent negatives include no chills, dysuria, fever, frequency or urgency.  Patient is here today for right groin pain and swelling.  Patient has a history of chronic left groin pain after bilateral inguinal hernia repair and open umbilical hernia repair with mesh on 07/02/2022.  He is now seeing neurology for this chronic issue and is waiting for an MRI of the brain and EMG to be scheduled.  He is currently on clonazepam 0.25 mg twice daily per neurology.  Today patient states that about a week ago he had similar symptoms in his right groin.  This includes numbness and tingling radiating down the anterior aspect of his right leg.  He does endorse some mild inguinal swelling.  He denies urinary symptoms and is not currently sexually active.  He was seen in the ER for his chronic issue on 08/16/2023, blood work and workup at that time was negative.   Patient Active Problem List   Diagnosis Date Noted   Lumbar radiculopathy 09/05/2022   Recurrent umbilical hernia with incarceration    Bilateral recurrent inguinal hernia without obstruction or gangrene    Chronic pain syndrome 03/04/2021   Encounter for screening colonoscopy 03/04/2021   Disorder of skeletal system 03/04/2021   Problems influencing health status 03/04/2021   Pancreas divisum 10/09/2018   Constipation 10/09/2018   Ventral hernia without obstruction or gangrene 10/09/2018   Prostate cancer screening 09/16/2017   Abdominal pain, periumbilical 12/20/2016   Decreased frequency of bowel movements 01/10/2016   Paraspinal back pain (Left) 12/08/2015   Dizziness, nonspecific 10/31/2015   Abnormal TSH 10/31/2015   Chronic groin pain (Left) 10/31/2015   Abnormal EKG 10/31/2015   Testicular pain (Left) 09/20/2015   S/P  unilateral inguinal hernia repair 09/20/2015   Past Medical History:  Diagnosis Date   BPH (benign prostatic hyperplasia)    Burning with urination 01/10/2016   Constipation    Dysrhythmia    "OCCASSIONAL SPEEDING UP OF HEART RATE" PT THINKING IT MAY BE DUE TO HERNIA   Enlarged prostate    Headache    MIGRAINES   Inguinal hernia    left    Scoliosis    Past Surgical History:  Procedure Laterality Date   COLONOSCOPY WITH PROPOFOL N/A 05/10/2022   Procedure: COLONOSCOPY WITH PROPOFOL;  Surgeon: Toney Reil, MD;  [facility] ENDOSCOPY;  Service: Gastroenterology;  Laterality: N/A;   HERNIA REPAIR Left 1998   AND UMBILICAL   INGUINAL HERNIA REPAIR Right 01/11/2016   Procedure: HERNIA REPAIR INGUINAL ADULT;  Surgeon: Kieth Brightly, MD;  Location: [facility] ORS;  Service: General;  Laterality: Right;   INSERTION OF MESH  07/02/2022   Procedure: INSERTION OF MESH- UMBILICAL;  Surgeon: Henrene Dodge, MD;  Location: [facility] ORS;  Service: General;;   TONSILLECTOMY     childhood   UMBILICAL HERNIA REPAIR     infant   UMBILICAL HERNIA REPAIR N/A 07/02/2022   Procedure: HERNIA REPAIR UMBILICAL ADULT open, recurrent; incarcerated;  Surgeon: Henrene Dodge, MD;  Location: [facility] ORS;  Service: General;  Laterality: N/A;   XI ROBOTIC ASSISTED INGUINAL HERNIA REPAIR WITH MESH Bilateral 07/02/2022   Procedure: XI ROBOTIC ASSISTED INGUINAL HERNIA WITH MESH, recurrent;  Surgeon: Henrene Dodge, MD;  Location: [facility] ORS;  Service: General;  Laterality: Bilateral;   Social History   Tobacco Use   Smoking status: Some Days    Types: Cigars    Passive exposure: Past   Smokeless tobacco: Never  Vaping Use   Vaping status: Never Used  Substance Use Topics   Alcohol use: Yes    Alcohol/week: 0.0 standard drinks of alcohol    Comment: occasional   Drug use: No   Social History   Socioeconomic History   Marital status: Single    Spouse name: Not on file   Number of children: 1   Years of  education: 11   Highest education level: 11th grade  Occupational History   Not on file  Tobacco Use   Smoking status: Some Days    Types: Cigars    Passive exposure: Past   Smokeless tobacco: Never  Vaping Use   Vaping status: Never Used  Substance and Sexual Activity   Alcohol use: Yes    Alcohol/week: 0.0 standard drinks of alcohol    Comment: occasional   Drug use: No   Sexual activity: Yes  Other Topics Concern   Not on file  Social History Narrative   Not on file   Social Determinants of Health   Financial Resource Strain: Patient Declined (03/04/2023)   Overall Financial Resource Strain (CARDIA)    Difficulty of Paying Living Expenses: Patient declined  Food Insecurity: Patient Declined (03/04/2023)   Hunger Vital Sign    Worried About Running Out of Food in the Last Year: Patient declined    Ran Out of Food in the Last Year: Patient declined  Transportation Needs: No Transportation Needs (02/21/2022)   PRAPARE - Administrator, Civil Service (Medical): No    Lack of Transportation (Non-Medical): No  Physical Activity: Insufficiently Active (03/04/2023)   Exercise Vital Sign    Days of Exercise per Week: 3 days    Minutes of Exercise per Session: 10 min  Stress: No Stress Concern Present (03/04/2023)   [employer] of Occupational Health - Occupational Stress Questionnaire    Feeling of Stress : Not at all  Social Connections: Moderately Isolated (03/04/2023)   Social Connection and Isolation Panel [NHANES]    Frequency of Communication with Friends and Family: Once a week    Frequency of Social Gatherings with Friends and Family: More than three times a week    Attends Religious Services: More than 4 times per year    Active Member of [employer] or Organizations: No    Attends Banker Meetings: Never    Marital Status: Never married  Intimate Partner Violence: Not At Risk (03/04/2023)   Humiliation, Afraid, Rape, and Kick questionnaire    Fear of  Current or Ex-Partner: No    Emotionally Abused: No    Physically Abused: No    Sexually Abused: No   Family Status  Relation Name Status   Mother  Deceased   Father  Deceased   MGM  Alive   Neg Hx  (Not Specified)  No partnership data on file   Family History  Problem Relation Age of Onset   Hypertension Maternal Grandmother    Prostate cancer Neg Hx    Kidney cancer Neg Hx    Bladder Cancer Neg Hx    No Known Allergies    Review of Systems  Constitutional:  Negative for chills and fever.  Genitourinary:  Negative for dysuria, frequency and urgency.  Neurological:  Positive for sensory change and weakness.  Objective:     BP 118/68   Pulse (!) 113   Temp 97.8 F (36.6 C)   Resp 18   Ht 6' (1.829 m)   Wt 191 lb 8 oz (86.9 kg)   SpO2 98%   BMI 25.97 kg/m  BP Readings from Last 3 Encounters:  09/02/23 118/68  09/01/23 (!) 131/92  08/19/23 122/72   Wt Readings from Last 3 Encounters:  09/02/23 191 lb 8 oz (86.9 kg)  08/19/23 195 lb 11.2 oz (88.8 kg)  08/16/23 180 lb 12.4 oz (82 kg)      Physical Exam Constitutional:      Appearance: Normal appearance.  HENT:     Head: Normocephalic and atraumatic.  Eyes:     Conjunctiva/sclera: Conjunctivae normal.  Cardiovascular:     Pulses:          Dorsalis pedis pulses are 2+ on the right side and 2+ on the left side.  Skin:    General: Skin is warm and dry.  Neurological:     General: No focal deficit present.     Mental Status: He is alert. Mental status is at baseline.     Gait: Gait normal.  Psychiatric:        Mood and Affect: Mood normal.        Behavior: Behavior normal.      Results for orders placed or performed in visit on 09/02/23  POCT urinalysis dipstick  Result Value Ref Range   Color, UA yellow    Clarity, UA clear    Glucose, UA Negative Negative   Bilirubin, UA neg    Ketones, UA neg    Spec Grav, UA 1.020 1.010 - 1.025   Blood, UA neg    pH, UA 5.5 5.0 - 8.0   Protein, UA  Negative Negative   Urobilinogen, UA 0.2 0.2 or 1.0 E.U./dL   Nitrite, UA neg    Leukocytes, UA Negative Negative   Appearance clear    Odor normal     Last CBC Lab Results  Component Value Date   WBC 5.3 08/13/2023   HGB 15.5 08/13/2023   HCT 46.2 08/13/2023   MCV 85.1 08/13/2023   MCH 28.5 08/13/2023   RDW 14.0 08/13/2023   PLT 219 08/13/2023   Last metabolic panel Lab Results  Component Value Date   GLUCOSE 105 (H) 08/13/2023   NA 138 08/13/2023   K 3.6 08/13/2023   CL 108 08/13/2023   CO2 25 08/13/2023   BUN 13 08/13/2023   CREATININE 1.03 08/13/2023   EGFR 63 03/04/2023   CALCIUM 8.8 (L) 08/13/2023   PROT 7.2 08/13/2023   ALBUMIN 4.0 08/13/2023   LABGLOB 2.8 07/10/2017   AGRATIO 1.5 07/10/2017   BILITOT 0.7 08/13/2023   ALKPHOS 52 08/13/2023   AST 19 08/13/2023   ALT 20 08/13/2023   ANIONGAP 5 08/13/2023   Last lipids Lab Results  Component Value Date   CHOL 148 03/04/2023   HDL 47 03/04/2023   LDLCALC 81 03/04/2023   TRIG 102 03/04/2023   CHOLHDL 3.1 03/04/2023   Last hemoglobin A1c Lab Results  Component Value Date   HGBA1C 5.5 02/21/2022   Last thyroid functions Lab Results  Component Value Date   TSH 0.18 (L) 09/20/2022   T4TOTAL 7.3 04/26/2022   Last vitamin D Lab Results  Component Value Date   VD25OH 29 (L) 09/20/2022   Last vitamin B12 and Folate Lab Results  Component Value Date   VITAMINB12 819  09/20/2022   FOLATE >24.0 09/20/2022      The 10-year ASCVD risk score (Arnett DK, et al., 2019) is: 8%    Assessment & Plan:   1. Right groin pain: Now having similar symptoms on the right side that he is having chronically on the left side.  He is following with neurology for this and is waiting for MRI of the brain and EMG to be scheduled.  He does have an appointment with his general surgeon later this month.  Physical exam benign today.  Will repeat some labs and obtain ultrasound of the right lower extremity.  Urine analysis  negative.  Work note given for today and tomorrow.  - POCT urinalysis dipstick - TSH - Vitamin D (25 hydroxy) - B12 and Folate Panel - US Venous Img Lower Unilateral Right; Future  2. Screening examination for STD (sexually transmitted disease): STD screening obtained today, patient declines HIV screening.  - RPR - Urine cytology ancillary only  3. Need for influenza vaccination: Flu vaccine administered today.   - Flu vaccine trivalent PF, 6mos and older(Flulaval,Afluria,Fluarix,Fluzone)   Return if symptoms worsen or fail to improve.    Margarita Mail, DO

## 2023-09-02 NOTE — Progress Notes (Unsigned)
12/16/2018  4:05 PM   Joseph Strong 1971/11/12 [id number]  Referring provider: Margarita Mail, DO [address] [place],  [place] [address]  Urological history: 1. BPH with LU TS -PSA (12/2022) 0.9 - cysto (2018) NED - tadalafil 5 mg daily  2. Chronic pelvic pain/dysuria - scrotal US x 2 - NED - CT scans x 2 - NED - MRI NED - colonoscopy (04/2022) - NED  3. ED -Contributing factors of age, BPH, smoking - tadalafil 5 mg daily  4. Prostate cancer screening -PSA (12/2022) 0.9 -baseline PSA (2017) 0.7 at age 39 (slightly > median for age group) -African ancestry -No family history of prostate cancer, breast cancer or ovarian cancer   HPI: Joseph Strong is a 52 y.o. male who presents for follow up.   Previous records reviewed.   He is a having some issues with right lower quadrant pain.  He has been following up with general surgery to see if it was related to scar tissue from hernia surgery.  He has an ultrasound scheduled for Monday.  But since he has been having the symptoms, he is now worried about his prostate and would like his PSA drawn and also his urine checked as he states he is burning with urination all the time now.  Patient denies any modifying or aggravating factors.  Patient denies any recent UTI's, gross hematuria or suprapubic/flank pain.  Patient denies any fevers, chills, nausea or vomiting.    UA clear  PMH: Past Medical History:  Diagnosis Date   BPH (benign prostatic hyperplasia)    Burning with urination 01/10/2016   Constipation    Dysrhythmia    "OCCASSIONAL SPEEDING UP OF HEART RATE" PT THINKING IT MAY BE DUE TO HERNIA   Enlarged prostate    Headache    MIGRAINES   Inguinal hernia    left    Scoliosis     Surgical History: Past Surgical History:  Procedure Laterality Date   COLONOSCOPY WITH PROPOFOL N/A 05/10/2022   Procedure: COLONOSCOPY WITH PROPOFOL;  Surgeon: Toney Reil, MD;  Location: [facility];   Service: Gastroenterology;  Laterality: N/A;   HERNIA REPAIR Left 1998   AND UMBILICAL   INGUINAL HERNIA REPAIR Right 01/11/2016   Procedure: HERNIA REPAIR INGUINAL ADULT;  Surgeon: Kieth Brightly, MD;  Location: [facility];  Service: General;  Laterality: Right;   INSERTION OF MESH  07/02/2022   Procedure: INSERTION OF MESH- UMBILICAL;  Surgeon: Henrene Dodge, MD;  Location: [facility];  Service: General;;   TONSILLECTOMY     childhood   UMBILICAL HERNIA REPAIR     infant   UMBILICAL HERNIA REPAIR N/A 07/02/2022   Procedure: HERNIA REPAIR UMBILICAL ADULT open, recurrent; incarcerated;  Surgeon: Henrene Dodge, MD;  Location: [facility];  Service: General;  Laterality: N/A;   XI ROBOTIC ASSISTED INGUINAL HERNIA REPAIR WITH MESH Bilateral 07/02/2022   Procedure: XI ROBOTIC ASSISTED INGUINAL HERNIA WITH MESH, recurrent;  Surgeon: Henrene Dodge, MD;  Location: [facility];  Service: General;  Laterality: Bilateral;    Home Medications:  Allergies as of 09/03/2023   No Known Allergies      Medication List        Accurate as of September 03, 2023  4:05 PM. If you have any questions, ask your nurse or doctor.          clotrimazole 1 % cream Commonly known as: LOTRIMIN Apply to affected area 2 times daily   lidocaine 5 % Commonly known  as: LIDODERM Place 1 patch onto the skin daily. Remove & Discard patch within 12 hours or as directed by MD   pregabalin 25 MG capsule Commonly known as: LYRICA Take 25 mg twice a day for 2 weeks, then increase to 50 mg twice a day   tadalafil 5 MG tablet Commonly known as: CIALIS TAKE ONE TABLET BY MOUTH DAILY AS NEEDED FOR ERECTILE DYSFUNCTION        Allergies: No Known Allergies  Family History: Family History  Problem Relation Age of Onset   Hypertension Maternal Grandmother    Prostate cancer Neg Hx    Kidney cancer Neg Hx    Bladder Cancer Neg Hx     Social History:  reports that he has been smoking cigars. He has been exposed to  tobacco smoke. He has never used smokeless tobacco. He reports current alcohol use. He reports that he does not use drugs.  ROS: For pertinent review of systems please refer to history of present illness  Physical Exam: BP 128/84   Pulse 99   Constitutional:  Well nourished. Alert and oriented, No acute distress. HEENT: [place] AT, moist mucus membranes.  Trachea midline Cardiovascular: No clubbing, cyanosis, or edema. Respiratory: Normal respiratory effort, no increased work of breathing. Neurologic: Grossly intact, no focal deficits, moving all 4 extremities. Psychiatric: Normal mood and affect.   Laboratory Data: Component     Latest Ref Rng 08/16/2023  Color, UA   Clarity, UA   Glucose     Negative    Bilirubin, UA   Ketones, UA   Specific Gravity, UA     1.010 - 1.025    RBC, UA   pH, UA     5.0 - 8.0    Protein,UA     Negative    Urobilinogen, UA     0.2 or 1.0 E.U./dL   Nitrite, UA   Leukocytes,UA     Negative    Appearance     CLEAR  CLEAR !   Odor   Color, Urine     YELLOW  STRAW !   Specific Gravity, Urine     1.005 - 1.030  1.012   pH     5.0 - 8.0  5.0   Glucose, UA     NEGATIVE mg/dL NEGATIVE   Hgb urine dipstick     NEGATIVE  NEGATIVE   Bilirubin Urine     NEGATIVE  NEGATIVE   Ketones, ur     NEGATIVE mg/dL NEGATIVE   Protein     NEGATIVE mg/dL NEGATIVE   Nitrite     NEGATIVE  NEGATIVE   Leukocytes,Ua     NEGATIVE  NEGATIVE   RBC / HPF     0 - 2 /HPF   WBC, UA     0 - 5 /HPF   Bacteria, UA     NONE SEEN /HPF   Squamous Epithelial / HPF     < OR = 5 /HPF   Mucus   Appearance Ur     Clear    Urobilinogen, Ur     0.2 - 1.0 mg/dL   Microscopic Examination   Hyaline Casts, UA   Hyaline Cast     NONE SEEN /LPF   NOTE:     Legend: ! Abnormal  CMP     Component Value Date/Time   NA 138 08/13/2023 2121   NA 139 07/10/2017 1703   NA 140 09/30/2014 1629   K 3.6 08/13/2023 2121  K 3.8 09/30/2014 1629   CL 108 08/13/2023 2121   CL  107 09/30/2014 1629   CO2 25 08/13/2023 2121   CO2 27 09/30/2014 1629   GLUCOSE 105 (H) 08/13/2023 2121   GLUCOSE 92 09/30/2014 1629   BUN 13 08/13/2023 2121   BUN 18 07/10/2017 1703   BUN 13 09/30/2014 1629   CREATININE 1.03 08/13/2023 2121   CREATININE 1.36 (H) 03/04/2023 1606   CALCIUM 8.8 (L) 08/13/2023 2121   CALCIUM 8.7 09/30/2014 1629   PROT 7.2 08/13/2023 2121   PROT 7.0 07/10/2017 1703   PROT 7.8 09/30/2014 1629   ALBUMIN 4.0 08/13/2023 2121   ALBUMIN 4.2 07/10/2017 1703   ALBUMIN 3.9 09/30/2014 1629   AST 19 08/13/2023 2121   AST 21 09/30/2014 1629   ALT 20 08/13/2023 2121   ALT 24 09/30/2014 1629   ALKPHOS 52 08/13/2023 2121   ALKPHOS 61 09/30/2014 1629   BILITOT 0.7 08/13/2023 2121   BILITOT <0.2 07/10/2017 1703   BILITOT 0.4 09/30/2014 1629   EGFR 63 03/04/2023 1606   GFRNONAA >60 08/13/2023 2121   GFRNONAA 67 02/20/2021 1626    CBC    Component Value Date/Time   WBC 5.3 08/13/2023 2121   RBC 5.43 08/13/2023 2121   HGB 15.5 08/13/2023 2121   HGB 16.5 09/30/2014 1629   HCT 46.2 08/13/2023 2121   HCT 50.1 09/30/2014 1629   PLT 219 08/13/2023 2121   PLT 253 09/30/2014 1629   MCV 85.1 08/13/2023 2121   MCV 86 09/30/2014 1629   MCH 28.5 08/13/2023 2121   MCHC 33.5 08/13/2023 2121   RDW 14.0 08/13/2023 2121   RDW 13.4 09/30/2014 1629   LYMPHSABS 2.1 08/13/2023 2121   LYMPHSABS 2.1 09/30/2014 1629   MONOABS 0.6 08/13/2023 2121   MONOABS 0.8 09/30/2014 1629   EOSABS 0.1 08/13/2023 2121   EOSABS 0.1 09/30/2014 1629   BASOSABS 0.0 08/13/2023 2121   BASOSABS 0.0 09/30/2014 1629   Urinalysis See EPIC and HPI  I have reviewed the labs.   Pertinent Imaging: N/A    Assessment & Plan:    1. BPH with LUTS -PSA pending -continue conservative management, avoiding bladder irritants and timed voiding's -Continue Cialis 5 mg daily -He is very concerned since he is having right lower quadrant discomfort that something may be wrong with his prostate and  therefore we will check a PSA to help ease his anxiety  2. Dysuria -Urinalysis is benign -Sent urine for atypical cultures -The dysuria may also be the result of his anxiety towards prostate, but we will follow-up when results are available -If urine cultures are negative and he continues to experience dysuria, we may need to repeat cystoscopy  Return for pending PSA and culture results .  These notes generated with voice recognition software. I apologize for typographical errors.  Cloretta Ned  [facility] [address] [place], [place] [address] [phone]

## 2023-09-03 ENCOUNTER — Telehealth: Payer: Self-pay | Admitting: Internal Medicine

## 2023-09-03 ENCOUNTER — Encounter: Payer: Self-pay | Admitting: Urology

## 2023-09-03 ENCOUNTER — Ambulatory Visit: Payer: 59 | Admitting: Urology

## 2023-09-03 VITALS — BP 128/84 | HR 99

## 2023-09-03 DIAGNOSIS — R300 Dysuria: Secondary | ICD-10-CM

## 2023-09-03 DIAGNOSIS — N401 Enlarged prostate with lower urinary tract symptoms: Secondary | ICD-10-CM

## 2023-09-03 LAB — URINALYSIS, COMPLETE
Bilirubin, UA: NEGATIVE
Glucose, UA: NEGATIVE
Ketones, UA: NEGATIVE
Leukocytes,UA: NEGATIVE
Nitrite, UA: NEGATIVE
Protein,UA: NEGATIVE
RBC, UA: NEGATIVE
Specific Gravity, UA: 1.015 (ref 1.005–1.030)
Urobilinogen, Ur: 0.2 mg/dL (ref 0.2–1.0)
pH, UA: 5.5 (ref 5.0–7.5)

## 2023-09-03 LAB — MICROSCOPIC EXAMINATION: Bacteria, UA: NONE SEEN

## 2023-09-03 NOTE — Telephone Encounter (Signed)
Copied from CRM [id number]. Topic: General - Other >> Sep 03, 2023  9:36 AM Franchot Heidelberg wrote: Reason for CRM: Pt called to discuss his lab results, please advise with provider notes.  [phone]

## 2023-09-03 NOTE — Telephone Encounter (Signed)
Waiting on all labs

## 2023-09-03 NOTE — Addendum Note (Signed)
Addended by: Margarita Mail on: 09/03/2023 11:35 AM   Modules accepted: Orders

## 2023-09-04 LAB — URINE CYTOLOGY ANCILLARY ONLY
Chlamydia: NEGATIVE
Comment: NEGATIVE
Comment: NORMAL
Neisseria Gonorrhea: NEGATIVE

## 2023-09-04 LAB — B12 AND FOLATE PANEL
Folate: 20.3 ng/mL
Vitamin B-12: 836 pg/mL (ref 200–1100)

## 2023-09-04 LAB — VITAMIN D 25 HYDROXY (VIT D DEFICIENCY, FRACTURES): Vit D, 25-Hydroxy: 31 ng/mL (ref 30–100)

## 2023-09-04 LAB — TEST AUTHORIZATION

## 2023-09-04 LAB — THYROID PROFILE - CHCC
Free Thyroxine Index: 2.3 (ref 1.4–3.8)
T3 Uptake: 25 % (ref 22–35)
T4, Total: 9 ug/dL (ref 4.9–10.5)

## 2023-09-04 LAB — RPR: RPR Ser Ql: NONREACTIVE

## 2023-09-04 LAB — PSA: Prostate Specific Ag, Serum: 0.9 ng/mL (ref 0.0–4.0)

## 2023-09-04 LAB — TSH: TSH: 0.35 m[IU]/L — ABNORMAL LOW (ref 0.40–4.50)

## 2023-09-08 ENCOUNTER — Ambulatory Visit
Admission: RE | Admit: 2023-09-08 | Discharge: 2023-09-08 | Disposition: A | Discharge status: Home / Self Care | Payer: 59 | Source: Ambulatory Visit | Attending: Internal Medicine | Admitting: Internal Medicine

## 2023-09-08 DIAGNOSIS — R1031 Right lower quadrant pain: Secondary | ICD-10-CM | POA: Diagnosis present

## 2023-09-10 LAB — MYCOPLASMA / UREAPLASMA CULTURE
Mycoplasma hominis Culture: NEGATIVE
Ureaplasma urealyticum: NEGATIVE

## 2023-09-15 ENCOUNTER — Encounter: Payer: Self-pay | Admitting: Surgery

## 2023-09-15 ENCOUNTER — Ambulatory Visit (INDEPENDENT_AMBULATORY_CARE_PROVIDER_SITE_OTHER): Payer: 59 | Admitting: Surgery

## 2023-09-15 VITALS — BP 139/81 | HR 94 | Temp 98.8°F | Ht 72.0 in | Wt 192.8 lb

## 2023-09-15 DIAGNOSIS — R1031 Right lower quadrant pain: Secondary | ICD-10-CM

## 2023-09-15 DIAGNOSIS — K4091 Unilateral inguinal hernia, without obstruction or gangrene, recurrent: Secondary | ICD-10-CM

## 2023-09-15 DIAGNOSIS — R1032 Left lower quadrant pain: Secondary | ICD-10-CM | POA: Diagnosis not present

## 2023-09-15 DIAGNOSIS — G8929 Other chronic pain: Secondary | ICD-10-CM

## 2023-09-15 NOTE — Progress Notes (Signed)
09/15/2023  History of Present Illness: Joseph Strong is a 52 y.o. male presenting for evaluation of right groin pain.  The patient has had multiple hernia surgeries in the past.  He had an open left and umbilical hernia repair in 1998, an open right inguinal hernia repair in 2017, and a robotic bilateral inguinal hernia repair and open umbilical hernia repair in 2023.  Patient has had issues with pain and numbness in the left groin and left thigh since his last surgery and is following with Neurology for this.  The patient reports that about 2 weeks ago he started having some discomfort in the right groin extending laterally towards the ASIS.  This started one day when he was getting up, but denies any heavy/strenuous activity.  Reports sometimes discomfort at the umbilical area, but denies any constant pain or bulging sensation.  He went to Urgent Care on 09/01/23 and saw his PCP on 09/02/23.  A right groin venous ultrasound was obtained and showed no DVT in the right lower extremity.  This did not assess for a hernia.   Past Medical History: Past Medical History:  Diagnosis Date   BPH (benign prostatic hyperplasia)    Burning with urination 01/10/2016   Constipation    Dysrhythmia    "OCCASSIONAL SPEEDING UP OF HEART RATE" PT THINKING IT MAY BE DUE TO HERNIA   Enlarged prostate    Headache    MIGRAINES   Inguinal hernia    left    Scoliosis      Past Surgical History: Past Surgical History:  Procedure Laterality Date   COLONOSCOPY WITH PROPOFOL N/A 05/10/2022   Procedure: COLONOSCOPY WITH PROPOFOL;  Surgeon: Toney Reil, MD;  Location: [facility];  Service: Gastroenterology;  Laterality: N/A;   HERNIA REPAIR Left 1998   AND UMBILICAL   INGUINAL HERNIA REPAIR Right 01/11/2016   Procedure: HERNIA REPAIR INGUINAL ADULT;  Surgeon: Kieth Brightly, MD;  Location: [facility];  Service: General;  Laterality: Right;   INSERTION OF MESH  07/02/2022   Procedure: INSERTION OF  MESH- UMBILICAL;  Surgeon: Henrene Dodge, MD;  Location: [facility];  Service: General;;   TONSILLECTOMY     childhood   UMBILICAL HERNIA REPAIR     infant   UMBILICAL HERNIA REPAIR N/A 07/02/2022   Procedure: HERNIA REPAIR UMBILICAL ADULT open, recurrent; incarcerated;  Surgeon: Henrene Dodge, MD;  Location: [facility];  Service: General;  Laterality: N/A;   XI ROBOTIC ASSISTED INGUINAL HERNIA REPAIR WITH MESH Bilateral 07/02/2022   Procedure: XI ROBOTIC ASSISTED INGUINAL HERNIA WITH MESH, recurrent;  Surgeon: Henrene Dodge, MD;  Location: [facility];  Service: General;  Laterality: Bilateral;    Home Medications: Prior to Admission medications   Medication Sig Start Date End Date Taking? Authorizing Provider  clotrimazole (LOTRIMIN) 1 % cream Apply to affected area 2 times daily 09/01/23  Yes Eusebio Friendly B, PA-C  lidocaine (LIDODERM) 5 % Place 1 patch onto the skin daily. Remove & Discard patch within 12 hours or as directed by MD 08/16/23  Yes Irean Hong, MD  pregabalin (LYRICA) 25 MG capsule Take 25 mg twice a day for 2 weeks, then increase to 50 mg twice a day 12/24/22  Yes [provider]  tadalafil (CIALIS) 5 MG tablet TAKE ONE TABLET BY MOUTH DAILY AS NEEDED FOR ERECTILE DYSFUNCTION 12/26/22  Yes McGowan, Carollee Herter A, PA-C    Allergies: No Known Allergies  Review of Systems: Review of Systems  Constitutional:  Negative for chills  and fever.  Respiratory:  Negative for shortness of breath.   Cardiovascular:  Negative for chest pain.  Gastrointestinal:  Positive for abdominal pain. Negative for nausea and vomiting.    Physical Exam BP 139/81 (BP Location: Right Arm, Patient Position: Sitting, Cuff Size: Large)   Pulse 94   Temp 98.8 F (37.1 C) (Oral)   Ht 6' (1.829 m)   Wt 192 lb 12.8 oz (87.5 kg)   SpO2 98%   BMI 26.15 kg/m  CONSTITUTIONAL: No acute distress HEENT:  Normocephalic, atraumatic, extraocular motion intact. RESPIRATORY:  Normal respiratory effort without  pathologic use of accessory muscles. CARDIOVASCULAR: Regular rhythm and rate. GI: The abdomen is soft, non-distended, with some discomfort at the umbilicus, left groin, and right groin.  At the umbilicus, the incisions are well healed, without any evidence of hernia recurrence.  In the left groin, there is no evidence of hernia recurrence with coughing or straining.  In the right groin, there may be a fullness sensation when the patient coughs at the crease of the groin, but cannot fully discern a true bulging or hernia.  NEUROLOGIC:  Motor and sensation is grossly normal.  Cranial nerves are grossly intact. PSYCH:  Alert and oriented to person, place and time. Affect is normal.  Labs/Imaging: Left lower extremity venous U/S on 09/08/23: FINDINGS: VENOUS Normal compressibility of the common femoral, superficial femoral, and popliteal veins, as well as the visualized calf veins. Visualized portions of profunda femoral vein and great saphenous vein unremarkable. No filling defects to suggest DVT on grayscale or color Doppler imaging. Doppler waveforms show normal direction of venous flow, normal respiratory plasticity and response to augmentation.   Limited views of the contralateral common femoral vein are unremarkable.   OTHER None.   Limitations: none   IMPRESSION: Negative.  Assessment and Plan: This is a 52 y.o. male with right groin pain, and chronic left groin/thigh pain.  --Discussed with the patient that on exam today, there is no evidence of recurrence at the umbilicus or left groin, and is unclear if there is any hernia in the right groin.  As a precaution, will order a soft tissue ultrasound of the right groin to further evaluate.  The patient is asking about the left side too since he's still having pain.  Will order also a left groin soft tissue ultrasound.   --I will call the patient with the results. If there is a hernia on ultrasound, discussed with the patient that would  be prudent to refer him to a tertiary center and he's had surgery both open and minimally invasive, and further surgery would put him at higher risk of further nerve issues or scar issues, or possibly injury.  --In the meantime, discussed to take it easy with activity level.  I spent 20 minutes dedicated to the care of this patient on the date of this encounter to include pre-visit review of records, face-to-face time with the patient discussing diagnosis and management, and any post-visit coordination of care.   [name], MD [facility]

## 2023-09-15 NOTE — Patient Instructions (Addendum)
Your Ultrasound is scheduled for 09/22/2023 4 pm (arrive at 3:45 pm) at [facility].   If you have any concerns or questions, please feel free to call our office. We will call you with the results.    Inguinal Hernia, Adult An inguinal hernia is when fat or your intestines push through a weak spot in a muscle where your leg meets your lower belly (groin). This causes a bulge. This kind of hernia could also be: In your scrotum, if you are male. In folds of skin around your vagina, if you are male. There are three types of inguinal hernias: Hernias that can be pushed back into the belly (are reducible). This type rarely causes pain. Hernias that cannot be pushed back into the belly (are incarcerated). Hernias that cannot be pushed back into the belly and lose their blood supply (are strangulated). This type needs emergency surgery. What are the causes? This condition is caused by having a weak spot in the muscles or tissues in your groin. This develops over time. The hernia may poke through the weak spot when you strain your lower belly muscles all of a sudden, such as when you: Lift a heavy object. Strain to poop (have a bowel movement). Trouble pooping (constipation) can lead to straining. Cough. What increases the risk? This condition is more likely to develop in: Males. Pregnant females. People who: Are overweight. Work in jobs that require long periods of standing or heavy lifting. Have had an inguinal hernia before. Smoke or have lung disease. These factors can lead to long-term (chronic) coughing. What are the signs or symptoms? Symptoms may depend on the size of the hernia. Often, a small hernia has no symptoms. Symptoms of a larger hernia may include: A bulge in the groin area. This is easier to see when standing. You might not be able to see it when you are lying down. Pain or burning in the groin. This may get worse when you lift, strain, or cough. A dull ache or a  feeling of pressure in the groin. An abnormal bulge in the scrotum, in males. Symptoms of a strangulated inguinal hernia may include: A bulge in your groin that is very painful and tender to the touch. A bulge that turns red or purple. Fever, feeling like you may vomit (nausea), and vomiting. Not being able to poop or to pass gas. How is this treated? Treatment depends on the size of your hernia and whether you have symptoms. If you do not have symptoms, your doctor may have you watch your hernia carefully and have you come in for follow-up visits. If your hernia is large or if you have symptoms, you may need surgery to repair the hernia. Follow these instructions at home: Lifestyle Avoid lifting heavy objects. Avoid standing for long amounts of time. Do not smoke or use any products that contain nicotine or tobacco. If you need help quitting, ask your doctor. Stay at a healthy weight. Prevent trouble pooping You may need to take these actions to prevent or treat trouble pooping: Drink enough fluid to keep your pee (urine) pale yellow. Take over-the-counter or prescription medicines. Eat foods that are high in fiber. These include beans, whole grains, and fresh fruits and vegetables. Limit foods that are high in fat and sugar. These include fried or sweet foods. General instructions You may try to push your hernia back in place by very gently pressing on it when you are lying down. Do not try to push  the bulge back in if it will not go in easily. Watch your hernia for any changes in shape, size, or color. Tell your doctor if you see any changes. Take over-the-counter and prescription medicines only as told by your doctor. Keep all follow-up visits. Contact a doctor if: You have a fever or chills. You have new symptoms. Your symptoms get worse. Get help right away if: You have pain in your groin that gets worse all of a sudden. You have a bulge in your groin that: Gets bigger all of a  sudden, and it does not get smaller after that. Turns red or purple. Is painful when you touch it. You are a male, and you have: Sudden pain in your scrotum. A sudden change in the size of your scrotum. You cannot push the hernia back in place by very gently pressing on it when you are lying down. You feel like you may vomit, and that feeling does not go away. You keep vomiting. You have a fast heartbeat. You cannot poop or pass gas. These symptoms may be an emergency. Get help right away. Call your local emergency services (911 in the [place] Do not wait to see if the symptoms will go away. Do not drive yourself to the hospital. Summary An inguinal hernia is when fat or your intestines push through a weak spot in a muscle where your leg meets your lower belly (groin). This causes a bulge. If you do not have symptoms, you may not need treatment. If you have symptoms or a large hernia, you may need surgery. Avoid lifting heavy objects. Also, avoid standing for long amounts of time. Do not try to push the bulge back in if it will not go in easily. This information is not intended to replace advice given to you by your health care provider. Make sure you discuss any questions you have with your health care provider. Document Revised: 07/11/2020 Document Reviewed: 07/11/2020 Elsevier Patient Education  2024 [employer].

## 2023-09-16 ENCOUNTER — Encounter: Payer: Self-pay | Admitting: Neurology

## 2023-09-19 ENCOUNTER — Ambulatory Visit
Admission: RE | Admit: 2023-09-19 | Discharge: 2023-09-19 | Disposition: A | Discharge status: Home / Self Care | Payer: 59 | Source: Ambulatory Visit | Attending: Neurology | Admitting: Neurology

## 2023-09-19 DIAGNOSIS — H532 Diplopia: Secondary | ICD-10-CM

## 2023-09-19 DIAGNOSIS — R42 Dizziness and giddiness: Secondary | ICD-10-CM

## 2023-09-22 ENCOUNTER — Ambulatory Visit
Admission: RE | Admit: 2023-09-22 | Discharge: 2023-09-22 | Disposition: A | Discharge status: Home / Self Care | Payer: 59 | Source: Ambulatory Visit | Attending: Surgery | Admitting: Surgery

## 2023-09-22 DIAGNOSIS — K4091 Unilateral inguinal hernia, without obstruction or gangrene, recurrent: Secondary | ICD-10-CM | POA: Insufficient documentation

## 2023-10-01 ENCOUNTER — Telehealth: Payer: Self-pay

## 2023-10-01 ENCOUNTER — Telehealth: Payer: Self-pay | Admitting: Surgery

## 2023-10-01 ENCOUNTER — Other Ambulatory Visit: Payer: Self-pay

## 2023-10-01 DIAGNOSIS — K4091 Unilateral inguinal hernia, without obstruction or gangrene, recurrent: Secondary | ICD-10-CM

## 2023-10-01 NOTE — Telephone Encounter (Signed)
Call to patient regarding results. Unable to leave a voicemail as mailbox is full.

## 2023-10-01 NOTE — Telephone Encounter (Signed)
Call to patient to see about scheduling CT scan. Unable to leave a message.

## 2023-10-01 NOTE — Telephone Encounter (Signed)
-----   Message from Norina Buzzard sent at 09/30/2023  3:18 PM EST ----- Regarding: FW: U/S results  ----- Message ----- From: Henrene Dodge, MD Sent: 09/30/2023   3:01 PM EST To: [facility] Subject: U/S results                                    Hi,  I saw this patient on 10/21 for possible right groin pain.  He's had prior open right and left inguinal hernia repair in the past, and I did robotic bilateral inguinal hernia repair on 07/02/22.  He had ultrasound of both groin areas on 09/22/23 which were just read.  There may be some fat in the groin area on both sides, but does not seem too conclusive for hernia recurrences.  I think for now we can just watch and wait.  Given his multiple surgeries, going back would be more difficult, so I think it's better to watch for now.  Can y'all contact him with the results/plan please?  Thanks!  [employer]

## 2023-10-01 NOTE — Telephone Encounter (Signed)
Spoke with the patient and let him know what Dr Aleen Campi said. He would like to see about having a CT scan done of the areas for a better look at what is going on as he is having more symptoms. Message sent to Dr Aleen Campi about this.

## 2023-10-01 NOTE — Telephone Encounter (Signed)
Patient called back, stating that he wants to go ahead and schedule the CT scan.  He was not able to get to his phone earlier when called due to work.  His telephone number is verified with him and correct number in chart.  Please call him when scheduled. Thank you.

## 2023-10-02 NOTE — Telephone Encounter (Signed)
The patient is scheduled for a CT scan of the Pelvis on 10/10/23 at [facility]. He will need to arrive at 2:15 pm.

## 2023-10-06 ENCOUNTER — Ambulatory Visit (INDEPENDENT_AMBULATORY_CARE_PROVIDER_SITE_OTHER): Payer: 59

## 2023-10-06 ENCOUNTER — Encounter: Payer: Self-pay | Admitting: Podiatry

## 2023-10-06 ENCOUNTER — Ambulatory Visit: Payer: 59 | Admitting: Podiatry

## 2023-10-06 DIAGNOSIS — B353 Tinea pedis: Secondary | ICD-10-CM

## 2023-10-06 DIAGNOSIS — M2011 Hallux valgus (acquired), right foot: Secondary | ICD-10-CM | POA: Diagnosis not present

## 2023-10-06 DIAGNOSIS — M2012 Hallux valgus (acquired), left foot: Secondary | ICD-10-CM

## 2023-10-06 DIAGNOSIS — L84 Corns and callosities: Secondary | ICD-10-CM

## 2023-10-06 DIAGNOSIS — M21611 Bunion of right foot: Secondary | ICD-10-CM

## 2023-10-06 DIAGNOSIS — L608 Other nail disorders: Secondary | ICD-10-CM

## 2023-10-06 MED ORDER — KETOCONAZOLE 2 % EX CREA: 1.0000 | TOPICAL_CREAM | Freq: Every day | CUTANEOUS | 2 refills | Status: AC

## 2023-10-06 NOTE — Patient Instructions (Signed)
VISIT SUMMARY:  During today's visit, we addressed your foot pain, itching feet, and concerns about your toenails. We discussed your bunion, athlete's foot, and calluses, and provided treatment plans for each. We also reassured you about the pigmentation in your toenails.  YOUR PLAN:  -HALLUX VALGUS (BUNION): Hallux Valgus, commonly known as a bunion, is a bony bump that forms on the joint at the base of your big toe. It can cause pain and calluses due to toe drifting. We discussed both surgical and non-surgical options. For now, use a bunion pad and spacer to help alleviate the pain. We have scheduled bunion surgery for January 2025.  -ATHLETE'S FOOT (TINEA PEDIS): Athlete's foot is a fungal infection that causes itching and burning between the toes. We prescribed Ketoconazole cream to be applied twice daily and advised you to spray athlete's foot spray inside your work boots at the end of each day.  -CALLUSES: Calluses are thickened and hardened areas of skin that can become painful. We recommended using Urea 40% cream daily to soften the calluses.  -PIGMENTATION IN TOENAILS: The dark streaks in your toenails are likely due to pigmentation of the skin and are not a cause for concern. However, monitor for any spreading into the skin and report if observed.  INSTRUCTIONS:  We will follow up after your bunion surgery in January 2025.

## 2023-10-06 NOTE — Progress Notes (Signed)
Subjective:  Patient ID: Joseph Strong, male    DOB: [birth year],  MRN: [id number]  Chief Complaint  Patient presents with   Foot Pain    "I have a bunion on my right foot and both my feet itch and burn."    Discussed the use of AI scribe software for clinical note transcription with the patient, who gave verbal consent to proceed.  History of Present Illness   The patient presents with foot pain, specifically in the arch and bunion area, and itching feet. He describes the pain as a burning sensation and has been self-treating with foot spray. He also has calluses on his feet. 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 He also mentions darkening of the toenails, which he was concerned might be a fungus, but the doctor reassured him it was just pigmentation. The patient has a history of a large prostate since the age of 68 and has undergone six hernia surgeries, which have resulted in complications from the mesh implants. He also smokes occasionally.          Objective:    Physical Exam   MUSCULOSKELETAL: Bunion on right foot with pain over medial eminence, no hypermobility. Calluses on plantar aspect of right foot, medial aspect of first MTP joint. Interdigital area with dry scale and rash. Smaller bunion on left foot. SKIN: Toenails exhibit darkening, consistent with pigmentation.       No images are attached to the encounter.    Results   RADIOLOGY Foot X-ray: Hallux valgus deformity with pain over the medial eminence. No hypermobility. Callus on the plantar medial hallux and plantar medial first MTP joint. Interdigital mass area with xerosis and rash.      Assessment:   1. Hav (hallux abducto valgus), left   2. Hallux valgus with bunions, right   3. Tinea pedis of both feet      Plan:  Patient was evaluated and treated and all questions answered.  Assessment and Plan    Hallux Valgus (Bunion)   Discussed the etiology and  treatment including surgical and non surgical treatment for painful bunions.. He  has exhausted all non surgical treatment prior to this visit including shoe gear changes and padding. He desires surgical intervention. We discussed all risks including but not limited to: pain, swelling, infection, scar, numbness which may be temporary or permanent, chronic pain, stiffness, nerve pain or damage, wound healing problems, bone healing problems including delayed or non-union and recurrence. Specifically we discussed the following procedures: bunionectomy of the right foot with double osteotomy. Informed consent was signed today. Surgery will be scheduled at a mutually agreeable date. Information regarding this will be forwarded to our surgery scheduler. In the interim until surgery I recommended utilizing as wide of shoes as possible, take NSAIDs or tylenol as tolerated for pain, and a bunion padding shield which can be purchased online.   Athlete's Foot (Tinea Pedis)   He presents with itching and burning between the toes, indicative of athlete's foot. We prescribed Ketoconazole cream to be applied twice daily and advised spraying athlete's foot spray inside work boots at the end of the day.  Calluses   He has painful calluses on his feet. We recommended Urea 40% cream to soften the calluses, to be applied daily.  Pigmentation in Toenails   He has dark streaks in his toenails, likely due to pigmentation of the skin. We advised monitoring for any spreading into the skin and  to report if observed.  Follow-up   We will follow up after the bunion surgery in January 2025.        Surgical plan:  Procedure: -Right foot double osteotomy bunionectomy  Location: -[facility]  Anesthesia plan: -sedation with regional blcok  Postoperative pain plan: - Tylenol 1000 mg every 6 hours, ibuprofen 600 mg every 6 hours, gabapentin 300 mg every 8 hours x5 days, oxycodone 5 mg 1-2 tabs every 6 hours only as  needed  DVT prophylaxis: -none required  WB Restrictions / DME needs: -partial WB to heel only in CAM boot    No follow-ups on file.

## 2023-10-06 NOTE — Telephone Encounter (Signed)
Multiple attempts have been made to contact the patient regarding his CT scan. Unable to leave a message due to no voicemail.

## 2023-10-08 ENCOUNTER — Telehealth: Payer: Self-pay

## 2023-10-08 NOTE — Telephone Encounter (Signed)
Received surgery paperwork from the [place] office. Tried calling but the call went straight to voicemail and the voicemail has not been set up.

## 2023-10-10 ENCOUNTER — Ambulatory Visit: Admission: RE | Admit: 2023-10-10 | Payer: 59 | Source: Ambulatory Visit

## 2023-10-10 ENCOUNTER — Ambulatory Visit: Payer: 59

## 2023-10-16 ENCOUNTER — Telehealth: Payer: Self-pay

## 2023-10-16 NOTE — Telephone Encounter (Signed)
Received a voicemail from [place] about setting up surgery with Dr. Lilian Kapur. I tried calling back, the call went straight to voicemail. Couldn't leave a message because voicemail has not been set up.

## 2023-10-17 ENCOUNTER — Ambulatory Visit: Payer: 59

## 2023-10-20 ENCOUNTER — Telehealth: Payer: Self-pay | Admitting: Internal Medicine

## 2023-10-20 NOTE — Telephone Encounter (Signed)
The patient states he previously spoke to his provider about Vitamin D and possible vitamin deficiency. He would like a new script for Vitamin D sent to his pharmacy at    [facility] [id number] - [place], [place] - [address] Phone: [phone]  Fax: [phone]     Please assist patient further

## 2023-10-21 NOTE — Telephone Encounter (Signed)
Tried to call and unable to leave VM due to not being setup.  No Rx needed needs to get [facility] vitamin D 1000 units

## 2023-10-22 ENCOUNTER — Ambulatory Visit: Admission: RE | Admit: 2023-10-22 | Payer: 59 | Source: Ambulatory Visit

## 2023-12-18 ENCOUNTER — Other Ambulatory Visit: Payer: Self-pay | Admitting: Urology

## 2023-12-31 ENCOUNTER — Telehealth: Payer: Self-pay | Admitting: *Deleted

## 2023-12-31 NOTE — Telephone Encounter (Signed)
 Patient called and wanted to see if he can get his CT pelvis changed to a CT pelvis/abdomen. He stated that he is having some abdominal discomfort on the side of his hernia. Please call and advise

## 2024-01-06 ENCOUNTER — Ambulatory Visit
Admission: RE | Admit: 2024-01-06 | Discharge: 2024-01-06 | Disposition: A | Discharge status: Home / Self Care | Payer: 59 | Source: Ambulatory Visit | Attending: Surgery | Admitting: Surgery

## 2024-01-06 DIAGNOSIS — K4091 Unilateral inguinal hernia, without obstruction or gangrene, recurrent: Secondary | ICD-10-CM | POA: Insufficient documentation

## 2024-01-06 MED ORDER — IOHEXOL 300 MG/ML  SOLN
100.0000 mL | Freq: Once | INTRAMUSCULAR | Status: AC | PRN
  Administered 2024-01-06: 100 mL via INTRAVENOUS

## 2024-01-08 ENCOUNTER — Other Ambulatory Visit: Payer: 59

## 2024-01-08 ENCOUNTER — Other Ambulatory Visit: Payer: Self-pay

## 2024-01-08 DIAGNOSIS — Z125 Encounter for screening for malignant neoplasm of prostate: Secondary | ICD-10-CM

## 2024-01-08 DIAGNOSIS — N401 Enlarged prostate with lower urinary tract symptoms: Secondary | ICD-10-CM

## 2024-01-09 ENCOUNTER — Ambulatory Visit: Payer: 59 | Admitting: Internal Medicine

## 2024-01-09 LAB — PSA: Prostate Specific Ag, Serum: 0.7 ng/mL (ref 0.0–4.0)

## 2024-01-12 NOTE — Progress Notes (Unsigned)
12/16/2018  3:54 PM   Joseph Strong [birth year] [id number]  Referring provider: Margarita Mail, DO [address] [place],  [place] [address]  Urological history: 1. BPH with LU TS -PSA (12/2023) 0.7 - cysto (2018) NED - tadalafil 5 mg daily  2. Chronic pelvic pain/dysuria - scrotal US x 2 - NED - CT scans x 2 - NED - MRI NED - colonoscopy (04/2022) - NED  3. ED -Contributing factors of age, BPH, smoking - tadalafil 5 mg daily  4. Prostate cancer screening -PSA (12/2023) 0.7 -baseline PSA (2017) 0.7 at age 59 (slightly > median for age group) -African ancestry -No family history of prostate cancer, breast cancer or ovarian cancer   HPI: Joseph Strong is a 53 y.o. male who presents for follow up.   Previous records reviewed.   I PSS 7/0   He is having daytime frequency and nighttime frequency.  He also continues to have some dysuria.  Patient denies any modifying or aggravating factors.  Patient denies any recent UTI's, gross hematuria or suprapubic/flank pain.  Patient denies any fevers, chills, nausea or vomiting.   Last visit cultures were negative.    IPSS     Row Name 01/13/24 1500         International Prostate Symptom Score   How often have you had the sensation of not emptying your bladder? Less than 1 in 5     How often have you had to urinate less than every two hours? Less than half the time     How often have you found you stopped and started again several times when you urinated? Less than 1 in 5 times     How often have you found it difficult to postpone urination? Not at All     How often have you had a weak urinary stream? Less than 1 in 5 times     How often have you had to strain to start urination? Not at All     How many times did you typically get up at night to urinate? 2 Times     Total IPSS Score 7       Quality of Life due to urinary symptoms   If you were to spend the rest of your life with your urinary condition just  the way it is now how would you feel about that? Delighted              Score:  1-7 Mild 8-19 Moderate 20-35 Severe    SHIM 25  He is taking tadalafil 5 mg daily.  Patient still having spontaneous erections.  He denies any pain or curvature with erections.     SHIM     Row Name 01/13/24 1533         SHIM: Over the last 6 months:   How do you rate your confidence that you could get and keep an erection? Very High     When you had erections with sexual stimulation, how often were your erections hard enough for penetration (entering your partner)? Almost Always or Always     During sexual intercourse, how often were you able to maintain your erection after you had penetrated (entered) your partner? Almost Always or Always     During sexual intercourse, how difficult was it to maintain your erection to completion of intercourse? Not Difficult     When you attempted sexual intercourse, how often was it satisfactory for you? Almost Always or Always  SHIM Total Score   SHIM 25              Score: 1-7 Severe ED 8-11 Moderate ED 12-16 Mild-Moderate ED 17-21 Mild ED 22-25 No ED   PMH: Past Medical History:  Diagnosis Date   BPH (benign prostatic hyperplasia)    Burning with urination 01/10/2016   Constipation    Dysrhythmia    "OCCASSIONAL SPEEDING UP OF HEART RATE" PT THINKING IT MAY BE DUE TO HERNIA   Enlarged prostate    Headache    MIGRAINES   Inguinal hernia    left    Scoliosis     Surgical History: Past Surgical History:  Procedure Laterality Date   COLONOSCOPY WITH PROPOFOL N/A 05/10/2022   Procedure: COLONOSCOPY WITH PROPOFOL;  Surgeon: Toney Reil, MD;  Location: [facility] ENDOSCOPY;  Service: Gastroenterology;  Laterality: N/A;   HERNIA REPAIR Left 1998   AND UMBILICAL   INGUINAL HERNIA REPAIR Right 01/11/2016   Procedure: HERNIA REPAIR INGUINAL ADULT;  Surgeon: Kieth Brightly, MD;  Location: [facility] ORS;  Service: General;   Laterality: Right;   INSERTION OF MESH  07/02/2022   Procedure: INSERTION OF MESH- UMBILICAL;  Surgeon: Henrene Dodge, MD;  Location: [facility] ORS;  Service: General;;   TONSILLECTOMY     childhood   UMBILICAL HERNIA REPAIR     infant   UMBILICAL HERNIA REPAIR N/A 07/02/2022   Procedure: HERNIA REPAIR UMBILICAL ADULT open, recurrent; incarcerated;  Surgeon: Henrene Dodge, MD;  Location: [facility] ORS;  Service: General;  Laterality: N/A;   XI ROBOTIC ASSISTED INGUINAL HERNIA REPAIR WITH MESH Bilateral 07/02/2022   Procedure: XI ROBOTIC ASSISTED INGUINAL HERNIA WITH MESH, recurrent;  Surgeon: Henrene Dodge, MD;  Location: [facility] ORS;  Service: General;  Laterality: Bilateral;    Home Medications:  Allergies as of 01/13/2024   No Known Allergies      Medication List        Accurate as of January 13, 2024  3:54 PM. If you have any questions, ask your nurse or doctor.          STOP taking these medications    clotrimazole 1 % cream Commonly known as: LOTRIMIN Stopped by: Jewelene Mairena   lidocaine 5 % Commonly known as: LIDODERM Stopped by: Braylie Badami   pregabalin 25 MG capsule Commonly known as: LYRICA Stopped by: Iyona Pehrson       TAKE these medications    aspirin EC 81 MG tablet Take 81 mg by mouth daily.   clonazePAM 0.5 MG tablet Commonly known as: KLONOPIN Take 0.5 mg by mouth.   DULoxetine 20 MG capsule Commonly known as: CYMBALTA Take 20 mg daily for two weeks then increase to 20 mg twice daily   ketoconazole 2 % cream Commonly known as: NIZORAL Apply 1 Application topically daily.   solifenacin 10 MG tablet Commonly known as: VESICARE Take 1 tablet (10 mg total) by mouth daily. Started by: Michiel Cowboy   tadalafil 5 MG tablet Commonly known as: CIALIS TAKE 1 TABLET BY MOUTH DAILY AS NEEDED FOR ERECTILE DYSFUNCTION        Allergies: No Known Allergies  Family History: Family History  Problem Relation Age of Onset   Hypertension Maternal  Grandmother    Prostate cancer Neg Hx    Kidney cancer Neg Hx    Bladder Cancer Neg Hx     Social History:  reports that he has been smoking cigars. He has been exposed to tobacco smoke. He has never  used smokeless tobacco. He reports current alcohol use. He reports that he does not use drugs.  ROS: For pertinent review of systems please refer to history of present illness  Physical Exam: BP (!) 142/83   Pulse 80   Ht 6' (1.829 m)   Wt 190 lb (86.2 kg)   BMI 25.77 kg/m   Constitutional:  Well nourished. Alert and oriented, No acute distress. HEENT: [place] AT, moist mucus membranes.  Trachea midline, no masses. Cardiovascular: No clubbing, cyanosis, or edema. Respiratory: Normal respiratory effort, no increased work of breathing. Neurologic: Grossly intact, no focal deficits, moving all 4 extremities. Psychiatric: Normal mood and affect.   Laboratory Data: Component     Latest Ref Rng 01/08/2024  Prostate Specific Ag, Serum     0.0 - 4.0 ng/mL 0.7   I have reviewed the labs.   Pertinent Imaging: N/A    Assessment & Plan:    1. BPH with LUTS -PSA stable  -continue conservative management, avoiding bladder irritants and timed voiding's -Continue Cialis 5 mg daily  2. Erectile dysfunction -Continue tadalafil 5 mg daily  3. OAB -will have a trial of Vesicare 10 mg daily  -if he continues to have dysuria after a trial of OAB meds, will need to repeat cysto  Return in about 6 weeks (around 02/24/2024) for IPSS and PVR.  These notes generated with voice recognition software. I apologize for typographical errors.  Cloretta Ned  [facility] [address] [place], [place] [address] [phone]

## 2024-01-13 ENCOUNTER — Ambulatory Visit (INDEPENDENT_AMBULATORY_CARE_PROVIDER_SITE_OTHER): Payer: 59 | Admitting: Urology

## 2024-01-13 ENCOUNTER — Encounter: Payer: Self-pay | Admitting: Urology

## 2024-01-13 VITALS — BP 142/83 | HR 80 | Ht 72.0 in | Wt 190.0 lb

## 2024-01-13 DIAGNOSIS — N401 Enlarged prostate with lower urinary tract symptoms: Secondary | ICD-10-CM

## 2024-01-13 DIAGNOSIS — N529 Male erectile dysfunction, unspecified: Secondary | ICD-10-CM

## 2024-01-13 DIAGNOSIS — N3281 Overactive bladder: Secondary | ICD-10-CM | POA: Diagnosis not present

## 2024-01-13 MED ORDER — SOLIFENACIN SUCCINATE 10 MG PO TABS: 10.0000 mg | ORAL_TABLET | Freq: Every day | ORAL | 3 refills | Status: DC

## 2024-01-15 ENCOUNTER — Telehealth: Payer: Self-pay | Admitting: Surgery

## 2024-01-15 NOTE — Telephone Encounter (Signed)
Patient had CT scan done just recently, awaiting results.  Please call him.  Thank you.

## 2024-02-02 ENCOUNTER — Ambulatory Visit: Payer: 59 | Admitting: Internal Medicine

## 2024-02-02 ENCOUNTER — Other Ambulatory Visit: Payer: Self-pay

## 2024-02-02 ENCOUNTER — Encounter: Payer: Self-pay | Admitting: Internal Medicine

## 2024-02-02 VITALS — BP 118/82 | HR 96 | Resp 18 | Ht 72.0 in | Wt 196.0 lb

## 2024-02-02 DIAGNOSIS — K219 Gastro-esophageal reflux disease without esophagitis: Secondary | ICD-10-CM

## 2024-02-02 MED ORDER — OMEPRAZOLE 20 MG PO CPDR: 20.0000 mg | DELAYED_RELEASE_CAPSULE | Freq: Every day | ORAL | 1 refills | Status: AC | PRN

## 2024-02-02 MED ORDER — OMEPRAZOLE 20 MG PO CPDR: 20.0000 mg | DELAYED_RELEASE_CAPSULE | Freq: Every day | ORAL | 1 refills | Status: DC

## 2024-02-02 NOTE — Progress Notes (Signed)
 Established Patient Office Visit  Subjective   Patient ID: Joseph Strong, male    DOB: [birth year]  Age: 53 y.o. MRN: [id number]  Chief Complaint  Patient presents with   Gastroesophageal Reflux    And have labs done    Gastroesophageal Reflux He complains of abdominal pain and heartburn. He reports no nausea.    Patient is here for general check up and acid reflux symptoms. He does have a physical scheduled next month and will wait to get labs done then. He is having GERD symptoms which is new for him. Uncertain which foods trigger symptoms but will have epigastric tenderness and a burning sensation up his throat. He has not taken any medications for this.   Patient Active Problem List   Diagnosis Date Noted   Lumbar radiculopathy 09/05/2022   Recurrent umbilical hernia with incarceration    Bilateral recurrent inguinal hernia without obstruction or gangrene    Chronic pain syndrome 03/04/2021   Encounter for screening colonoscopy 03/04/2021   Disorder of skeletal system 03/04/2021   Problems influencing health status 03/04/2021   Pancreas divisum 10/09/2018   Constipation 10/09/2018   Ventral hernia without obstruction or gangrene 10/09/2018   Prostate cancer screening 09/16/2017   Abdominal pain, periumbilical 12/20/2016   Decreased frequency of bowel movements 01/10/2016   Paraspinal back pain (Left) 12/08/2015   Dizziness, nonspecific 10/31/2015   Abnormal TSH 10/31/2015   Chronic groin pain (Left) 10/31/2015   Abnormal EKG 10/31/2015   Testicular pain (Left) 09/20/2015   S/P unilateral inguinal hernia repair 09/20/2015   Past Medical History:  Diagnosis Date   BPH (benign prostatic hyperplasia)    Burning with urination 01/10/2016   Constipation    Dysrhythmia    "OCCASSIONAL SPEEDING UP OF HEART RATE" PT THINKING IT MAY BE DUE TO HERNIA   Enlarged prostate    Headache    MIGRAINES   Inguinal hernia    left    Scoliosis    Past Surgical History:  Procedure  Laterality Date   COLONOSCOPY WITH PROPOFOL N/A 05/10/2022   Procedure: COLONOSCOPY WITH PROPOFOL;  Surgeon: Toney Reil, MD;  Location: [facility];  Service: Gastroenterology;  Laterality: N/A;   HERNIA REPAIR Left 1998   AND UMBILICAL   INGUINAL HERNIA REPAIR Right 01/11/2016   Procedure: HERNIA REPAIR INGUINAL ADULT;  Surgeon: Kieth Brightly, MD;  Location: [facility] ORS;  Service: General;  Laterality: Right;   INSERTION OF MESH  07/02/2022   Procedure: INSERTION OF MESH- UMBILICAL;  Surgeon: Henrene Dodge, MD;  Location: [facility] OR[facility];  Service: General;;   TONSILLECTOMY     childhood   UMBILICAL HERNIA REPAIR     infant   UMBILICAL HERNIA REPAIR N/A 07/02/2022   Procedure: HERNIA REPAIR UMBILICAL ADULT open, recurrent; incarcerated;  Surgeon: Henrene Dodge, MD;  Location: [facility] ORS;  Service: General;  Laterality: N/A;   XI ROBOTIC ASSISTED INGUINAL HERNIA REPAIR WITH MESH Bilateral 07/02/2022   Procedure: XI ROBOTIC ASSISTED INGUINAL HERNIA WITH MESH, recurrent;  Surgeon: Henrene Dodge, MD;  Location: [facility] ORS;  Service: General;  Laterality: Bilateral;   Social History   Tobacco Use   Smoking status: Some Days    Types: Cigars    Passive exposure: Past   Smokeless tobacco: Never  Vaping Use   Vaping status: Never Used  Substance Use Topics   Alcohol use: Yes    Alcohol/week: 0.0 standard drinks of alcohol    Comment: occasional   Drug use: No   Social  History   Socioeconomic History   Marital status: Single    Spouse name: Not on file   Number of children: 1   Years of education: 23   Highest education level: 11th grade  Occupational History   Not on file  Tobacco Use   Smoking status: Some Days    Types: Cigars    Passive exposure: Past   Smokeless tobacco: Never  Vaping Use   Vaping status: Never Used  Substance and Sexual Activity   Alcohol use: Yes    Alcohol/week: 0.0 standard drinks of alcohol    Comment: occasional   Drug use: No   Sexual  activity: Yes  Other Topics Concern   Not on file  Social History Narrative   Not on file   Social Drivers of Health   Financial Resource Strain: Patient Declined (03/04/2023)   Overall Financial Resource Strain (CARDIA)    Difficulty of Paying Living Expenses: Patient declined  Food Insecurity: Patient Declined (03/04/2023)   Hunger Vital Sign    Worried About Running Out of Food in the Last Year: Patient declined    Ran Out of Food in the Last Year: Patient declined  Transportation Needs: No Transportation Needs (02/21/2022)   PRAPARE - Administrator, Civil Service (Medical): No    Lack of Transportation (Non-Medical): No  Physical Activity: Insufficiently Active (03/04/2023)   Exercise Vital Sign    Days of Exercise per Week: 3 days    Minutes of Exercise per Session: 10 min  Stress: No Stress Concern Present (03/04/2023)   [employer] of Occupational Health - Occupational Stress Questionnaire    Feeling of Stress : Not at all  Social Connections: Moderately Isolated (03/04/2023)   Social Connection and Isolation Panel [NHANES]    Frequency of Communication with Friends and Family: Once a week    Frequency of Social Gatherings with Friends and Family: More than three times a week    Attends Religious Services: More than 4 times per year    Active Member of [employer] or Organizations: No    Attends Banker Meetings: Never    Marital Status: Never married  Intimate Partner Violence: Not At Risk (03/04/2023)   Humiliation, Afraid, Rape, and Kick questionnaire    Fear of Current or Ex-Partner: No    Emotionally Abused: No    Physically Abused: No    Sexually Abused: No   Family Status  Relation Name Status   Mother  Deceased   Father  Deceased   MGM  Alive   Neg Hx  (Not Specified)  No partnership data on file   Family History  Problem Relation Age of Onset   Hypertension Maternal Grandmother    Prostate cancer Neg Hx    Kidney cancer Neg Hx     Bladder Cancer Neg Hx    No Known Allergies    Review of Systems  Gastrointestinal:  Positive for abdominal pain and heartburn. Negative for constipation, diarrhea, nausea and vomiting.      Objective:     BP 118/82 (Cuff Size: Large)   Pulse 96   Resp 18   Ht 6' (1.829 m)   Wt 196 lb (88.9 kg)   SpO2 99%   BMI 26.58 kg/m  BP Readings from Last 3 Encounters:  02/02/24 118/82  01/13/24 (!) 142/83  09/15/23 139/81   Wt Readings from Last 3 Encounters:  02/02/24 196 lb (88.9 kg)  01/13/24 190 lb (86.2 kg)  09/15/23 192  lb 12.8 oz (87.5 kg)      Physical Exam Constitutional:      Appearance: Normal appearance.  HENT:     Head: Normocephalic and atraumatic.  Eyes:     Conjunctiva/sclera: Conjunctivae normal.  Cardiovascular:     Rate and Rhythm: Normal rate and regular rhythm.  Pulmonary:     Effort: Pulmonary effort is normal.     Breath sounds: Normal breath sounds.  Abdominal:     General: There is no distension.     Palpations: Abdomen is soft.     Tenderness: There is abdominal tenderness. There is no guarding or rebound.     Comments: Mild epigastric tenderness to palpation   Neurological:     Mental Status: He is alert.      No results found for any visits on 02/02/24.  Last CBC Lab Results  Component Value Date   WBC 5.3 08/13/2023   HGB 15.5 08/13/2023   HCT 46.2 08/13/2023   MCV 85.1 08/13/2023   MCH 28.5 08/13/2023   RDW 14.0 08/13/2023   PLT 219 08/13/2023   Last metabolic panel Lab Results  Component Value Date   GLUCOSE 105 (H) 08/13/2023   NA 138 08/13/2023   K 3.6 08/13/2023   CL 108 08/13/2023   CO2 25 08/13/2023   BUN 13 08/13/2023   CREATININE 1.03 08/13/2023   GFRNONAA >60 08/13/2023   CALCIUM 8.8 (L) 08/13/2023   PROT 7.2 08/13/2023   ALBUMIN 4.0 08/13/2023   LABGLOB 2.8 07/10/2017   AGRATIO 1.5 07/10/2017   BILITOT 0.7 08/13/2023   ALKPHOS 52 08/13/2023   AST 19 08/13/2023   ALT 20 08/13/2023   ANIONGAP 5  08/13/2023   Last lipids Lab Results  Component Value Date   CHOL 148 03/04/2023   HDL 47 03/04/2023   LDLCALC 81 03/04/2023   TRIG 102 03/04/2023   CHOLHDL 3.1 03/04/2023   Last hemoglobin A1c Lab Results  Component Value Date   HGBA1C 5.5 02/21/2022   Last thyroid functions Lab Results  Component Value Date   TSH 0.35 (L) 09/02/2023   T4TOTAL 9.0 09/02/2023   Last vitamin D Lab Results  Component Value Date   VD25OH 31 09/02/2023   Last vitamin B12 and Folate Lab Results  Component Value Date   VITAMINB12 836 09/02/2023   FOLATE 20.3 09/02/2023      The 10-year ASCVD risk score (Arnett DK, et al., 2019) is: 8%    Assessment & Plan:   1. Gastroesophageal reflux disease, unspecified whether esophagitis present (Primary): Discussed avoiding trigger symptoms and avoiding laying down 3-4 hours after eating. Will prescribe Prilosec 20 mg as needed for symptoms.   - omeprazole (PRILOSEC) 20 MG capsule; Take 1 capsule (20 mg total) by mouth daily as needed (acid reflux).  Dispense: 90 capsule; Refill: 1   Return for already scheduled.    Margarita Mail, DO

## 2024-02-02 NOTE — Patient Instructions (Signed)
 GERD in Adults: What to Know  Gastroesophageal reflux (GER) is when acid from your stomach flows up into your esophagus. Your esophagus is the part of your body that moves food from your mouth to your stomach. Normally, food goes down and stays in your stomach to be digested. But with GER, food and stomach acid may go back up. You may have a disease called gastroesophageal reflux disease (GERD) if the reflux: Happens often. Causes very bad symptoms. Makes your esophagus sore and swollen. Over time, GERD can make small holes called ulcers in the lining of your esophagus. What are the causes? GERD is caused by a problem with the muscle between your esophagus and stomach. This muscle is called the lower esophageal sphincter (LES). When it's weak or not normal, it doesn't close like it should. This means food and stomach acid can go back up into your esophagus. The muscle can be weak if: You smoke or use products with tobacco in them. You're pregnant. You have a type of hernia called a hiatal hernia. You eat certain foods and drinks. These include: Alcohol. Coffee. Chocolate. Onions. Peppermint. What increases the risk? Being overweight. Having a disease that affects your connective tissue. Taking NSAIDs, such as ibuprofen. What are the signs or symptoms? Heartburn. Trouble swallowing. Pain when you swallow. The feeling of having a lump in your throat. A bitter taste in your mouth. Bad breath. Having an upset or bloated stomach. Burping. Chest pain. Other conditions can also cause chest pain. Make sure you see your health care provider if you have chest pain. Wheezing. This is when you make high-pitched whistling sounds when you breathe, most often when you breathe out. A long-term cough or a cough at night. How is this diagnosed? GERD may be diagnosed based on your medical history and a physical exam. You may also have tests. These may include: An endoscopy. This test looks at your  stomach and esophagus with a small camera. A barium swallow test. This shows the shape and size of your esophagus and how well it's working. Tests of your esophagus to check for: Acid levels. Pressure. How is this treated? Treatment may depend on how bad your symptoms are. It may include: Changes to your diet and daily life. Medicines. Surgery. Follow these instructions at home: Eating and drinking Follow an eating plan as told by your provider. You may need to avoid certain foods and drinks. These may include: Coffee and tea, with or without caffeine. Alcohol. Energy drinks and sports drinks. Fizzy drinks or sodas. Chocolate and cocoa. Peppermint and mint flavorings. Garlic and onions. Horseradish. Spicy and acidic foods. These include: Peppers. Chili powder and curry powder. Vinegar. Hot sauces and BBQ sauce. Citrus fruits and juices. These include: Oranges. Lemons. Limes. Tomato-based foods. These include: Red sauce and pizza with red sauce. Chili. Salsa. Fried and fatty foods. These include: Donuts. [place] fries. Potato chips. High-fat dressings. High-fat meats. These include: Hot dogs and sausage. Rib eye steak. Ham and bacon. High-fat dairy items. These include: Whole milk. Butter. Cream cheese. Eat small meals often. Avoid eating big meals. Avoid drinking lots of liquid with your meals. Try not to eat meals during the 2-3 hours before bedtime. Try not to lie down right after you eat. Do not exercise right after you eat. Lifestyle  If you're overweight, lose an amount of weight that's healthy for you. Ask your provider about a safe weight loss goal. Do not smoke, vape, or use nicotine or tobacco. Wear  loose clothes. Do not wear things that are tight around your waist. When you sleep, try: Raising the head of your bed about 6 inches (15 cm). You can use a wedge to do this. Lying down on your left side. Try to lower your stress. If you need help doing  this, ask your provider. General instructions Take your medicines only as told. Do not take aspirin or ibuprofen unless you're told to. Watch for any changes in your symptoms. Do not bend over if it makes your symptoms worse. Contact a health care provider if: You have new symptoms. You have trouble: Drinking. Swallowing. Eating. It hurts to swallow. You have wheezing. You have a cough that won't go away. Your voice is hoarse. Your symptoms don't get better with treatment. Get help right away if: You have pain all of a sudden in your: Arm. Neck. Jaw. Teeth. Back. You feel sweaty, dizzy, or light-headed all of a sudden. You faint. You have chest pain or shortness of breath. You vomit and the vomit is: Green, yellow, or black. Looks like blood or coffee grounds. Your poop is red, bloody, or black. These symptoms may be an emergency. Call 911 right away. Do not wait to see if the symptoms will go away. Do not drive yourself to the hospital. This information is not intended to replace advice given to you by your health care provider. Make sure you discuss any questions you have with your health care provider. Document Revised: 09/23/2023 Document Reviewed: 04/09/2023 Elsevier Patient Education  2024 [employer].

## 2024-02-23 ENCOUNTER — Telehealth: Payer: Self-pay | Admitting: Surgery

## 2024-02-23 NOTE — Progress Notes (Unsigned)
 12/16/2018  7:59 PM   Joseph Strong [birth year] [id number]  Referring provider: Margarita Mail, DO [address] [place],  [place] [address]  Urological history: 1. BPH with LU TS -PSA (12/2023) 0.7 - cysto (2018) NED - tadalafil 5 mg daily  2. Chronic pelvic pain/dysuria - scrotal US x 2 - NED - CT scans x 2 - NED - MRI NED - colonoscopy (04/2022) - NED  3. ED -Contributing factors of age, BPH, smoking - tadalafil 5 mg daily  4. Prostate cancer screening -PSA (12/2023) 0.7 -baseline PSA (2017) 0.7 at age 63 (slightly > median for age group) -African ancestry -No family history of prostate cancer, breast cancer or ovarian cancer   HPI: Joseph Strong is a 53 y.o. male who presents for follow up.   Previous records reviewed.   At his visit on 01/13/2024, I PSS 7/0.  He is having daytime frequency and nighttime frequency.  He also continues to have some dysuria.  Patient denies any modifying or aggravating factors.  denies any recent UTI's, gross hematuria or suprapubic/flank pain.  Patient denies any fevers, chills, nausea or vomiting.   Last visit cultures were negative.  He was instructed to continue tadalafil 5 mg daily and was given a trial of Vesicare 10 mg daily.   SHIM 25.   He is taking tadalafil 5 mg daily.  Patient still having spontaneous erections.  He denies any pain or curvature with erections.    I PSS ***     Score:  1-7 Mild 8-19 Moderate 20-35 Severe    PMH: Past Medical History:  Diagnosis Date   BPH (benign prostatic hyperplasia)    Burning with urination 01/10/2016   Constipation    Dysrhythmia    "OCCASSIONAL SPEEDING UP OF HEART RATE" PT THINKING IT MAY BE DUE TO HERNIA   Enlarged prostate    Headache    MIGRAINES   Inguinal hernia    left    Scoliosis     Surgical History: Past Surgical History:  Procedure Laterality Date   COLONOSCOPY WITH PROPOFOL N/A 05/10/2022   Procedure: COLONOSCOPY WITH PROPOFOL;   Surgeon: Toney Reil, MD;  Location: [facility] ENDOSCOPY;  Service: Gastroenterology;  Laterality: N/A;   HERNIA REPAIR Left 1998   AND UMBILICAL   INGUINAL HERNIA REPAIR Right 01/11/2016   Procedure: HERNIA REPAIR INGUINAL ADULT;  Surgeon: Kieth Brightly, MD;  Location: [facility] ORS;  Service: General;  Laterality: Right;   INSERTION OF MESH  07/02/2022   Procedure: INSERTION OF MESH- UMBILICAL;  Surgeon: Henrene Dodge, MD;  Location: [facility] ORS;  Service: General;;   TONSILLECTOMY     childhood   UMBILICAL HERNIA REPAIR     infant   UMBILICAL HERNIA REPAIR N/A 07/02/2022   Procedure: HERNIA REPAIR UMBILICAL ADULT open, recurrent; incarcerated;  Surgeon: Henrene Dodge, MD;  Location: [facility] ORS;  Service: General;  Laterality: N/A;   XI ROBOTIC ASSISTED INGUINAL HERNIA REPAIR WITH MESH Bilateral 07/02/2022   Procedure: XI ROBOTIC ASSISTED INGUINAL HERNIA WITH MESH, recurrent;  Surgeon: Henrene Dodge, MD;  Location: [facility] ORS;  Service: General;  Laterality: Bilateral;    Home Medications:  Allergies as of 02/24/2024   No Known Allergies      Medication List        Accurate as of February 23, 2024  7:59 PM. If you have any questions, ask your nurse or doctor.          aspirin EC 81 MG tablet Take 81  mg by mouth daily.   clonazePAM 0.5 MG tablet Commonly known as: KLONOPIN Take 0.5 mg by mouth.   DULoxetine 20 MG capsule Commonly known as: CYMBALTA Take 20 mg daily for two weeks then increase to 20 mg twice daily   ketoconazole 2 % cream Commonly known as: NIZORAL Apply 1 Application topically daily.   omeprazole 20 MG capsule Commonly known as: PRILOSEC Take 1 capsule (20 mg total) by mouth daily as needed (acid reflux).   solifenacin 10 MG tablet Commonly known as: VESICARE Take 1 tablet (10 mg total) by mouth daily.   tadalafil 5 MG tablet Commonly known as: CIALIS TAKE 1 TABLET BY MOUTH DAILY AS NEEDED FOR ERECTILE DYSFUNCTION        Allergies: No Known  Allergies  Family History: Family History  Problem Relation Age of Onset   Hypertension Maternal Grandmother    Prostate cancer Neg Hx    Kidney cancer Neg Hx    Bladder Cancer Neg Hx     Social History:  reports that he has been smoking cigars. He has been exposed to tobacco smoke. He has never used smokeless tobacco. He reports current alcohol use. He reports that he does not use drugs.  ROS: For pertinent review of systems please refer to history of present illness  Physical Exam: There were no vitals taken for this visit.  Constitutional:  Well nourished. Alert and oriented, No acute distress. HEENT: [place] AT, moist mucus membranes.  Trachea midline, no masses. Cardiovascular: No clubbing, cyanosis, or edema. Respiratory: Normal respiratory effort, no increased work of breathing. GI: Abdomen is soft, non tender, non distended, no abdominal masses. Liver and spleen not palpable.  No hernias appreciated.  Stool sample for occult testing is not indicated.   GU: No CVA tenderness.  No bladder fullness or masses.  Patient with circumcised/uncircumcised phallus. ***Foreskin easily retracted***  Urethral meatus is patent.  No penile discharge. No penile lesions or rashes. Scrotum without lesions, cysts, rashes and/or edema.  Testicles are located scrotally bilaterally. No masses are appreciated in the testicles. Left and right epididymis are normal. Rectal: Patient with  normal sphincter tone. Anus and perineum without scarring or rashes. No rectal masses are appreciated. Prostate is approximately *** grams, *** nodules are appreciated. Seminal vesicles are normal. Skin: No rashes, bruises or suspicious lesions. Lymph: No cervical or inguinal adenopathy. Neurologic: Grossly intact, no focal deficits, moving all 4 extremities. Psychiatric: Normal mood and affect.   Laboratory Data: N/A  Pertinent Imaging: N/A  Assessment & Plan:    1. BPH with LUTS -PSA stable  -continue conservative  management, avoiding bladder irritants and timed voiding's -Continue Cialis 5 mg daily  2. Erectile dysfunction -Continue tadalafil 5 mg daily  3. OAB -will have a trial of Vesicare 10 mg daily  -if he continues to have dysuria after a trial of OAB meds, will need to repeat cysto  No follow-ups on file.  These notes generated with voice recognition software. I apologize for typographical errors.  Cloretta Ned  [facility] [address] [place], [place] [address] [phone]

## 2024-02-23 NOTE — Telephone Encounter (Signed)
 Pt is wanting to know what his next step is since his CT of the pelvis is negative. Do you want one of the CMA to refer him out or do you want him to come back in to see you to discuss this? Patient number is [phone].

## 2024-02-24 ENCOUNTER — Encounter: Payer: Self-pay | Admitting: Urology

## 2024-02-24 ENCOUNTER — Ambulatory Visit: Payer: 59 | Admitting: Urology

## 2024-02-24 VITALS — BP 121/84 | HR 81 | Ht 72.0 in | Wt 190.0 lb

## 2024-02-24 DIAGNOSIS — N138 Other obstructive and reflux uropathy: Secondary | ICD-10-CM

## 2024-02-24 DIAGNOSIS — N401 Enlarged prostate with lower urinary tract symptoms: Secondary | ICD-10-CM

## 2024-02-24 DIAGNOSIS — N529 Male erectile dysfunction, unspecified: Secondary | ICD-10-CM | POA: Diagnosis not present

## 2024-02-24 DIAGNOSIS — N3281 Overactive bladder: Secondary | ICD-10-CM

## 2024-02-24 DIAGNOSIS — R300 Dysuria: Secondary | ICD-10-CM

## 2024-02-24 LAB — BLADDER SCAN AMB NON-IMAGING

## 2024-02-24 NOTE — Patient Instructions (Signed)

## 2024-03-04 ENCOUNTER — Other Ambulatory Visit: Payer: Self-pay

## 2024-03-04 ENCOUNTER — Ambulatory Visit
Admission: RE | Admit: 2024-03-04 | Discharge: 2024-03-04 | Disposition: A | Discharge status: Home / Self Care | Attending: Internal Medicine | Admitting: Internal Medicine

## 2024-03-04 ENCOUNTER — Ambulatory Visit
Admission: RE | Admit: 2024-03-04 | Discharge: 2024-03-04 | Disposition: A | Discharge status: Home / Self Care | Source: Ambulatory Visit | Attending: Internal Medicine | Admitting: Internal Medicine

## 2024-03-04 ENCOUNTER — Encounter: Payer: Self-pay | Admitting: Internal Medicine

## 2024-03-04 ENCOUNTER — Ambulatory Visit: Payer: 59 | Admitting: Internal Medicine

## 2024-03-04 VITALS — BP 120/80 | HR 100 | Temp 97.8°F | Resp 18 | Ht 72.0 in | Wt 199.9 lb

## 2024-03-04 DIAGNOSIS — Z13220 Encounter for screening for lipoid disorders: Secondary | ICD-10-CM

## 2024-03-04 DIAGNOSIS — M4125 Other idiopathic scoliosis, thoracolumbar region: Secondary | ICD-10-CM

## 2024-03-04 DIAGNOSIS — Z23 Encounter for immunization: Secondary | ICD-10-CM

## 2024-03-04 DIAGNOSIS — Z0001 Encounter for general adult medical examination with abnormal findings: Secondary | ICD-10-CM | POA: Diagnosis not present

## 2024-03-04 DIAGNOSIS — Z0000 Encounter for general adult medical examination without abnormal findings: Secondary | ICD-10-CM

## 2024-03-04 NOTE — Progress Notes (Deleted)
 Marland Kitchen  ks

## 2024-03-04 NOTE — Progress Notes (Signed)
 Name: Joseph Strong   MRN: [id number]    DOB: June 17, 1971   Date:03/04/2024       Progress Note  Subjective  Chief Complaint  Chief Complaint  Patient presents with   Annual Exam    HPI  Patient presents for annual CPE .   Diet: Regular Exercise: 4 days 30 minutes Last Dental Exam: 2024 Last Eye Exam: 2022  Depression: phq 9 is negative    03/04/2024    3:28 PM 02/02/2024    3:01 PM 09/02/2023    3:19 PM 08/19/2023    2:53 PM 03/04/2023    3:34 PM  Depression screen PHQ 2/9  Decreased Interest 0 0 0 0 0  Down, Depressed, Hopeless 0 0 0 0 0  PHQ - 2 Score 0 0 0 0 0  Altered sleeping   0 0 0  Tired, decreased energy   0 0 0  Change in appetite   0 0 0  Feeling bad or failure about yourself    0 0 0  Trouble concentrating   0 0 0  Moving slowly or fidgety/restless   0 0 0  Suicidal thoughts   0 0 0  PHQ-9 Score   0 0 0  Difficult doing work/chores   Not difficult at all Not difficult at all Not difficult at all    Hypertension:  BP Readings from Last 3 Encounters:  03/04/24 120/80  02/24/24 121/84  02/02/24 118/82    Obesity: Wt Readings from Last 3 Encounters:  03/04/24 199 lb 14.4 oz (90.7 kg)  02/24/24 190 lb (86.2 kg)  02/02/24 196 lb (88.9 kg)   BMI Readings from Last 3 Encounters:  03/04/24 27.11 kg/m  02/24/24 25.77 kg/m  02/02/24 26.58 kg/m     Flowsheet Row Office Visit from 03/04/2024 in [facility]  AUDIT-C Score 1      Single STD testing and prevention (HIV/chl/gon/syphilis):  not applicable Hep C Screening: UTD 2023 Skin cancer: Discussed monitoring for atypical lesions Colorectal cancer: Completed in 04/2022, repeat in 10 years Prostate cancer:  yes Lab Results  Component Value Date   PSA 0.64 02/21/2022   PSA 0.7 11/05/2016    Lung cancer:  Low Dose CT Chest recommended if Age 56-80 years, 30 pack-year currently smoking OR have quit w/in 15years. Patient  is not a candidate for screening   AAA: The USPSTF  recommends one-time screening with ultrasonography in men ages 41 to 75 years who have ever smoked. Patient   is not a candidate for screening  ECG:  08/18/23  Vaccines: reviewed with the patient. Prevnar 20 due, discussed Shingles vaccine was well.  Advanced Care Planning: A voluntary discussion about advance care planning including the explanation and discussion of advance directives.  Discussed health care proxy and Living will, and the patient was able to identify a health care proxy as  Giles Currie (Grandmother).  Patient does not have a living will and power of attorney of health care   Patient Active Problem List   Diagnosis Date Noted   Lumbar radiculopathy 09/05/2022   Recurrent umbilical hernia with incarceration    Bilateral recurrent inguinal hernia without obstruction or gangrene    Chronic pain syndrome 03/04/2021   Encounter for screening colonoscopy 03/04/2021   Disorder of skeletal system 03/04/2021   Problems influencing health status 03/04/2021   Pancreas divisum 10/09/2018   Constipation 10/09/2018   Ventral hernia without obstruction or gangrene 10/09/2018   Prostate cancer screening 09/16/2017  Abdominal pain, periumbilical 12/20/2016   Decreased frequency of bowel movements 01/10/2016   Paraspinal back pain (Left) 12/08/2015   Dizziness, nonspecific 10/31/2015   Abnormal TSH 10/31/2015   Chronic groin pain (Left) 10/31/2015   Abnormal EKG 10/31/2015   Testicular pain (Left) 09/20/2015   S/P unilateral inguinal hernia repair 09/20/2015    Past Surgical History:  Procedure Laterality Date   COLONOSCOPY WITH PROPOFOL N/A 05/10/2022   Procedure: COLONOSCOPY WITH PROPOFOL;  Surgeon: Toney Reil, MD;  Location: [facility] ENDOSCOPY;  Service: Gastroenterology;  Laterality: N/A;   HERNIA REPAIR Left 1998   AND UMBILICAL   INGUINAL HERNIA REPAIR Right 01/11/2016   Procedure: HERNIA REPAIR INGUINAL ADULT;  Surgeon: Kieth Brightly, MD;  Location: [facility] ORS;   Service: General;  Laterality: Right;   INSERTION OF MESH  07/02/2022   Procedure: INSERTION OF MESH- UMBILICAL;  Surgeon: Henrene Dodge, MD;  Location: [facility] ORS;  Service: General;;   TONSILLECTOMY     childhood   UMBILICAL HERNIA REPAIR     infant   UMBILICAL HERNIA REPAIR N/A 07/02/2022   Procedure: HERNIA REPAIR UMBILICAL ADULT open, recurrent; incarcerated;  Surgeon: Henrene Dodge, MD;  Location: [facility] ORS;  Service: General;  Laterality: N/A;   XI ROBOTIC ASSISTED INGUINAL HERNIA REPAIR WITH MESH Bilateral 07/02/2022   Procedure: XI ROBOTIC ASSISTED INGUINAL HERNIA WITH MESH, recurrent;  Surgeon: Henrene Dodge, MD;  Location: [facility] ORS;  Service: General;  Laterality: Bilateral;    Family History  Problem Relation Age of Onset   Hypertension Maternal Grandmother    Prostate cancer Neg Hx    Kidney cancer Neg Hx    Bladder Cancer Neg Hx     Social History   Socioeconomic History   Marital status: Single    Spouse name: Not on file   Number of children: 1   Years of education: 11   Highest education level: 11th grade  Occupational History   Not on file  Tobacco Use   Smoking status: Some Days    Types: Cigars    Passive exposure: Past   Smokeless tobacco: Never  Vaping Use   Vaping status: Never Used  Substance and Sexual Activity   Alcohol use: Yes    Alcohol/week: 0.0 standard drinks of alcohol    Comment: occasional   Drug use: No   Sexual activity: Yes  Other Topics Concern   Not on file  Social History Narrative   Not on file   Social Drivers of Health   Financial Resource Strain: Low Risk  (03/04/2024)   Overall Financial Resource Strain (CARDIA)    Difficulty of Paying Living Expenses: Not hard at all  Food Insecurity: No Food Insecurity (03/04/2024)   Hunger Vital Sign    Worried About Running Out of Food in the Last Year: Never true    Ran Out of Food in the Last Year: Never true  Transportation Needs: No Transportation Needs (03/04/2024)   PRAPARE -  Administrator, Civil Service (Medical): No    Lack of Transportation (Non-Medical): No  Physical Activity: Insufficiently Active (03/04/2024)   Exercise Vital Sign    Days of Exercise per Week: 3 days    Minutes of Exercise per Session: 30 min  Stress: No Stress Concern Present (03/04/2024)   [employer] of Occupational Health - Occupational Stress Questionnaire    Feeling of Stress : Only a little  Social Connections: Moderately Isolated (03/04/2024)   Social Connection and Isolation Panel [NHANES]  Frequency of Communication with Friends and Family: More than three times a week    Frequency of Social Gatherings with Friends and Family: More than three times a week    Attends Religious Services: 1 to 4 times per year    Active Member of [employer] or Organizations: No    Attends Banker Meetings: Never    Marital Status: Never married  Intimate Partner Violence: Not At Risk (03/04/2024)   Humiliation, Afraid, Rape, and Kick questionnaire    Fear of Current or Ex-Partner: No    Emotionally Abused: No    Physically Abused: No    Sexually Abused: No     Current Outpatient Medications:    aspirin EC 81 MG tablet, Take 81 mg by mouth daily., Disp: , Rfl:    clonazePAM (KLONOPIN) 0.5 MG tablet, Take 0.5 mg by mouth., Disp: , Rfl:    DULoxetine (CYMBALTA) 20 MG capsule, Take 20 mg daily for two weeks then increase to 20 mg twice daily, Disp: , Rfl:    ketoconazole (NIZORAL) 2 % cream, Apply 1 Application topically daily., Disp: 60 g, Rfl: 2   omeprazole (PRILOSEC) 20 MG capsule, Take 1 capsule (20 mg total) by mouth daily as needed (acid reflux)., Disp: 90 capsule, Rfl: 1   solifenacin (VESICARE) 10 MG tablet, Take 1 tablet (10 mg total) by mouth daily., Disp: 30 tablet, Rfl: 3   tadalafil (CIALIS) 5 MG tablet, TAKE 1 TABLET BY MOUTH DAILY AS NEEDED FOR ERECTILE DYSFUNCTION, Disp: 90 tablet, Rfl: 3  No Known Allergies   Review of Systems  All other systems  reviewed and are negative.    Objective  Vitals:   03/04/24 1529  BP: 120/80  Pulse: 100  Resp: 18  Temp: 97.8 F (36.6 C)  TempSrc: Oral  SpO2: 98%  Weight: 199 lb 14.4 oz (90.7 kg)  Height: 6' (1.829 m)    Body mass index is 27.11 kg/m.  Physical Exam Constitutional:      Appearance: Normal appearance.  HENT:     Head: Normocephalic and atraumatic.     Mouth/Throat:     Mouth: Mucous membranes are moist.     Pharynx: Oropharynx is clear.  Eyes:     Extraocular Movements: Extraocular movements intact.     Conjunctiva/sclera: Conjunctivae normal.     Pupils: Pupils are equal, round, and reactive to light.  Neck:     Vascular: No carotid bruit.     Comments: No thyromegaly Cardiovascular:     Rate and Rhythm: Normal rate and regular rhythm.  Pulmonary:     Effort: Pulmonary effort is normal.     Breath sounds: Normal breath sounds.  Abdominal:     General: There is no distension.     Palpations: Abdomen is soft.  Musculoskeletal:     Cervical back: No tenderness.     Thoracic back: Scoliosis present.     Right lower leg: No edema.     Left lower leg: No edema.  Lymphadenopathy:     Cervical: No cervical adenopathy.  Skin:    General: Skin is warm and dry.  Neurological:     General: No focal deficit present.     Mental Status: He is alert. Mental status is at baseline.  Psychiatric:        Mood and Affect: Mood normal.        Behavior: Behavior normal.     Last CBC Lab Results  Component Value Date   WBC 5.3  08/13/2023   HGB 15.5 08/13/2023   HCT 46.2 08/13/2023   MCV 85.1 08/13/2023   MCH 28.5 08/13/2023   RDW 14.0 08/13/2023   PLT 219 08/13/2023   Last metabolic panel Lab Results  Component Value Date   GLUCOSE 105 (H) 08/13/2023   NA 138 08/13/2023   K 3.6 08/13/2023   CL 108 08/13/2023   CO2 25 08/13/2023   BUN 13 08/13/2023   CREATININE 1.03 08/13/2023   GFRNONAA >60 08/13/2023   CALCIUM 8.8 (L) 08/13/2023   PROT 7.2 08/13/2023    ALBUMIN 4.0 08/13/2023   LABGLOB 2.8 07/10/2017   AGRATIO 1.5 07/10/2017   BILITOT 0.7 08/13/2023   ALKPHOS 52 08/13/2023   AST 19 08/13/2023   ALT 20 08/13/2023   ANIONGAP 5 08/13/2023   Last lipids Lab Results  Component Value Date   CHOL 148 03/04/2023   HDL 47 03/04/2023   LDLCALC 81 03/04/2023   TRIG 102 03/04/2023   CHOLHDL 3.1 03/04/2023   Last hemoglobin A1c Lab Results  Component Value Date   HGBA1C 5.5 02/21/2022   Last thyroid functions Lab Results  Component Value Date   TSH 0.35 (L) 09/02/2023   T4TOTAL 9.0 09/02/2023   Last vitamin D Lab Results  Component Value Date   VD25OH 31 09/02/2023   Last vitamin B12 and Folate Lab Results  Component Value Date   VITAMINB12 836 09/02/2023   FOLATE 20.3 09/02/2023      Assessment & Plan  1. Annual physical exam (Primary)/Lipid screening: Physical exam completed, health maintenance reviewed and annual labs ordered.   - CBC w/Diff/Platelet - COMPLETE METABOLIC PANEL WITHOUT GFR - Lipid Profile - Pneumococcal conjugate vaccine 20-valent (Prevnar 20)  2. Vaccine for streptococcus pneumoniae and influenza: Prevnar 20 administered today.   - Pneumococcal conjugate vaccine 20-valent (Prevnar 20)  3. Other idiopathic scoliosis, thoracolumbar region: X-rays today to assess degree of curve.   - DG SCOLIOSIS EVAL COMPLETE SPINE 1 VIEW; Future   -Prostate cancer screening and PSA options (with potential risks and benefits of testing vs not testing) were discussed along with recent recs/guidelines. -USPSTF grade A and B recommendations reviewed with patient; age-appropriate recommendations, preventive care, screening tests, etc discussed and encouraged; healthy living encouraged; see AVS for patient education given to patient -Discussed importance of 150 minutes of physical activity weekly, eat two servings of fish weekly, eat one serving of tree nuts ( cashews, pistachios, pecans, almonds.Marland Kitchen) every other day,  eat 6 servings of fruit/vegetables daily and drink plenty of water and avoid sweet beverages.  -Reviewed Health Maintenance: yes

## 2024-03-05 ENCOUNTER — Other Ambulatory Visit: Payer: Self-pay | Admitting: Internal Medicine

## 2024-03-05 DIAGNOSIS — E782 Mixed hyperlipidemia: Secondary | ICD-10-CM

## 2024-03-05 LAB — CBC WITH DIFFERENTIAL/PLATELET
Absolute Lymphocytes: 1865 {cells}/uL (ref 850–3900)
Absolute Monocytes: 756 {cells}/uL (ref 200–950)
Basophils Absolute: 50 {cells}/uL (ref 0–200)
Basophils Relative: 0.8 %
Eosinophils Absolute: 391 {cells}/uL (ref 15–500)
Eosinophils Relative: 6.2 %
HCT: 49.9 % (ref 38.5–50.0)
Hemoglobin: 15.9 g/dL (ref 13.2–17.1)
MCH: 27.8 pg (ref 27.0–33.0)
MCHC: 31.9 g/dL — ABNORMAL LOW (ref 32.0–36.0)
MCV: 87.4 fL (ref 80.0–100.0)
MPV: 11.3 fL (ref 7.5–12.5)
Monocytes Relative: 12 %
Neutro Abs: 3238 {cells}/uL (ref 1500–7800)
Neutrophils Relative %: 51.4 %
Platelets: 222 10*3/uL (ref 140–400)
RBC: 5.71 10*6/uL (ref 4.20–5.80)
RDW: 12.5 % (ref 11.0–15.0)
Total Lymphocyte: 29.6 %
WBC: 6.3 10*3/uL (ref 3.8–10.8)

## 2024-03-05 LAB — COMPLETE METABOLIC PANEL WITHOUT GFR
AG Ratio: 1.6 (calc) (ref 1.0–2.5)
ALT: 22 U/L (ref 9–46)
AST: 19 U/L (ref 10–35)
Albumin: 4.2 g/dL (ref 3.6–5.1)
Alkaline phosphatase (APISO): 60 U/L (ref 35–144)
BUN: 15 mg/dL (ref 7–25)
CO2: 24 mmol/L (ref 20–32)
Calcium: 9.1 mg/dL (ref 8.6–10.3)
Chloride: 106 mmol/L (ref 98–110)
Creat: 1.28 mg/dL (ref 0.70–1.30)
Globulin: 2.7 g/dL (ref 1.9–3.7)
Glucose, Bld: 78 mg/dL (ref 65–99)
Potassium: 4.4 mmol/L (ref 3.5–5.3)
Sodium: 137 mmol/L (ref 135–146)
Total Bilirubin: 0.3 mg/dL (ref 0.2–1.2)
Total Protein: 6.9 g/dL (ref 6.1–8.1)

## 2024-03-05 LAB — LIPID PANEL
Cholesterol: 183 mg/dL (ref <200)
HDL: 51 mg/dL (ref >=40)
LDL Cholesterol (Calc): 110 mg/dL — ABNORMAL HIGH
Non-HDL Cholesterol (Calc): 132 mg/dL — ABNORMAL HIGH (ref <130)
Total CHOL/HDL Ratio: 3.6 (calc) (ref <5.0)
Triglycerides: 109 mg/dL (ref <150)

## 2024-03-05 MED ORDER — ROSUVASTATIN CALCIUM 10 MG PO TABS: 10.0000 mg | ORAL_TABLET | Freq: Every day | ORAL | 3 refills | Status: AC

## 2024-03-18 ENCOUNTER — Ambulatory Visit: Admitting: Internal Medicine

## 2024-03-18 ENCOUNTER — Encounter: Payer: Self-pay | Admitting: Internal Medicine

## 2024-03-18 ENCOUNTER — Other Ambulatory Visit: Payer: Self-pay

## 2024-03-18 VITALS — BP 130/82 | HR 98 | Temp 98.2°F | Resp 18 | Ht 72.0 in | Wt 202.1 lb

## 2024-03-18 DIAGNOSIS — N23 Unspecified renal colic: Secondary | ICD-10-CM | POA: Diagnosis not present

## 2024-03-18 DIAGNOSIS — R1012 Left upper quadrant pain: Secondary | ICD-10-CM

## 2024-03-18 LAB — POCT URINALYSIS DIPSTICK
Bilirubin, UA: NEGATIVE
Blood, UA: NEGATIVE
Glucose, UA: NEGATIVE
Ketones, UA: NEGATIVE
Leukocytes, UA: NEGATIVE
Nitrite, UA: NEGATIVE
Protein, UA: NEGATIVE
Spec Grav, UA: 1.02 (ref 1.010–1.025)
Urobilinogen, UA: 0.2 U/dL
pH, UA: 5 (ref 5.0–8.0)

## 2024-03-18 NOTE — Progress Notes (Signed)
 Acute Office Visit  Subjective:     Patient ID: Joseph Strong, male    DOB: [birth year], 53 y.o.   MRN: [id number]  Chief Complaint  Patient presents with   Flank Pain    Patient stated hernia mesh may be giving him issues. Pain radiates down leg    Flank Pain Associated symptoms include abdominal pain and dysuria. Pertinent negatives include no fever.   Patient is in today for ongoing abdominal pain since he's had inguinal hernia fixation with mesh.   Discussed the use of AI scribe software for clinical note transcription with the patient, who gave verbal consent to proceed.  History of Present Illness The patient, with a history of surgical mesh placement, presents with a complex set of symptoms. He describes a sensation of pressure or tightness in various parts of the body, including the abdomen and chest. The discomfort is described as 'rapid' and similar to the feeling of high blood pressure. The patient also reports a burning sensation during urination, which has been ongoing for a couple of months. Despite multiple tests and imaging studies, no clear cause for these symptoms has been identified. The patient reports some improvement in symptoms with the Duloxetine  20 mg but is hesitant to increase. He was seen by his surgeon in February, had a CT of the pelvis which was virtually normal. Prilosec improving GERD symptoms.     Review of Systems  Constitutional:  Negative for chills and fever.  Gastrointestinal:  Positive for abdominal pain and heartburn. Negative for constipation, diarrhea, nausea and vomiting.  Genitourinary:  Positive for dysuria and flank pain. Negative for frequency, hematuria and urgency.        Objective:    BP 130/82 (Cuff Size: Large)   Pulse 98   Temp 98.2 F (36.8 C) (Oral)   Resp 18   Ht 6' (1.829 m)   Wt 202 lb 1.6 oz (91.7 kg)   SpO2 99%   BMI 27.41 kg/m    Physical Exam Constitutional:      Appearance: Normal appearance.  HENT:      Head: Normocephalic and atraumatic.  Eyes:     Conjunctiva/sclera: Conjunctivae normal.  Cardiovascular:     Rate and Rhythm: Normal rate and regular rhythm.  Pulmonary:     Effort: Pulmonary effort is normal.     Breath sounds: Normal breath sounds.  Abdominal:     General: Bowel sounds are normal. There is no distension.     Palpations: Abdomen is soft.     Tenderness: There is abdominal tenderness. There is no guarding or rebound.     Comments: Ptp in the LUQ  Skin:    General: Skin is warm and dry.  Neurological:     General: No focal deficit present.     Mental Status: He is alert. Mental status is at baseline.  Psychiatric:        Mood and Affect: Mood normal.        Behavior: Behavior normal.     No results found for any visits on 03/18/24.      Assessment & Plan:   Assessment & Plan Burning sensation during urination Symptoms may be due to overactive bladder or chronic pain from surgical mesh. VESIcare  may provide partial relief. - Continue VESIcare . - Cystoscopy on May 6 to evaluate bladder. - UA here in the office negative for infection.   Chronic pain due to surgical mesh Pain likely neuropathic, possibly related to mesh and scar tissue. Managed  with Cymbalta . - Increase Cymbalta  if pain persists. - Order CT scan of abdomen.  Hyperlipidemia Cholesterol levels slightly elevated, now on a statin which he's been taking daily.   GERD GERD symptoms resolved with Prilosec.   - POCT urinalysis dipstick - CT ABDOMEN LIMITED W CONTRAST; Future   Return if symptoms worsen or fail to improve.  Rockney Cid, DO

## 2024-03-23 NOTE — Telephone Encounter (Signed)
 Copied from CRM [id number]. Topic: Referral - Status >> Mar 23, 2024  3:16 PM Ja-Kwan M wrote: Reason for CRM: Patient reports that he has not heard from anyone regarding scheduling him for a CT scan of the abdomen. Call back# [phone]

## 2024-03-24 ENCOUNTER — Telehealth: Payer: Self-pay | Admitting: Internal Medicine

## 2024-03-24 NOTE — Telephone Encounter (Signed)
 Called patient again NA. Could not leave message. Insurance will not cover CT

## 2024-03-24 NOTE — Telephone Encounter (Signed)
 Tried to call patient, voicemail box not set up to leave messages

## 2024-03-24 NOTE — Telephone Encounter (Signed)
 Copied from CRM [id number]. Topic: General - Call Back - No Documentation >> Mar 24, 2024 12:02 PM Everette C wrote: Reason for CRM: The patient has returned a missed call from the practice. Please contact further when possible.

## 2024-03-24 NOTE — Telephone Encounter (Signed)
 Sent message to [place] in referral to work on

## 2024-03-24 NOTE — Telephone Encounter (Signed)
 Per referral office Latisha CT was denied due to not being medically necessary

## 2024-03-30 ENCOUNTER — Other Ambulatory Visit: Admitting: Urology

## 2024-04-28 ENCOUNTER — Other Ambulatory Visit: Admitting: Urology

## 2024-06-07 ENCOUNTER — Other Ambulatory Visit: Admitting: Urology

## 2024-06-23 ENCOUNTER — Ambulatory Visit: Admitting: Surgery

## 2024-06-28 ENCOUNTER — Ambulatory Visit (INDEPENDENT_AMBULATORY_CARE_PROVIDER_SITE_OTHER): Admitting: Surgery

## 2024-06-28 ENCOUNTER — Encounter: Payer: Self-pay | Admitting: Surgery

## 2024-06-28 VITALS — BP 128/86 | HR 91 | Temp 98.7°F | Ht 72.0 in | Wt 203.0 lb

## 2024-06-28 DIAGNOSIS — R1031 Right lower quadrant pain: Secondary | ICD-10-CM | POA: Diagnosis not present

## 2024-06-28 DIAGNOSIS — K4091 Unilateral inguinal hernia, without obstruction or gangrene, recurrent: Secondary | ICD-10-CM

## 2024-06-28 NOTE — Progress Notes (Unsigned)
 06/28/2024  History of Present Illness: Joseph Strong is a 53 y.o. male presenting for evaluation of right inguinal pain.  The patient is status post open left inguinal and umbilical hernia repair in 1998 followed by open right inguinal hernia repair in 2017.  He subsequently had recurrence of all 3 locations and underwent a robotic recurrent bilateral inguinal hernia repair with an open recurrent umbilical hernia repair on 07/02/2022.  The patient started having some discomfort in the right groin last year around October and I saw him for this in the office on 09/15/2023.  We had ordered a CT scan of the pelvis but it took a long time for it to get done and was done on 01/06/2024.  This did not show any evidence of recurrent hernia at that time.  The patient presents today reporting that over the last few months he has had worsening issues with the right groin area with some discomfort and tenderness radiating towards the right thigh.  The pain is not constant but feels it intermittently particularly if he is more active or doing something strenuous at work.  He does do heavy lifting at work.  He does not feel a specific area of bulging like before but does feel a stinging sensation in the lower groin area.  He reports that the numbness in his left thigh is better.  Denies any troubles with the umbilicus.  Past Medical History: Past Medical History:  Diagnosis Date   BPH (benign prostatic hyperplasia)    Burning with urination 01/10/2016   Constipation    Dysrhythmia    OCCASSIONAL SPEEDING UP OF HEART RATE PT THINKING IT MAY BE DUE TO HERNIA   Enlarged prostate    Headache    MIGRAINES   Inguinal hernia    left    Scoliosis      Past Surgical History: Past Surgical History:  Procedure Laterality Date   COLONOSCOPY WITH PROPOFOL  N/A 05/10/2022   Procedure: COLONOSCOPY WITH PROPOFOL ;  Surgeon: Unk Corinn Skiff, MD;  Location: [facility];  Service: Gastroenterology;  Laterality: N/A;    HERNIA REPAIR Left 1998   AND UMBILICAL   INGUINAL HERNIA REPAIR Right 01/11/2016   Procedure: HERNIA REPAIR INGUINAL ADULT;  Surgeon: Louanne KANDICE Muse, MD;  Location: [facility];  Service: General;  Laterality: Right;   INSERTION OF MESH  07/02/2022   Procedure: INSERTION OF MESH- UMBILICAL;  Surgeon: Desiderio Schanz, MD;  Location: [facility] OR[facility];  Service: General;;   TONSILLECTOMY     childhood   UMBILICAL HERNIA REPAIR     infant   UMBILICAL HERNIA REPAIR N/A 07/02/2022   Procedure: HERNIA REPAIR UMBILICAL ADULT open, recurrent; incarcerated;  Surgeon: Desiderio Schanz, MD;  Location: [facility];  Service: General;  Laterality: N/A;   XI ROBOTIC ASSISTED INGUINAL HERNIA REPAIR WITH MESH Bilateral 07/02/2022   Procedure: XI ROBOTIC ASSISTED INGUINAL HERNIA WITH MESH, recurrent;  Surgeon: Desiderio Schanz, MD;  Location: [facility] OR[facility];  Service: General;  Laterality: Bilateral;    Home Medications: Prior to Admission medications   Medication Sig Start Date End Date Taking? Authorizing Provider  aspirin EC 81 MG tablet Take 81 mg by mouth daily.    [provider]  clonazePAM (KLONOPIN) 0.5 MG tablet Take 0.5 mg by mouth. 09/30/23   [provider]  DULoxetine  (CYMBALTA ) 20 MG capsule Take 20 mg daily for two weeks then increase to 20 mg twice daily 09/30/23   [provider]  ketoconazole  (NIZORAL ) 2 % cream Apply 1 Application  topically daily. 10/06/23   McDonald, Juliene SAUNDERS, DPM  omeprazole  (PRILOSEC) 20 MG capsule Take 1 capsule (20 mg total) by mouth daily as needed (acid reflux). 02/02/24   Bernardo Fend, DO  rosuvastatin  (CRESTOR ) 10 MG tablet Take 1 tablet (10 mg total) by mouth daily. 03/05/24   Bernardo Fend, DO  solifenacin  (VESICARE ) 10 MG tablet Take 1 tablet (10 mg total) by mouth daily. 01/13/24   McGowan, Clotilda A, PA-C  tadalafil  (CIALIS ) 5 MG tablet TAKE 1 TABLET BY MOUTH DAILY AS NEEDED FOR ERECTILE DYSFUNCTION 12/18/23   Helon Clotilda A, PA-C     Allergies: No Known Allergies  Review of Systems: Review of Systems  Constitutional:  Negative for chills and fever.  Respiratory:  Negative for shortness of breath.   Cardiovascular:  Negative for chest pain.  Gastrointestinal:  Positive for abdominal pain (right groin, extending to thigh). Negative for nausea and vomiting.  Genitourinary:  Negative for dysuria.    Physical Exam BP 128/86   Pulse 91   Temp 98.7 F (37.1 C) (Oral)   Ht 6' (1.829 m)   Wt 203 lb (92.1 kg)   SpO2 98%   BMI 27.53 kg/m  CONSTITUTIONAL: No acute distress, well-nourished HEENT:  Normocephalic, atraumatic, extraocular motion intact. RESPIRATORY:  Lungs are clear, and breath sounds are equal bilaterally. Normal respiratory effort without pathologic use of accessory muscles. CARDIOVASCULAR: Heart is regular without murmurs, gallops, or rubs. GI: The abdomen is soft, nondistended, with localized discomfort in the right groin towards the crease with the thigh.  On exam, there may be a subtle recurrence of a hernia in the lower aspect of the groin.  But is not obvious with each time that he coughs or strains.  There is no concerns or palpable areas in the left groin or at the umbilicus.  NEUROLOGIC:  Motor and sensation is grossly normal.  Cranial nerves are grossly intact. PSYCH:  Alert and oriented to person, place and time. Affect is normal.  Labs/Imaging: CT pelvis on 01/06/2024: IMPRESSION: 1. No acute findings or explanation for the patient's symptoms. 2. Possible inguinal herniorrhaphy changes bilaterally. No recurrent hernia identified. 3. Mildly prominent stool throughout the visualized colon.  Assessment and Plan: This is a 53 y.o. male status post robotic assisted recurrent bilateral inguinal hernia repair and open recurrent umbilical hernia repair.  - On exam, there may be a subtle protrusion of tissue at the lower groin area.  However this does not reproduce with each time that he coughs  or strains.  Nonetheless, given that he has been having symptoms in the right groin area for some time now, it would be prudent to obtain further imaging.  Discussed with him that his CT scan 6 months ago did not show any evidence of hernia recurrence.  However given that it is difficult to palpate unless he truly strains or coughs harder, it may be better to obtain an ultrasound of the right groin to further evaluate as this will be more of a dynamic study.  The patient is in agreement with this. - Discussed with patient that if hernia is truly found in the right groin, not we will be talking about a second recurrence in the right groin alone, with him having had both open and robotic approach repairs.  I think it may be better if he does have a hernia recurrence in the right groin, to refer him to a tertiary center given the increased complexity for the repair. - Patient will be  scheduled for ultrasound and we will contact him with the results and further plans.  I spent 30 minutes dedicated to the care of this patient on the date of this encounter to include pre-visit review of records, face-to-face time with the patient discussing diagnosis and management, and any post-visit coordination of care.   Aloysius Sheree Plant, MD [facility]

## 2024-06-28 NOTE — Patient Instructions (Addendum)
 We have you scheduled for a Ultrasound at [facility] tomorrow 06/29/24 at 2:30pm, please arrive at 2:15pm. If you need to reschedule you may call them at [phone]   Dr Desiderio will call you with Results and what the next steps will be

## 2024-06-29 ENCOUNTER — Ambulatory Visit

## 2024-07-02 ENCOUNTER — Ambulatory Visit
Admission: RE | Admit: 2024-07-02 | Discharge: 2024-07-02 | Disposition: A | Discharge status: Home / Self Care | Source: Ambulatory Visit | Attending: Surgery | Admitting: Surgery

## 2024-07-02 DIAGNOSIS — K4091 Unilateral inguinal hernia, without obstruction or gangrene, recurrent: Secondary | ICD-10-CM | POA: Diagnosis present

## 2024-07-06 ENCOUNTER — Other Ambulatory Visit: Admitting: Urology

## 2024-07-09 ENCOUNTER — Telehealth: Payer: Self-pay

## 2024-07-09 NOTE — Telephone Encounter (Signed)
-----   Message from [place] sent at 07/09/2024 11:32 AM EDT ----- Regarding: U/S results and referral Hi,  I tried calling this patient today but didn't answer and voicemail not set up.  I saw him last week for possible recurrent right inguinal hernia.  He's had issues with hernia recurrences and has had umbilical, and bilateral inguinal repairs both open and robotic.  He had an ultrasound of the right groin on 07/02/24 which shows an area that could be a recurrence vs scar tissue. On exam, I thought I could feel a recurrence.  I told him in the office that if he did have a hernia, we would refer him to tertiary center for surgical repair.  Would y'all be able to call him to inform of ultrasound results and to do a referral either to [facility] or [facility] based on his preference for recurrent right inguinal hernia repair?  Thanks!  [employer]

## 2024-07-09 NOTE — Telephone Encounter (Signed)
 Patient called back and informed of results. He would like to be referred to [facility].   Referral has been sent to [facility], all images have been Power Shared to their system. Patient is aware they will call him to schedule this.

## 2024-07-12 ENCOUNTER — Ambulatory Visit
Admission: RE | Admit: 2024-07-12 | Discharge: 2024-07-12 | Disposition: A | Discharge status: Home / Self Care | Source: Ambulatory Visit | Attending: Student | Admitting: Student

## 2024-07-12 ENCOUNTER — Ambulatory Visit
Admission: RE | Admit: 2024-07-12 | Discharge: 2024-07-12 | Disposition: A | Discharge status: Home / Self Care | Payer: Worker's Compensation | Source: Ambulatory Visit | Attending: Student | Admitting: Student

## 2024-07-12 ENCOUNTER — Other Ambulatory Visit: Payer: Self-pay | Admitting: Student

## 2024-07-12 DIAGNOSIS — Y990 Civilian activity done for income or pay: Secondary | ICD-10-CM

## 2024-07-12 DIAGNOSIS — M7989 Other specified soft tissue disorders: Secondary | ICD-10-CM | POA: Insufficient documentation

## 2024-07-12 DIAGNOSIS — M79631 Pain in right forearm: Secondary | ICD-10-CM | POA: Diagnosis present

## 2024-07-14 ENCOUNTER — Other Ambulatory Visit: Payer: Self-pay

## 2024-07-14 DIAGNOSIS — K4091 Unilateral inguinal hernia, without obstruction or gangrene, recurrent: Secondary | ICD-10-CM

## 2024-07-24 ENCOUNTER — Other Ambulatory Visit: Payer: Self-pay | Admitting: Urology

## 2024-07-24 DIAGNOSIS — N3281 Overactive bladder: Secondary | ICD-10-CM

## 2024-08-03 ENCOUNTER — Encounter: Payer: Self-pay | Admitting: Urology

## 2024-08-08 ENCOUNTER — Emergency Department

## 2024-08-08 ENCOUNTER — Emergency Department
Admission: EM | Admit: 2024-08-08 | Discharge: 2024-08-08 | Disposition: A | Discharge status: Home / Self Care | Attending: Emergency Medicine | Admitting: Emergency Medicine

## 2024-08-08 ENCOUNTER — Other Ambulatory Visit: Payer: Self-pay

## 2024-08-08 DIAGNOSIS — H81399 Other peripheral vertigo, unspecified ear: Secondary | ICD-10-CM | POA: Diagnosis not present

## 2024-08-08 DIAGNOSIS — R42 Dizziness and giddiness: Secondary | ICD-10-CM | POA: Diagnosis present

## 2024-08-08 DIAGNOSIS — R1030 Lower abdominal pain, unspecified: Secondary | ICD-10-CM | POA: Diagnosis not present

## 2024-08-08 LAB — CBC
HCT: 47.3 % (ref 39.0–52.0)
Hemoglobin: 15.1 g/dL (ref 13.0–17.0)
MCH: 28 pg (ref 26.0–34.0)
MCHC: 31.9 g/dL (ref 30.0–36.0)
MCV: 87.6 fL (ref 80.0–100.0)
Platelets: 195 K/uL (ref 150–400)
RBC: 5.4 MIL/uL (ref 4.22–5.81)
RDW: 14.6 % (ref 11.5–15.5)
WBC: 5.3 K/uL (ref 4.0–10.5)
nRBC: 0 % (ref 0.0–0.2)

## 2024-08-08 LAB — URINALYSIS, ROUTINE W REFLEX MICROSCOPIC
Bilirubin Urine: NEGATIVE
Glucose, UA: NEGATIVE mg/dL
Hgb urine dipstick: NEGATIVE
Ketones, ur: NEGATIVE mg/dL
Leukocytes,Ua: NEGATIVE
Nitrite: NEGATIVE
Protein, ur: NEGATIVE mg/dL
Specific Gravity, Urine: 1.002 — ABNORMAL LOW (ref 1.005–1.030)
pH: 8 (ref 5.0–8.0)

## 2024-08-08 LAB — COMPREHENSIVE METABOLIC PANEL WITH GFR
ALT: 30 U/L (ref 0–44)
AST: 27 U/L (ref 15–41)
Albumin: 3.3 g/dL — ABNORMAL LOW (ref 3.5–5.0)
Alkaline Phosphatase: 51 U/L (ref 38–126)
Anion gap: 10 (ref 5–15)
BUN: 16 mg/dL (ref 6–20)
CO2: 22 mmol/L (ref 22–32)
Calcium: 8.7 mg/dL — ABNORMAL LOW (ref 8.9–10.3)
Chloride: 105 mmol/L (ref 98–111)
Creatinine, Ser: 1.28 mg/dL — ABNORMAL HIGH (ref 0.61–1.24)
GFR, Estimated: >60 mL/min (ref >60)
Glucose, Bld: 160 mg/dL — ABNORMAL HIGH (ref 70–99)
Potassium: 3.9 mmol/L (ref 3.5–5.1)
Sodium: 137 mmol/L (ref 135–145)
Total Bilirubin: 0.5 mg/dL (ref 0.0–1.2)
Total Protein: 6.3 g/dL — ABNORMAL LOW (ref 6.5–8.1)

## 2024-08-08 LAB — LIPASE, BLOOD: Lipase: 32 U/L (ref 11–51)

## 2024-08-08 MED ORDER — SODIUM CHLORIDE 0.9 % IV BOLUS
500.0000 mL | Freq: Once | INTRAVENOUS | Status: AC
  Administered 2024-08-08: 500 mL via INTRAVENOUS

## 2024-08-08 MED ORDER — MECLIZINE HCL 25 MG PO TABS
25.0000 mg | ORAL_TABLET | Freq: Once | ORAL | Status: AC
  Administered 2024-08-08: 25 mg via ORAL
  Filled 2024-08-08: qty 1

## 2024-08-08 MED ORDER — MECLIZINE HCL 25 MG PO TABS: 25.0000 mg | ORAL_TABLET | Freq: Three times a day (TID) | ORAL | 0 refills | Status: AC | PRN

## 2024-08-08 MED ORDER — ONDANSETRON HCL 4 MG/2ML IJ SOLN
4.0000 mg | Freq: Once | INTRAMUSCULAR | Status: AC
  Administered 2024-08-08: 4 mg via INTRAVENOUS
  Filled 2024-08-08: qty 2

## 2024-08-08 NOTE — ED Provider Notes (Signed)
 [facility] Provider Note    Event Date/Time   First MD Initiated Contact with Patient 08/08/24 0715     (approximate)   History   Dizziness   HPI  Joseph Strong is a 53 y.o. male with a history of migraines and BPH as well as multiple hernia surgeries who presents with dizziness, acute onset this morning, described as a spinning or movement sensation, associated with nausea but no vomiting.  He also reports some lower abdominal pain but states that this is chronic related to his hernias and is unchanged.  He is following with a surgery specialist at an outside hospital later this week for this problem.  The patient denies any weakness or numbness.  He has no vision changes or difficulty speaking.  He has not had dizziness like this before he does report some ringing in both of his ears but no hearing loss.  I reviewed the past medical records.  The patient's most recent outpatient encounter was with Dr. Desiderio from surgery on 8/4 for evaluation of right inguinal pain.  He has had prior inguinal hernia repairs.   Physical Exam   Triage Vital Signs: ED Triage Vitals  Encounter Vitals Group     BP 08/08/24 0705 131/88     Girls Systolic BP Percentile --      Girls Diastolic BP Percentile --      Boys Systolic BP Percentile --      Boys Diastolic BP Percentile --      Pulse Rate 08/08/24 0705 80     Resp 08/08/24 0705 18     Temp 08/08/24 0706 97.7 F (36.5 C)     Temp src --      SpO2 08/08/24 0705 99 %     Weight 08/08/24 0707 200 lb (90.7 kg)     Height 08/08/24 0707 6' (1.829 m)     Head Circumference --      Peak Flow --      Pain Score 08/08/24 0706 7     Pain Loc --      Pain Education --      Exclude from Growth Chart --     Most recent vital signs: Vitals:   08/08/24 0900 08/08/24 1130  BP: 112/78 119/85  Pulse: 66 60  Resp: 18 20  Temp:    SpO2: 100% 100%     General: Alert, well-appearing, no distress.  CV:  Good peripheral  perfusion.  Resp:  Normal effort.  Abd:  No distention.  Other:  EOMI.  PERRLA.  No nystagmus.  No facial droop.  Normal speech.  Motor and sensory intact in all extremities.  No ataxia on finger-to-nose.  No pronator drift.   ED Results / Procedures / Treatments   Labs (all labs ordered are listed, but only abnormal results are displayed) Labs Reviewed  COMPREHENSIVE METABOLIC PANEL WITH GFR - Abnormal; Notable for the following components:      Result Value   Glucose, Bld 160 (*)    Creatinine, Ser 1.28 (*)    Calcium  8.7 (*)    Total Protein 6.3 (*)    Albumin 3.3 (*)    All other components within normal limits  URINALYSIS, ROUTINE W REFLEX MICROSCOPIC - Abnormal; Notable for the following components:   Color, Urine COLORLESS (*)    APPearance CLEAR (*)    Specific Gravity, Urine 1.002 (*)    All other components within normal limits  LIPASE, BLOOD  CBC  EKG  ED ECG REPORT I, Waylon Cassis, the attending physician, personally viewed and interpreted this ECG.  Date: 08/08/2024 EKG Time: 0711 Rate: 73 Rhythm: normal sinus rhythm QRS Axis: normal Intervals: normal ST/T Wave abnormalities: normal Narrative Interpretation: no evidence of acute ischemia    RADIOLOGY  MR brain:   IMPRESSION:  1. No acute intracranial abnormality.  2. Mild chronic small vessel disease is stable from MRI last year.    PROCEDURES:  Critical Care performed: No  Procedures   MEDICATIONS ORDERED IN ED: Medications  sodium chloride  0.9 % bolus 500 mL (0 mLs Intravenous Stopped 08/08/24 0914)  meclizine  (ANTIVERT ) tablet 25 mg (25 mg Oral Given 08/08/24 0816)  ondansetron  (ZOFRAN ) injection 4 mg (4 mg Intravenous Given 08/08/24 0817)     IMPRESSION / MDM / ASSESSMENT AND PLAN / ED COURSE  I reviewed the triage vital signs and the nursing notes.  53 year old male with PMH as noted above presents with spinning type dizziness since this morning.  Physical exam including  thorough neurologic exam, is unremarkable.  Differential diagnosis includes, but is not limited to, full vertigo, less likely central vertigo, dehydration, electrolyte abnormality, other metabolic cause, viral syndrome, other infection.  The no evidence of cardiac cause.  We will obtain basic labs, EKG, give meclizine , and reassess.  Patient's presentation is most consistent with acute complicated illness / injury requiring diagnostic workup.  The patient is on the cardiac monitor to evaluate for evidence of arrhythmia and/or significant heart rate changes.  ----------------------------------------- 11:07 AM on 08/08/2024 -----------------------------------------  The patient reports improved but still present dizziness.  He was told in the past that he may have had a TIA or mini stroke.  Although he has no neurologic deficits on exam, and my suspicion for acute CVA or TIA causing central vertigo is low, given his age and based on shared decision making with him, we will obtain an MRI to rule out CNS etiology.  ----------------------------------------- 12:49 PM on 08/08/2024 -----------------------------------------  MRI is negative.  At this time, the patient is stable for discharge home.  I counseled him on the results of the workup.  I gave strict return precautions, and he expressed understanding.  I have prescribed meclizine  for symptomatic treatment at home.   FINAL CLINICAL IMPRESSION(S) / ED DIAGNOSES   Final diagnoses:  Peripheral vertigo, unspecified laterality     Rx / DC Orders   ED Discharge Orders          Ordered    meclizine  (ANTIVERT ) 25 MG tablet  3 times daily PRN        08/08/24 1248             Note:  This document was prepared using Dragon voice recognition software and may include unintentional dictation errors.    Cassis Waylon, MD 08/08/24 1249

## 2024-08-08 NOTE — ED Triage Notes (Signed)
 Pt to ED for waking up with dizziness, states it like came from my stomach.+LLQ pain,  +nausea. Hx hernia surgeries. Ambulatory to triage, NAD noted.

## 2024-08-08 NOTE — ED Notes (Signed)
 Pt ambulatory to restroom. Urine Sample cup provided. Pt states that they forgot to collect urine. Pt returned to bed at this time.

## 2024-08-08 NOTE — Discharge Instructions (Addendum)
 You may take the meclizine  as needed for dizziness.  Follow-up with your regular doctor.  Return to the ER for new, worsening, or persistent severe dizziness, headache, weakness or numbness, difficulty with balance, abdominal pain, or any other new or worsening symptoms that concern you.

## 2024-08-08 NOTE — ED Notes (Signed)
 Patient transported to MRI

## 2024-08-09 ENCOUNTER — Other Ambulatory Visit: Admitting: Urology

## 2024-08-20 NOTE — Progress Notes (Deleted)
   08/26/2024 6:32 AM   Joseph Strong 1971/11/13 [id number]  Cystoscopy Procedure Note:  Indication:  Chronic dysuria, LUTS, pelvic pain  Multiple negative US /CT, neg cysto in 2018 Recently seen PA McGowan - referred for interval cystoscopy for ongoing dysuria On daily Cialis    After informed consent and discussion of the procedure and its risks, Joseph Strong was positioned and prepped in the standard fashion. Cystoscopy was performed with a flexible cystoscope. The urethra, bladder neck and bladder mucosa were visualized in a systematic fashion. The ureteral orifices were noted in orthotopic location and orientation. There were no bladder mucosal lesions, stones, debris or anatomic variants noted. The prostate gland was {bglistcystoprostatefindings:33375}.  Imaging: Recent {bglistimagingtype:33376} reviewed - no GU abnormalities noted  Findings: ***  Assessment and Plan: ***  [name], MD 08/20/2024

## 2024-08-26 ENCOUNTER — Other Ambulatory Visit: Admitting: Urology

## 2024-08-27 NOTE — Progress Notes (Deleted)
   09/02/2024 6:42 AM   Joseph Strong 06-20-1971 [id number]  Cystoscopy Procedure Note:  Indication:  Chronic dysuria, LUTS, pelvic pain  Multiple negative US /CT, neg cysto in 2018 Recently seen PA McGowan - referred for interval cystoscopy for ongoing dysuria On daily Cialis    After informed consent and discussion of the procedure and its risks, Joseph Strong was positioned and prepped in the standard fashion. Cystoscopy was performed with a flexible cystoscope. The urethra, bladder neck and bladder mucosa were visualized in a systematic fashion. The ureteral orifices were noted in orthotopic location and orientation. There were no bladder mucosal lesions, stones, debris or anatomic variants noted. The prostate gland was {bglistcystoprostatefindings:33375}.  Imaging: Recent {bglistimagingtype:33376} reviewed - no GU abnormalities noted  Findings: ***  Assessment and Plan: ***  [name], MD 08/27/2024

## 2024-09-02 ENCOUNTER — Other Ambulatory Visit: Admitting: Urology

## 2024-10-07 NOTE — Progress Notes (Deleted)
   10/11/2024 8:36 AM   Joseph Strong July 04, 1971 [id number]  Cystoscopy Procedure Note:  Indication:  Chronic dysuria, LUTS, pelvic pain Robust negative lab + imaging workup Prior negative cysto in 2018  After informed consent and discussion of the procedure and its risks, Joseph Strong was positioned and prepped in the standard fashion. Cystoscopy was performed with a flexible cystoscope. The urethra, bladder neck and bladder mucosa were visualized in a systematic fashion. The ureteral orifices were noted in orthotopic location and orientation. There were no bladder mucosal lesions, stones, debris or anatomic variants noted. The prostate gland was {bglistcystoprostatefindings:33375}.  Imaging: CT Pelvis (Feb 2025) - no GU abnormalities  Findings: ***  Assessment and Plan: ***  Joseph Skye, MD 10/07/2024

## 2024-10-11 ENCOUNTER — Other Ambulatory Visit: Admitting: Urology

## 2024-10-11 DIAGNOSIS — N138 Other obstructive and reflux uropathy: Secondary | ICD-10-CM

## 2024-10-11 DIAGNOSIS — R300 Dysuria: Secondary | ICD-10-CM

## 2024-10-19 ENCOUNTER — Encounter: Payer: Self-pay | Admitting: Nurse Practitioner

## 2024-10-19 ENCOUNTER — Ambulatory Visit (INDEPENDENT_AMBULATORY_CARE_PROVIDER_SITE_OTHER): Admitting: Nurse Practitioner

## 2024-10-19 VITALS — BP 130/86 | HR 66 | Temp 97.9°F | Ht 72.0 in | Wt 223.0 lb

## 2024-10-19 DIAGNOSIS — J0140 Acute pansinusitis, unspecified: Secondary | ICD-10-CM

## 2024-10-19 DIAGNOSIS — G894 Chronic pain syndrome: Secondary | ICD-10-CM

## 2024-10-19 DIAGNOSIS — R030 Elevated blood-pressure reading, without diagnosis of hypertension: Secondary | ICD-10-CM

## 2024-10-19 DIAGNOSIS — Z8719 Personal history of other diseases of the digestive system: Secondary | ICD-10-CM

## 2024-10-19 DIAGNOSIS — Z9889 Other specified postprocedural states: Secondary | ICD-10-CM | POA: Diagnosis not present

## 2024-10-19 DIAGNOSIS — G5712 Meralgia paresthetica, left lower limb: Secondary | ICD-10-CM | POA: Insufficient documentation

## 2024-10-19 MED ORDER — AMOXICILLIN-POT CLAVULANATE 875-125 MG PO TABS: 1.0000 | ORAL_TABLET | Freq: Two times a day (BID) | ORAL | 0 refills | Status: DC

## 2024-10-19 NOTE — Patient Instructions (Signed)
 Start flonase  and zyrtec for sinus congestion, can get over the counter

## 2024-10-19 NOTE — Progress Notes (Signed)
 BP 130/86   Pulse 66   Temp 97.9 F (36.6 C)   Ht 6' (1.829 m)   Wt 223 lb (101.2 kg)   SpO2 98%   BMI 30.24 kg/m    Subjective:    Patient ID: Joseph Strong, male    DOB: [birth year], 53 y.o.   MRN: [id number]  HPI: Joseph Strong is a 53 y.o. male  Chief Complaint  Patient presents with   Medical Management of Chronic Issues    Pt c/o chronic left side abdominal pain   Discussed the use of AI scribe software for clinical note transcription with the patient, who gave verbal consent to proceed.  History of Present Illness Joseph Strong is a 53 year old male who presents for a follow-up regarding chronic pain and neurological symptoms.  Chronic left-sided abdominal pain - Chronic pain localized to the left side of the abdomen - History of surgical mesh placement - Multiple surgical evaluations completed - CT scan in February 2025 showed possible inguinal hernia changes but no recurrent hernia - Surgical evaluation repeated in August 2025 - Insurance denied CT scan ordered in April 2025 for ongoing symptoms  Left-sided neurological symptoms - Numbness, tingling, dizziness, and neuralgia affecting the left side - Neurology evaluation completed yesterday - Restarted on duloxetine  20 mg at bedtime, with plan to increase to 40 mg at bedtime - EMG planned for left arm and leg - Two brain MRIs performed in the past year - Previously trialed gabapentin  and pregabalin, discontinued due to adverse effects including tingling in the arm  Upper respiratory and sinus symptoms - Sinus symptoms present for several months, including drainage and nocturnal wheezing - No medication taken for sinus symptoms - Sensation of pressure and fluid behind the ear, attributed to sinus drainage  Medication intolerances and adverse effects - No allergies - Adverse reactions to gabapentin  and pregabalin, including tingling in the arm         10/19/2024    8:43 AM 03/04/2024    3:28 PM  02/02/2024    3:01 PM  Depression screen PHQ 2/9  Decreased Interest 0 0 0  Down, Depressed, Hopeless 0 0 0  PHQ - 2 Score 0 0 0    Relevant past medical, surgical, family and social history reviewed and updated as indicated. Interim medical history since our last visit reviewed. Allergies and medications reviewed and updated.  Review of Systems  Ten systems reviewed and is negative except as mentioned in HPI      Objective:      BP 130/86   Pulse 66   Temp 97.9 F (36.6 C)   Ht 6' (1.829 m)   Wt 223 lb (101.2 kg)   SpO2 98%   BMI 30.24 kg/m    Wt Readings from Last 3 Encounters:  10/19/24 223 lb (101.2 kg)  08/08/24 200 lb (90.7 kg)  06/28/24 203 lb (92.1 kg)    Physical Exam VITALS: BP- 130/86 GENERAL: Alert, cooperative, well developed, no acute distress HEENT: Normocephalic, normal oropharynx, moist mucous membranes, no sinus tenderness, throat appears erythematous, fluid behind the ear CHEST: Clear to auscultation bilaterally, no wheezes, rhonchi, or crackles CARDIOVASCULAR: Normal heart rate and rhythm, S1 and S2 normal without murmurs ABDOMEN: Soft, non-tender, non-distended, without organomegaly, normal bowel sounds EXTREMITIES: No cyanosis or edema NEUROLOGICAL: Cranial nerves grossly intact, moves all extremities without gross motor or sensory deficit  Results for orders placed or performed during the hospital encounter of 08/08/24  Lipase, blood  Collection Time: 08/08/24  7:14 AM  Result Value Ref Range   Lipase 32 11 - 51 U/L  Comprehensive metabolic panel   Collection Time: 08/08/24  7:14 AM  Result Value Ref Range   Sodium 137 135 - 145 mmol/L   Potassium 3.9 3.5 - 5.1 mmol/L   Chloride 105 98 - 111 mmol/L   CO2 22 22 - 32 mmol/L   Glucose, Bld 160 (H) 70 - 99 mg/dL   BUN 16 6 - 20 mg/dL   Creatinine, Ser 8.71 (H) 0.61 - 1.24 mg/dL   Calcium  8.7 (L) 8.9 - 10.3 mg/dL   Total Protein 6.3 (L) 6.5 - 8.1 g/dL   Albumin 3.3 (L) 3.5 - 5.0 g/dL    AST 27 15 - 41 U/L   ALT 30 0 - 44 U/L   Alkaline Phosphatase 51 38 - 126 U/L   Total Bilirubin 0.5 0.0 - 1.2 mg/dL   GFR, Estimated >39 >39 mL/min   Anion gap 10 5 - 15  CBC   Collection Time: 08/08/24  7:14 AM  Result Value Ref Range   WBC 5.3 4.0 - 10.5 K/uL   RBC 5.40 4.22 - 5.81 MIL/uL   Hemoglobin 15.1 13.0 - 17.0 g/dL   HCT 52.6 60.9 - 47.9 %   MCV 87.6 80.0 - 100.0 fL   MCH 28.0 26.0 - 34.0 pg   MCHC 31.9 30.0 - 36.0 g/dL   RDW 85.3 88.4 - 84.4 %   Platelets 195 150 - 400 K/uL   nRBC 0.0 0.0 - 0.2 %  Urinalysis, Routine w reflex microscopic -Urine, Clean Catch   Collection Time: 08/08/24  9:16 AM  Result Value Ref Range   Color, Urine COLORLESS (A) YELLOW   APPearance CLEAR (A) CLEAR   Specific Gravity, Urine 1.002 (L) 1.005 - 1.030   pH 8.0 5.0 - 8.0   Glucose, UA NEGATIVE NEGATIVE mg/dL   Hgb urine dipstick NEGATIVE NEGATIVE   Bilirubin Urine NEGATIVE NEGATIVE   Ketones, ur NEGATIVE NEGATIVE mg/dL   Protein, ur NEGATIVE NEGATIVE mg/dL   Nitrite NEGATIVE NEGATIVE   Leukocytes,Ua NEGATIVE NEGATIVE          Assessment & Plan:   Problem List Items Addressed This Visit       Nervous and Auditory   Meralgia paresthetica of left side     Other   Chronic pain syndrome (Chronic)   S/P unilateral inguinal hernia repair   Other Visit Diagnoses       Acute non-recurrent pansinusitis    -  Primary   Relevant Medications   amoxicillin -clavulanate (AUGMENTIN ) 875-125 MG tablet     Elevated blood pressure reading            Assessment and Plan Assessment & Plan Acute pansinusitis Sinus drainage and wheezing for a few months, with fluid behind the ear and raw throat. No prior treatment for sinuses. - Prescribed antibiotic for 10 days, once in the morning and once at night. - Recommended over-the-counter Flonase  nasal steroid. - Recommended over-the-counter Zyrtec to dry up sinus drainage.  Chronic pain syndrome with meralgia paresthetica, left lower  limb Chronic pain syndrome with meralgia paresthetica in the left lower limb. Neurology has restarted duloxetine  at 20 mg at bedtime, with plans to increase to 40 mg at bedtime. EMG planned for left arm and leg for thigh pain. Previous CT scan showed possible inguinal hernia changes but no recurrent hernia. MRI of pelvis ordered , also a referral to  pain  management was made by [facility] surgical - Continue duloxetine  as prescribed by neurology. - Proceed with EMG for left arm and leg.  Elevated blood pressure Blood pressure recorded at 130/86, indicating elevated blood pressure. - Recommended lifestyle modifications to manage blood pressure.        Follow up plan: Return if symptoms worsen or fail to improve.

## 2024-10-20 NOTE — Progress Notes (Deleted)
   10/27/2024 12:32 PM   Joseph Strong May 21, 1971 [id number]  Cystoscopy Procedure Note:  Indication:  Chronic dysuria, LUTS, pelvic pain Robust negative lab + imaging workup Prior negative cysto in 2018  After informed consent and discussion of the procedure and its risks, Chao Stipes was positioned and prepped in the standard fashion. Cystoscopy was performed with a flexible cystoscope. The urethra, bladder neck and bladder mucosa were visualized in a systematic fashion. The ureteral orifices were noted in orthotopic location and orientation. There were no bladder mucosal lesions, stones, debris or anatomic variants noted. The prostate gland was {bglistcystoprostatefindings:33375}.  Imaging: CT Pelvis (Feb 2025) - no GU abnormalities  Findings: ***  Assessment and Plan: - Follow up with PA McGowan  [name], MD 10/20/2024

## 2024-10-27 ENCOUNTER — Other Ambulatory Visit: Admitting: Urology

## 2024-11-04 ENCOUNTER — Ambulatory Visit: Payer: Self-pay

## 2024-11-04 NOTE — Telephone Encounter (Signed)
 FYI Only or Action Required?: FYI only for provider: appointment scheduled on 11/08/24.  Patient was last seen in primary care on 10/19/2024 by Gareth Mliss FALCON, FNP.  Called Nurse Triage reporting Arm Pain.  Symptoms began several months ago.  Interventions attempted: OTC medications: tylenol .  Symptoms are: unchanged.  Triage Disposition: See PCP When Office is Open (Within 3 Days)  Patient/caregiver understands and will follow disposition?: Yes   Copied from CRM #[id number]. Topic: Clinical - Red Word Triage >> Nov 04, 2024  2:15 PM Emylou G wrote: [facility] that prompted transfer to Nurse Triage: Injury on right arm from wrist to elbow - he fell about 4 months ago.. experiencing sensitivity, pain and numbness / some range of motion issues with bending it.. he is calling it bone contusions in that area Reason for Disposition  [1] MODERATE pain (e.g., interferes with normal activities) AND [2] present > 3 days  Answer Assessment - Initial Assessment Questions Scheduled alt prov 11/08/24 Advised call back or ED if symptoms worsen. Patient verbalized understanding.  1. ONSET: When did the pain start?     4 months ago 2. LOCATION: Where is the pain located?     Right arm 3. PAIN: How bad is the pain? (Scale 0-10; or none, mild, moderate, severe)     7/10; tylenol  4. WORK OR EXERCISE: Has there been any recent work or exercise that involved this part of the body?     injury 5. CAUSE: What do you think is causing the arm pain?     Fell on arm 4 months ago, seeing Orthopedic 6. OTHER SYMPTOMS: Do you have any other symptoms? (e.g., neck pain, swelling, rash, fever, numbness, weakness)  Denies chest pain, diff breathing, discolored ,swelling, rash, fever, numbness, weakness  Protocols used: Arm Pain-A-AH

## 2024-11-08 ENCOUNTER — Encounter: Payer: Self-pay | Admitting: Family Medicine

## 2024-11-08 ENCOUNTER — Ambulatory Visit: Admitting: Family Medicine

## 2024-11-08 VITALS — BP 122/78 | HR 93 | Resp 16 | Ht 72.0 in | Wt 221.0 lb

## 2024-11-08 DIAGNOSIS — M79601 Pain in right arm: Secondary | ICD-10-CM

## 2024-11-08 NOTE — Progress Notes (Signed)
 Acute Office Visit  Subjective:     Patient ID: Joseph Strong, male    DOB: [birth year], 53 y.o.   MRN: [id number]  Chief Complaint  Patient presents with   Arm Pain    Right, since August. Has had MRI and Xray due to work injury involving same arm/hand. Wanting 2nd opinion on arm pain.    HPI Patient is in today for complaints of right arm pain. He is a new patient to myself, however is established in office. He reports in August he fell while at work and landed on his right arm. He voices for the last 4 months he had been going through workers comp for his right arm pain, but he voices this has now ended. He voices was following with [facility] in [place], Dr. Alyse. He reports he has had imaging down including MRI and x-rays. He voices imaging showed a contusion but negative for other abnormalities. He voices his right arm is still painful and often aggravated by heavy lifting and other rigorous activity. He voices pain with palpation of the right lateral lower arm. He denies new falls or other injuries since August.   ROM intact, able to make a fist and rotate the wrist but voices pain radiating to the right forearm with rotation of the right wrist. Has right wrist and arm splint that he voices was provided by ortho.   Will request records from ortho because unfortunately records are not available for review today.  He asks for a work excuse with restrictions regarding heavy lifting.  Review of Systems  Musculoskeletal:  Positive for falls and joint pain.        Objective:    BP 122/78   Pulse 93   Resp 16   Ht 6' (1.829 m)   Wt 221 lb (100.2 kg)   SpO2 99%   BMI 29.97 kg/m    Physical Exam Constitutional:      General: He is not in acute distress.    Appearance: Normal appearance.  Cardiovascular:     Rate and Rhythm: Normal rate and regular rhythm.  Pulmonary:     Effort: Pulmonary effort is normal.     Breath sounds: Normal breath sounds.   Musculoskeletal:     Right forearm: Tenderness present. No swelling, edema, deformity or lacerations.  Skin:    General: Skin is warm and dry.  Neurological:     General: No focal deficit present.     Mental Status: He is alert.       Assessment & Plan:   1. Right arm pain (Primary) Right arm pain, specifically forearm, starting in August after a fall at work. Per patient report he was seen by [facility] in [place] and had MRI and x-ray imaging down which he reports found a contusion otherwise negative for other acute abnormalities. He had been cleared from workers comp and ortho but states he is still having pain and discomfort, requesting a second opinion. No obvious deformities or other palpable abnormalities on exam today, however patient does report often he can feel a knot on the lateral region of the right forearm. No swelling, bruising or other discoloration present at time of exam. Unfortunately I cannot view records with [facility] but will attempt to request and obtain records. Will refer to ortho for second opinion per patient request.   -Per patient request work restrictions given for two weeks stating he cannot lift or pull anything greater than 10lbs pending ortho re-evaluation. -F/u  with PCP in the next 4 weeks  - Ambulatory referral to Orthopedic Surgery    Return in about 4 weeks (around 12/06/2024) for follow up with PCP.  LAYMON LOISE CORE, FNP

## 2024-11-09 ENCOUNTER — Encounter (HOSPITAL_BASED_OUTPATIENT_CLINIC_OR_DEPARTMENT_OTHER): Payer: Self-pay

## 2024-11-10 ENCOUNTER — Ambulatory Visit

## 2024-12-08 NOTE — Progress Notes (Unsigned)
"  ° ° °  Joseph Strong 07/28/1971 [id number]   Cystoscopy Procedure Note:   Indication:  Chronic dysuria, LUTS, pelvic pain Robust negative lab + imaging workup Prior negative cysto in 2018   After informed consent and discussion of the procedure and its risks, Joseph Strong was positioned and prepped in the standard fashion. Cystoscopy was performed with a flexible cystoscope. The urethra, bladder neck and bladder mucosa were visualized in a systematic fashion. The ureteral orifices were noted in orthotopic location and orientation. There were no bladder mucosal lesions, stones, debris or anatomic variants noted. The prostate gland was {bglistcystoprostatefindings:33375}.   Imaging: CT Pelvis (Feb 2025) - no GU abnormalities   Findings: ***   Assessment and Plan: - Follow up with PA McGowan   [name], MD 10/20/2024   "

## 2024-12-10 ENCOUNTER — Ambulatory Visit: Admitting: Internal Medicine

## 2024-12-14 ENCOUNTER — Encounter: Payer: Self-pay | Admitting: Urology

## 2024-12-15 ENCOUNTER — Other Ambulatory Visit: Admitting: Urology

## 2024-12-21 ENCOUNTER — Other Ambulatory Visit: Payer: Self-pay | Admitting: Urology

## 2025-03-07 ENCOUNTER — Encounter: Admitting: Internal Medicine

## 2025-06-15 ENCOUNTER — Other Ambulatory Visit: Admitting: Urology
